# Patient Record
Sex: Female | Born: 1995 | Race: Black or African American | Hispanic: No | Marital: Single | Smoking: Former smoker
Health system: Southern US, Community
[De-identification: ages and names within clinical notes are randomized; demographics above are authoritative.]

## PROBLEM LIST (undated history)

## (undated) DIAGNOSIS — F4310 Post-traumatic stress disorder, unspecified: Secondary | ICD-10-CM

## (undated) DIAGNOSIS — F909 Attention-deficit hyperactivity disorder, unspecified type: Secondary | ICD-10-CM

## (undated) DIAGNOSIS — F32A Depression, unspecified: Secondary | ICD-10-CM

## (undated) DIAGNOSIS — F329 Major depressive disorder, single episode, unspecified: Secondary | ICD-10-CM

## (undated) DIAGNOSIS — F419 Anxiety disorder, unspecified: Secondary | ICD-10-CM

## (undated) DIAGNOSIS — K5909 Other constipation: Secondary | ICD-10-CM

## (undated) HISTORY — PX: WISDOM TOOTH EXTRACTION: SHX21

## (undated) HISTORY — DX: Anxiety disorder, unspecified: F41.9

## (undated) HISTORY — PX: EXTERNAL EAR SURGERY: SHX627

## (undated) HISTORY — PX: OTHER SURGICAL HISTORY: SHX169

## (undated) HISTORY — DX: Other constipation: K59.09

## (undated) HISTORY — DX: Depression, unspecified: F32.A

## (undated) HISTORY — DX: Post-traumatic stress disorder, unspecified: F43.10

---
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
## 2013-02-23 ENCOUNTER — Emergency Department (HOSPITAL_COMMUNITY)
Admission: EM | Admit: 2013-02-23 | Discharge: 2013-02-23 | Disposition: A | Discharge status: Home / Self Care | Payer: Medicaid Other | Attending: Emergency Medicine | Admitting: Emergency Medicine

## 2013-02-23 ENCOUNTER — Encounter (HOSPITAL_COMMUNITY): Payer: Self-pay | Admitting: Family Medicine

## 2013-02-23 DIAGNOSIS — Z79899 Other long term (current) drug therapy: Secondary | ICD-10-CM | POA: Insufficient documentation

## 2013-02-23 DIAGNOSIS — Z3202 Encounter for pregnancy test, result negative: Secondary | ICD-10-CM | POA: Insufficient documentation

## 2013-02-23 DIAGNOSIS — K649 Unspecified hemorrhoids: Secondary | ICD-10-CM

## 2013-02-23 DIAGNOSIS — F909 Attention-deficit hyperactivity disorder, unspecified type: Secondary | ICD-10-CM | POA: Insufficient documentation

## 2013-02-23 DIAGNOSIS — K644 Residual hemorrhoidal skin tags: Secondary | ICD-10-CM | POA: Insufficient documentation

## 2013-02-23 DIAGNOSIS — K5900 Constipation, unspecified: Secondary | ICD-10-CM | POA: Insufficient documentation

## 2013-02-23 DIAGNOSIS — K6289 Other specified diseases of anus and rectum: Secondary | ICD-10-CM | POA: Insufficient documentation

## 2013-02-23 DIAGNOSIS — J45909 Unspecified asthma, uncomplicated: Secondary | ICD-10-CM | POA: Insufficient documentation

## 2013-02-23 LAB — URINALYSIS, ROUTINE W REFLEX MICROSCOPIC
Bilirubin Urine: NEGATIVE
Glucose, UA: NEGATIVE mg/dL
Hgb urine dipstick: NEGATIVE
Leukocytes, UA: NEGATIVE
Nitrite: NEGATIVE
Specific Gravity, Urine: 1.039 — ABNORMAL HIGH (ref 1.005–1.030)
pH: 6 (ref 5.0–8.0)

## 2013-02-23 LAB — COMPREHENSIVE METABOLIC PANEL
ALT: 23 U/L (ref 0–35)
AST: 24 U/L (ref 0–37)
Albumin: 3.4 g/dL — ABNORMAL LOW (ref 3.5–5.2)
CO2: 27 mEq/L (ref 19–32)
Calcium: 9.3 mg/dL (ref 8.4–10.5)
Chloride: 103 mEq/L (ref 96–112)
Creatinine, Ser: 0.78 mg/dL (ref 0.50–1.10)
GFR calc Af Amer: >90 mL/min (ref >90)
GFR calc non Af Amer: >90 mL/min (ref >90)
Glucose, Bld: 78 mg/dL (ref 70–99)
Potassium: 3.8 mEq/L (ref 3.5–5.1)
Sodium: 140 mEq/L (ref 135–145)
Total Protein: 7.5 g/dL (ref 6.0–8.3)

## 2013-02-23 LAB — CBC WITH DIFFERENTIAL/PLATELET
Basophils Absolute: 0 10*3/uL (ref 0.0–0.1)
Basophils Relative: 0 % (ref 0–1)
Eosinophils Absolute: 0.2 10*3/uL (ref 0.0–0.7)
Eosinophils Relative: 2 % (ref 0–5)
Lymphocytes Relative: 19 % (ref 12–46)
MCH: 30.2 pg (ref 26.0–34.0)
MCHC: 33.9 g/dL (ref 30.0–36.0)
MCV: 89.1 fL (ref 78.0–100.0)
Monocytes Absolute: 0.8 10*3/uL (ref 0.1–1.0)
Monocytes Relative: 7 % (ref 3–12)
Neutrophils Relative %: 72 % (ref 43–77)
Platelets: 254 10*3/uL (ref 150–400)
RDW: 12.9 % (ref 11.5–15.5)
WBC: 11 10*3/uL — ABNORMAL HIGH (ref 4.0–10.5)

## 2013-02-23 NOTE — ED Provider Notes (Signed)
17 year old female has noted rectal pain for the last 2 weeks. Symptoms have been stable. She does have problems with chronic constipation and straining at her school. On exam, she is morbidly obese. External hemorrhoid is present which is not thrombosed it is moderately tender. She will be discharged with instructions use hydrocortisone cream and is given instructions on increasing fiber in her diet.  I saw and evaluated the patient, reviewed the resident's note and I agree with the findings and plan.   Dione Booze, MD 02/23/13 2148

## 2013-02-23 NOTE — ED Provider Notes (Signed)
History     CSN: [id number]  Arrival date & time 02/23/13  1514   First MD Initiated Contact with Patient 02/23/13 2003      Chief Complaint  Patient presents with  . Abscess    (Consider location/radiation/quality/duration/timing/severity/associated sxs/prior treatment) HPI Comments: 17 y.o. female who presents to the ER w/ the concern she has an abscess on her anus. She states she has had hard stools, no blood that has been noticed. No abdominal pain. No fevers.   Patient is a 17 y.o. female presenting with general illness. The history is provided by the patient.  Illness Location:  Anus Severity:  Mild Onset quality:  Gradual Timing:  Constant Progression:  Worsening Chronicity:  New Associated symptoms: no abdominal pain, no chest pain, no congestion, no cough, no diarrhea, no fatigue, no fever, no headaches, no rash, no vomiting and no wheezing     Past Medical History  Diagnosis Date  . Attention deficit hyperactivity disorder   . Chronic constipation   . Chest pain   . Asthma     Past Surgical History  Procedure Laterality Date  . External ear surgery      History reviewed. No pertinent family history.  History  Substance Use Topics  . Smoking status: Never Smoker   . Smokeless tobacco: Not on file  . Alcohol Use: No    OB History   Grav Para Term Preterm Abortions TAB SAB Ect Mult Living                  Review of Systems  Constitutional: Negative for fever, chills and fatigue.  HENT: Negative for congestion, facial swelling, drooling, neck pain and dental problem.   Eyes: Negative for pain, discharge and itching.  Respiratory: Negative for cough, choking, wheezing and stridor.   Cardiovascular: Negative for chest pain.  Gastrointestinal: Negative for vomiting, abdominal pain and diarrhea.  Endocrine: Negative for cold intolerance and heat intolerance.  Genitourinary: Negative for vaginal discharge, difficulty urinating and vaginal pain.  Skin:  Negative for pallor and rash.  Neurological: Negative for dizziness, light-headedness and headaches.  Psychiatric/Behavioral: Negative for behavioral problems and agitation.    Allergies  Review of patient's allergies indicates no known allergies.  Home Medications   Current Outpatient Rx  Name  Route  Sig  Dispense  Refill  . albuterol (PROVENTIL HFA;VENTOLIN HFA) 108 (90 BASE) MCG/ACT inhaler   Inhalation   Inhale 2 puffs into the lungs every 6 (six) hours as needed for wheezing or shortness of breath.         . cetirizine (ZYRTEC) 10 MG tablet   Oral   Take 10 mg by mouth daily.           . cholecalciferol (VITAMIN D) 1000 UNITS tablet   Oral   Take 1,000 Units by mouth daily.           . methylphenidate (CONCERTA) 36 MG CR tablet   Oral   Take 36 mg by mouth every morning.           . norgestimate-ethinyl estradiol (ORTHO-CYCLEN,SPRINTEC,PREVIFEM) 0.25-35 MG-MCG tablet   Oral   Take 1 tablet by mouth daily.           Marland Kitchen omeprazole (PRILOSEC) 20 MG capsule   Oral   Take 20 mg by mouth daily.             BP 128/69  Pulse 68  Temp(Src) 98.4 F (36.9 C) (Oral)  Resp 19  Ht  5' 8.5" (1.74 m)  Wt 303 lb (137.44 kg)  BMI 45.4 kg/m2  SpO2 99%  LMP 02/03/2013  Physical Exam  Constitutional: She is oriented to person, place, and time. She appears well-developed. No distress.  HENT:  Head: Normocephalic and atraumatic.  Eyes: Pupils are equal, round, and reactive to light. Right eye exhibits no discharge. Left eye exhibits no discharge.  Neck: Neck supple. No tracheal deviation present.  Cardiovascular: Normal rate.  Exam reveals no gallop and no friction rub.   Pulmonary/Chest: No stridor. No respiratory distress. She has no wheezes.  Abdominal: Soft. She exhibits no distension. There is no tenderness. There is no rebound.  Genitourinary:  Normal rectal tone. External hemorrhoid, not thrombosed. No internal hemorrhoid appreciated. No blood in stool     Musculoskeletal: She exhibits no edema and no tenderness.  Neurological: She is alert and oriented to person, place, and time.  Skin: Skin is warm. She is not diaphoretic.    ED Course  Procedures (including critical care time)  Labs Reviewed  CBC WITH DIFFERENTIAL - Abnormal; Notable for the following:    WBC 11.0 (*)    Neutro Abs 7.9 (*)    All other components within normal limits  COMPREHENSIVE METABOLIC PANEL - Abnormal; Notable for the following:    Albumin 3.4 (*)    Alkaline Phosphatase 120 (*)    All other components within normal limits  URINALYSIS, ROUTINE W REFLEX MICROSCOPIC - Abnormal; Notable for the following:    APPearance HAZY (*)    Specific Gravity, Urine 1.039 (*)    All other components within normal limits  POCT PREGNANCY, URINE   No results found.    MDM  Pt with hemorrhoid -- no signs of abscess.. External. Not thrombosed. Have given pt instructions on how to tx. Have told to f/u with pcp, have encouraged high fiber diet. Have given precautions and indications to return to the ER.   1. Hemorrhoid           Dorothe Elmore Donette Larry, MD 02/25/13 [phone]

## 2013-02-23 NOTE — ED Notes (Signed)
Pt reassessed and states vomiting, diarrhea, abdominal pain, knee pain, and abscess to buttocks.

## 2013-02-23 NOTE — ED Notes (Signed)
Per pt abscess on her bottom and some N,V,D

## 2013-02-23 NOTE — ED Notes (Signed)
The patient is AOx4 and comfortable with the discharge instructions. 

## 2014-02-05 ENCOUNTER — Emergency Department (HOSPITAL_COMMUNITY): Payer: Medicaid Other

## 2014-02-05 ENCOUNTER — Encounter (HOSPITAL_COMMUNITY): Payer: Self-pay | Admitting: Emergency Medicine

## 2014-02-05 ENCOUNTER — Emergency Department (HOSPITAL_COMMUNITY)
Admission: EM | Admit: 2014-02-05 | Discharge: 2014-02-05 | Disposition: A | Discharge status: Home / Self Care | Payer: Medicaid Other | Attending: Emergency Medicine | Admitting: Emergency Medicine

## 2014-02-05 DIAGNOSIS — Z3202 Encounter for pregnancy test, result negative: Secondary | ICD-10-CM | POA: Insufficient documentation

## 2014-02-05 DIAGNOSIS — R0789 Other chest pain: Secondary | ICD-10-CM

## 2014-02-05 DIAGNOSIS — F909 Attention-deficit hyperactivity disorder, unspecified type: Secondary | ICD-10-CM | POA: Insufficient documentation

## 2014-02-05 DIAGNOSIS — Z8719 Personal history of other diseases of the digestive system: Secondary | ICD-10-CM | POA: Insufficient documentation

## 2014-02-05 DIAGNOSIS — R071 Chest pain on breathing: Secondary | ICD-10-CM | POA: Insufficient documentation

## 2014-02-05 DIAGNOSIS — J45909 Unspecified asthma, uncomplicated: Secondary | ICD-10-CM | POA: Insufficient documentation

## 2014-02-05 DIAGNOSIS — Z79899 Other long term (current) drug therapy: Secondary | ICD-10-CM | POA: Insufficient documentation

## 2014-02-05 DIAGNOSIS — R1110 Vomiting, unspecified: Secondary | ICD-10-CM | POA: Insufficient documentation

## 2014-02-05 LAB — URINE MICROSCOPIC-ADD ON

## 2014-02-05 LAB — URINALYSIS, ROUTINE W REFLEX MICROSCOPIC
Glucose, UA: NEGATIVE mg/dL
Ketones, ur: 15 mg/dL — AB
Leukocytes, UA: NEGATIVE
Nitrite: NEGATIVE
Protein, ur: 30 mg/dL — AB
Specific Gravity, Urine: 1.037 — ABNORMAL HIGH (ref 1.005–1.030)
Urobilinogen, UA: 4 mg/dL — ABNORMAL HIGH (ref 0.0–1.0)
pH: 7.5 (ref 5.0–8.0)

## 2014-02-05 LAB — COMPREHENSIVE METABOLIC PANEL WITH GFR
Albumin: 3.4 g/dL — ABNORMAL LOW (ref 3.5–5.2)
Alkaline Phosphatase: 111 U/L (ref 39–117)
BUN: 12 mg/dL (ref 6–23)
Calcium: 8.8 mg/dL (ref 8.4–10.5)
Creatinine, Ser: 0.92 mg/dL (ref 0.50–1.10)
GFR calc Af Amer: >90 mL/min (ref >90)
Glucose, Bld: 91 mg/dL (ref 70–99)
Potassium: 4.2 meq/L (ref 3.7–5.3)
Total Protein: 7 g/dL (ref 6.0–8.3)

## 2014-02-05 LAB — CBC WITH DIFFERENTIAL/PLATELET
Basophils Absolute: 0 10*3/uL (ref 0.0–0.1)
Basophils Relative: 0 % (ref 0–1)
Eosinophils Absolute: 0.1 K/uL (ref 0.0–0.7)
Eosinophils Relative: 2 % (ref 0–5)
HCT: 39.5 % (ref 36.0–46.0)
Hemoglobin: 13.2 g/dL (ref 12.0–15.0)
Lymphocytes Relative: 22 % (ref 12–46)
Lymphs Abs: 1.9 10*3/uL (ref 0.7–4.0)
MCH: 30.5 pg (ref 26.0–34.0)
MCHC: 33.4 g/dL (ref 30.0–36.0)
MCV: 91.2 fL (ref 78.0–100.0)
Monocytes Absolute: 0.6 10*3/uL (ref 0.1–1.0)
Monocytes Relative: 7 % (ref 3–12)
Neutro Abs: 6.1 10*3/uL (ref 1.7–7.7)
Neutrophils Relative %: 69 % (ref 43–77)
Platelets: 250 K/uL (ref 150–400)
RBC: 4.33 MIL/uL (ref 3.87–5.11)
RDW: 12.8 % (ref 11.5–15.5)
WBC: 8.7 10*3/uL (ref 4.0–10.5)

## 2014-02-05 LAB — COMPREHENSIVE METABOLIC PANEL
ALT: 41 U/L — ABNORMAL HIGH (ref 0–35)
AST: 56 U/L — ABNORMAL HIGH (ref 0–37)
CO2: 24 mEq/L (ref 19–32)
Chloride: 104 mEq/L (ref 96–112)
GFR calc non Af Amer: 90 mL/min — ABNORMAL LOW (ref >90)
Sodium: 140 mEq/L (ref 137–147)
Total Bilirubin: 0.5 mg/dL (ref 0.3–1.2)

## 2014-02-05 LAB — LIPASE, BLOOD: Lipase: 29 U/L (ref 11–59)

## 2014-02-05 LAB — PREGNANCY, URINE: Preg Test, Ur: NEGATIVE

## 2014-02-05 MED ORDER — IBUPROFEN 800 MG PO TABS
800.0000 mg | ORAL_TABLET | Freq: Once | ORAL | Status: AC
  Administered 2014-02-05: 800 mg via ORAL
  Filled 2014-02-05: qty 1

## 2014-02-05 MED ORDER — SODIUM CHLORIDE 0.9 % IV BOLUS (SEPSIS)
1000.0000 mL | Freq: Once | INTRAVENOUS | Status: AC
  Administered 2014-02-05: 1000 mL via INTRAVENOUS

## 2014-02-05 MED ORDER — ONDANSETRON HCL 4 MG/2ML IJ SOLN
4.0000 mg | Freq: Once | INTRAMUSCULAR | Status: AC
  Administered 2014-02-05: 4 mg via INTRAVENOUS
  Filled 2014-02-05: qty 2

## 2014-02-05 MED ORDER — ONDANSETRON 4 MG PO TBDP
8.0000 mg | ORAL_TABLET | Freq: Once | ORAL | Status: AC
  Administered 2014-02-05: 8 mg via ORAL
  Filled 2014-02-05: qty 2

## 2014-02-05 MED ORDER — PROMETHAZINE HCL 25 MG PO TABS: 25.0000 mg | ORAL_TABLET | Freq: Three times a day (TID) | ORAL | Status: DC | PRN

## 2014-02-05 MED ORDER — HYDROCODONE-ACETAMINOPHEN 5-325 MG PO TABS
1.0000 | ORAL_TABLET | Freq: Once | ORAL | Status: AC
  Administered 2014-02-05: 1 via ORAL
  Filled 2014-02-05: qty 1

## 2014-02-05 MED ORDER — HYDROCODONE-ACETAMINOPHEN 5-325 MG PO TABS: 1.0000 | ORAL_TABLET | Freq: Four times a day (QID) | ORAL | Status: DC | PRN

## 2014-02-05 MED ORDER — IBUPROFEN 800 MG PO TABS: 800.0000 mg | ORAL_TABLET | Freq: Three times a day (TID) | ORAL | Status: DC | PRN

## 2014-02-05 NOTE — ED Provider Notes (Signed)
CSN: [id number]     Arrival date & time 02/05/14  [id number] History   First MD Initiated Contact with Patient 02/05/14 [phone]     Chief Complaint  Patient presents with  . Chest Pain     (Consider location/radiation/quality/duration/timing/severity/associated sxs/prior Treatment) HPI Patient presents to the emergency department with right-sided chest discomfort that her to 4 hours ago.  Patient, states, that she's having lower rib pain on the right.  She denies diarrhea, headache, blurred vision, weakness, numbness, dizziness, back pain shortness of breath, dysuria abdominal pain, fever, cough, runny nose, sore throat, rash, or syncope.  Patient, states, that she awoke this morning with sharp, right-sided chest pain in the lower part of her chest.  Patient, states it hurts more when she moves, takes a deep breath or coughs.  Patient, states the symptoms have been persistent Past Medical History  Diagnosis Date  . Attention deficit hyperactivity disorder   . Chronic constipation   . Chest pain   . Asthma    Past Surgical History  Procedure Laterality Date  . External ear surgery     No family history on file. History  Substance Use Topics  . Smoking status: Never Smoker   . Smokeless tobacco: Not on file  . Alcohol Use: No   OB History   Grav Para Term Preterm Abortions TAB SAB Ect Mult Living                 Review of Systems  All other systems negative except as documented in the HPI. All pertinent positives and negatives as reviewed in the HPI.  Allergies  Review of patient's allergies indicates no known allergies.  Home Medications   Prior to Admission medications   Medication Sig Start Date End Date Taking? Authorizing Provider  albuterol (PROVENTIL HFA;VENTOLIN HFA) 108 (90 BASE) MCG/ACT inhaler Inhale 2 puffs into the lungs every 6 (six) hours as needed for wheezing or shortness of breath.   Yes Historical Provider, MD  cetirizine (ZYRTEC) 10 MG tablet Take 10 mg by mouth  daily.     Yes Historical Provider, MD  cholecalciferol (VITAMIN D) 1000 UNITS tablet Take 1,000 Units by mouth daily.     Yes Historical Provider, MD  methylphenidate (CONCERTA) 36 MG CR tablet Take 36 mg by mouth every morning.     Yes Historical Provider, MD  norgestimate-ethinyl estradiol (ORTHO-CYCLEN,SPRINTEC,PREVIFEM) 0.25-35 MG-MCG tablet Take 1 tablet by mouth daily.     Yes Historical Provider, MD  omeprazole (PRILOSEC) 20 MG capsule Take 20 mg by mouth daily.     Yes Historical Provider, MD   BP 134/70  Pulse 63  Temp(Src) 97.4 F (36.3 C) (Oral)  Resp 13  Ht 5\' 7"  (1.702 m)  Wt 330 lb (149.687 kg)  BMI 51.67 kg/m2  SpO2 100%  LMP 01/29/2014 Physical Exam  Nursing note and vitals reviewed. Constitutional: She is oriented to person, place, and time. She appears well-developed and well-nourished. No distress.  HENT:  Head: Normocephalic and atraumatic.  Mouth/Throat: Oropharynx is clear and moist.  Eyes: Conjunctivae are normal. Pupils are equal, round, and reactive to light.  Neck: Normal range of motion. Neck supple.  Cardiovascular: Normal rate, regular rhythm and normal heart sounds.  Exam reveals no gallop and no friction rub.   No murmur heard. Pulmonary/Chest: Effort normal. No respiratory distress. She has no wheezes. She has no rales. She exhibits tenderness.  Abdominal: Soft. Bowel sounds are normal. She exhibits no distension. There is no tenderness. There  is no rebound.  Musculoskeletal: She exhibits no edema.  Neurological: She is alert and oriented to person, place, and time. She exhibits normal muscle tone. Coordination normal.  Skin: Skin is warm and dry. No rash noted.  Psychiatric: She has a normal mood and affect. Her behavior is normal. Judgment and thought content normal.    ED Course  Procedures (including critical care time) Labs Review Labs Reviewed  COMPREHENSIVE METABOLIC PANEL - Abnormal; Notable for the following:    Albumin 3.4 (*)    AST  56 (*)    ALT 41 (*)    GFR calc non Af Amer 90 (*)    All other components within normal limits  CBC WITH DIFFERENTIAL  LIPASE, BLOOD    Imaging Review Dg Chest 2 View  02/05/2014   CLINICAL DATA:  Chest pain.  EXAM: CHEST  2 VIEW  COMPARISON:  DG CHEST 2 VIEW dated 09/02/2011  FINDINGS: Mediastinum and hilar structures are normal. On lateral view increased density noted over the posterior chest. This may just represent overlying prominent soft tissues however focal pulmonary infiltrate or posterior chest mass cannot be excluded. A standard upright PA and lateral chest x-ray suggested for further evaluation. Borderline cardiomegaly, normal pulmonary vascularity. No pleural effusion or pneumothorax. No acute bony abnormality.  IMPRESSION: 1. Increased density noted along the posterior chest. This may just represent overlying soft tissues. However a pulmonary infiltrate or soft tissue chest wall mass cannot be excluded. A standard upright PA and lateral chest x-ray is suggested for further evaluation. 2. Borderline cardiomegaly.  This may be related to AP technique.   Electronically Signed   By: Maisie Fushomas  Register   On: 02/05/2014 08:54     EKG Interpretation   Date/Time:  Sunday Feb 05 2014 06:56:22 EDT Ventricular Rate:  62 PR Interval:  164 QRS Duration: 90 QT Interval:  426 QTC Calculation: 432 R Axis:   52 Text Interpretation:  Normal sinus rhythm Normal ECG ED PHYSICIAN  INTERPRETATION AVAILABLE IN [facility] Confirmed by TEST, Record  ([id number]) on 02/07/2014 7:26:29 AM       Patient most likely has chest wall pain, based on her physical exam findings, and history of present illness.  The patient had no cough, or fever.  No recent URI symptoms.  Patient does not have any abdominal pain.  She has had some vomiting here in the emergency department.  He was given IV fluids and antiemetics and is feeling better at this time. able to tolerate oral fluids patient is given her test results,  and all questions were answered.  The patient.  Voices an understanding of plan to return here as needed and to follow up with her primary care Dr. for recheck  Carlyle Dollyhristopher W Tarren Velardi, PA-C 02/08/14 [phone]

## 2014-02-05 NOTE — ED Notes (Addendum)
Pt c/o left upper chest pain that started 4 hours ago and upper and lower back pain. Pt sts her back pain is separate than her chest pain. sts everything started while she was sleeping and it woke her up from sleep. sts she vomited twice after waking up, denies nausea now. Pt reluctant to answer questions because she is trying to watch a tv show. Father at bedside trying to get pt to answer questions. Denies sob. Nad, skin warm and dry, resp e/u.

## 2014-02-05 NOTE — ED Notes (Signed)
Pt requesting nausea medicine. PA notified.

## 2014-02-05 NOTE — ED Notes (Signed)
Attempted to gain IV access, unsuccessful. Pt reports she is unable to give a urine sample at this time.

## 2014-02-05 NOTE — Discharge Instructions (Signed)
Return here as needed. Use ice and heat on your ribs

## 2014-02-05 NOTE — ED Notes (Signed)
The pt is c/o rt sided chest pain for 4 hours that radiates into her rt posterior chest.  Sob nv .  None now.  The pain increases with respiration.  lmp this past week

## 2014-02-09 NOTE — ED Provider Notes (Signed)
Medical screening examination/treatment/procedure(s) were performed by non-physician practitioner and as supervising physician I was immediately available for consultation/collaboration.   EKG Interpretation   Date/Time:  Sunday Feb 05 2014 06:56:22 EDT Ventricular Rate:  62 PR Interval:  164 QRS Duration: 90 QT Interval:  426 QTC Calculation: 432 R Axis:   52 Text Interpretation:  Normal sinus rhythm Normal ECG ED PHYSICIAN  INTERPRETATION AVAILABLE IN [facility] Confirmed by TEST, Record  ([id number]) on 02/07/2014 7:26:29 AM        Gavin PoundMichael Y. Oletta LamasGhim, MD 02/09/14 [id number]

## 2014-02-16 ENCOUNTER — Other Ambulatory Visit: Payer: Self-pay | Admitting: Pediatrics

## 2014-02-16 ENCOUNTER — Ambulatory Visit
Admission: RE | Admit: 2014-02-16 | Discharge: 2014-02-16 | Disposition: A | Discharge status: Home / Self Care | Payer: Medicaid Other | Source: Ambulatory Visit | Attending: Pediatrics | Admitting: Pediatrics

## 2014-02-16 DIAGNOSIS — R918 Other nonspecific abnormal finding of lung field: Secondary | ICD-10-CM

## 2016-03-10 ENCOUNTER — Encounter (HOSPITAL_COMMUNITY): Payer: Self-pay | Admitting: Emergency Medicine

## 2016-03-10 ENCOUNTER — Emergency Department (HOSPITAL_COMMUNITY)
Admission: EM | Admit: 2016-03-10 | Discharge: 2016-03-10 | Disposition: A | Discharge status: Home / Self Care | Payer: Medicaid Other | Attending: Emergency Medicine | Admitting: Emergency Medicine

## 2016-03-10 ENCOUNTER — Emergency Department (HOSPITAL_COMMUNITY): Payer: Medicaid Other

## 2016-03-10 DIAGNOSIS — S0990XA Unspecified injury of head, initial encounter: Secondary | ICD-10-CM | POA: Diagnosis not present

## 2016-03-10 DIAGNOSIS — Y92410 Unspecified street and highway as the place of occurrence of the external cause: Secondary | ICD-10-CM | POA: Diagnosis not present

## 2016-03-10 DIAGNOSIS — J45909 Unspecified asthma, uncomplicated: Secondary | ICD-10-CM | POA: Insufficient documentation

## 2016-03-10 DIAGNOSIS — R531 Weakness: Secondary | ICD-10-CM | POA: Insufficient documentation

## 2016-03-10 DIAGNOSIS — Y9389 Activity, other specified: Secondary | ICD-10-CM | POA: Diagnosis not present

## 2016-03-10 DIAGNOSIS — S29001A Unspecified injury of muscle and tendon of front wall of thorax, initial encounter: Secondary | ICD-10-CM | POA: Insufficient documentation

## 2016-03-10 DIAGNOSIS — Z8659 Personal history of other mental and behavioral disorders: Secondary | ICD-10-CM | POA: Insufficient documentation

## 2016-03-10 DIAGNOSIS — Z3202 Encounter for pregnancy test, result negative: Secondary | ICD-10-CM | POA: Diagnosis not present

## 2016-03-10 DIAGNOSIS — Z79899 Other long term (current) drug therapy: Secondary | ICD-10-CM | POA: Diagnosis not present

## 2016-03-10 DIAGNOSIS — Y998 Other external cause status: Secondary | ICD-10-CM | POA: Diagnosis not present

## 2016-03-10 DIAGNOSIS — Z8719 Personal history of other diseases of the digestive system: Secondary | ICD-10-CM | POA: Diagnosis not present

## 2016-03-10 LAB — CBG MONITORING, ED: Glucose-Capillary: 72 mg/dL (ref 65–99)

## 2016-03-10 LAB — POC URINE PREG, ED: PREG TEST UR: NEGATIVE
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
## 2016-03-10 NOTE — Discharge Instructions (Signed)
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
## 2016-03-10 NOTE — ED Notes (Signed)
Checked patient blood sugar it was 72 notified RN of blood sugar 

## 2016-03-10 NOTE — ED Notes (Signed)
MD at bedside. 

## 2016-06-13 ENCOUNTER — Encounter (HOSPITAL_COMMUNITY): Payer: Self-pay | Admitting: Physical Medicine and Rehabilitation

## 2016-06-13 ENCOUNTER — Emergency Department (HOSPITAL_COMMUNITY)
Admission: EM | Admit: 2016-06-13 | Discharge: 2016-06-13 | Disposition: A | Discharge status: Home / Self Care | Payer: Medicaid Other | Attending: Emergency Medicine | Admitting: Emergency Medicine

## 2016-06-13 DIAGNOSIS — T162XXA Foreign body in left ear, initial encounter: Secondary | ICD-10-CM | POA: Insufficient documentation

## 2016-06-13 DIAGNOSIS — J45909 Unspecified asthma, uncomplicated: Secondary | ICD-10-CM | POA: Insufficient documentation

## 2016-06-13 DIAGNOSIS — Y939 Activity, unspecified: Secondary | ICD-10-CM | POA: Diagnosis not present

## 2016-06-13 DIAGNOSIS — F909 Attention-deficit hyperactivity disorder, unspecified type: Secondary | ICD-10-CM | POA: Diagnosis not present

## 2016-06-13 DIAGNOSIS — X58XXXA Exposure to other specified factors, initial encounter: Secondary | ICD-10-CM | POA: Diagnosis not present

## 2016-06-13 DIAGNOSIS — Y999 Unspecified external cause status: Secondary | ICD-10-CM | POA: Diagnosis not present

## 2016-06-13 DIAGNOSIS — Y929 Unspecified place or not applicable: Secondary | ICD-10-CM | POA: Diagnosis not present

## 2016-06-13 MED ORDER — DOCUSATE SODIUM 50 MG/5ML PO LIQD
10.0000 mg | Freq: Once | ORAL | Status: AC
  Administered 2016-06-13: 10 mg via OTIC
  Filled 2016-06-13: qty 10

## 2016-06-13 MED ORDER — CIPROFLOXACIN-DEXAMETHASONE 0.3-0.1 % OT SUSP: 4.0000 [drp] | Freq: Two times a day (BID) | OTIC | 0 refills | Status: AC

## 2016-06-13 NOTE — Discharge Instructions (Signed)
There was a bug removed from your ear today. Use 4 drops of the eardrops in the left ear twice a day for 7 days.

## 2016-06-13 NOTE — ED Provider Notes (Signed)
[facility]EM[facility]Y DEPT Provider Note   CSN: [id number] Arrival date & time: 06/13/16  1208  By signing my name below, I, Nelwyn Salisbury, attest that this documentation has been prepared under the direction and in the presence of non-physician practitioner, Harolyn Rutherford, PA-C. Electronically Signed: Nelwyn Salisbury, Scribe. 06/13/2016. 12:18 PM.  History   Chief Complaint Chief Complaint  Patient presents with  . Foreign Body in Ear   The history is provided by the patient. No language interpreter was used.     HPI Comments:  Kathryn Andrews is a 20 y.o. female who presents to the Emergency Department complaining of a large organic foreign body in her left ear first noticed last night. She reports that a bug crawled in her ear, and she has been unable to get it out. Pt notes she has tried to drown the insect and remove it. She succeeded in killing the insect, but could not remove it. Pt reports associated minor left ear pain.    Past Medical History:  Diagnosis Date  . Asthma   . Attention deficit hyperactivity disorder   . Chest pain   . Chronic constipation     Patient Active Problem List   Diagnosis Date Noted  . Chronic constipation   . Chest pain     Past Surgical History:  Procedure Laterality Date  . EXTERNAL EAR SURGERY      OB History    No data available       Home Medications    Prior to Admission medications   Medication Sig Start Date End Date Taking? Authorizing Provider  albuterol (PROVENTIL HFA;VENTOLIN HFA) 108 (90 BASE) MCG/ACT inhaler Inhale 2 puffs into the lungs every 6 (six) hours as needed for wheezing or shortness of breath.    Historical Provider, MD  cetirizine (ZYRTEC) 10 MG tablet Take 10 mg by mouth daily.      Historical Provider, MD  ciprofloxacin-dexamethasone (CIPRODEX) otic suspension Place 4 drops into the left ear 2 (two) times daily. 06/13/16 06/20/16  Shawn C Joy, PA-C  ibuprofen (ADVIL,MOTRIN) 800 MG tablet Take 1 tablet (800 mg total) by  mouth every 8 (eight) hours as needed. 02/05/14   Charlestine Night, PA-C    Family History No family history on file.  Social History Social History  Substance Use Topics  . Smoking status: Never Smoker  . Smokeless tobacco: Never Used  . Alcohol use No     Allergies   Review of patient's allergies indicates no known allergies.   Review of Systems Review of Systems  HENT: Positive for ear pain.   Respiratory: Negative for cough.   Neurological: Negative for dizziness.     Physical Exam Updated Vital Signs BP 134/92 (BP Location: Right Arm)   Pulse 72   Temp 98.3 F (36.8 C) (Oral)   Resp 18   SpO2 99%   Physical Exam  Constitutional: She appears well-developed and well-nourished. No distress.  HENT:  Head: Normocephalic and atraumatic.  Left Ear: Tympanic membrane normal. A foreign body is present.  Insect in left ear canal, seems to be somewhat embedded in cerumen. TM intact.  Eyes: Conjunctivae are normal.  Neck: Neck supple.  Cardiovascular: Normal rate and regular rhythm.   Pulmonary/Chest: Effort normal.  Neurological: She is alert.  Skin: Skin is warm and dry. She is not diaphoretic.  Psychiatric: She has a normal mood and affect. Her behavior is normal.  Nursing note and vitals reviewed.    ED Treatments / Results  DIAGNOSTIC  STUDIES:  Oxygen Saturation is 99% on RA, normal by my interpretation.    COORDINATION OF CARE:  5:11 PM Discussed treatment plan with pt at bedside and pt agreed to plan.  Labs (all labs ordered are listed, but only abnormal results are displayed) Labs Reviewed - No data to display  EKG  EKG Interpretation None       Radiology No results found.  Procedures .Ear Cerumen Removal Date/Time: 06/13/2016 1:41 PM Performed by: Anselm PancoastJOY, SHAWN C Authorized by: Anselm PancoastJOY, SHAWN C   Consent:    Consent obtained:  Verbal   Consent given by:  Patient   Risks discussed:  Bleeding, dizziness, infection, incomplete removal, pain  and TM perforation   Alternatives discussed:  No treatment Procedure details:    Location:  L ear   Procedure type: irrigation   Post-procedure details:    Inspection:  TM intact   Hearing quality:  Normal   Patient tolerance of procedure:  Tolerated well, no immediate complications .Foreign Body Removal Date/Time: 06/13/2016 1:41 PM Performed by: Anselm PancoastJOY, SHAWN C Authorized by: Anselm PancoastJOY, SHAWN C  Consent: Verbal consent obtained. Risks and benefits: risks, benefits and alternatives were discussed Consent given by: patient Body area: ear Localization method: visualized Removal mechanism: alligator forceps Complexity: simple 1 objects recovered. Objects recovered: Cockroach Post-procedure assessment: foreign body removed Patient tolerance: Patient tolerated the procedure well with no immediate complications Comments: This removal was performed by Roxy Horsemanobert Browning, PA-C.   (including critical care time)  Medications Ordered in ED Medications  docusate (COLACE) 50 MG/5ML liquid 10 mg (10 mg Both Ears Given 06/13/16 1246)     Initial Impression / Assessment and Plan / ED Course  I have reviewed the triage vital signs and the nursing notes.  Pertinent labs & imaging results that were available during my care of the patient were reviewed by me and considered in my medical decision making (see chart for details).  Clinical Course   Patient with an insect embedded in the left ear since last night. Removed successfully. Patient treated with eardrops for the next 7 days to avoid infection. Patient to return to the ED for any further issues.  Vitals:   06/13/16 1212 06/13/16 1417  BP: 134/92 143/90  Pulse: 72 80  Resp: 18 17  Temp: 98.3 F (36.8 C)   TempSrc: Oral   SpO2: 99% 100%     Final Clinical Impressions(s) / ED Diagnoses   Final diagnoses:  Foreign body in ear, left, initial encounter    New Prescriptions Discharge Medication List as of 06/13/2016  2:13 PM    START taking  these medications   Details  ciprofloxacin-dexamethasone (CIPRODEX) otic suspension Place 4 drops into the left ear 2 (two) times daily., Starting Fri 06/13/2016, Until Fri 06/20/2016, Print      I personally performed the services described in this documentation, which was scribed in my presence. The recorded information has been reviewed and is accurate.    Anselm PancoastShawn C Joy, PA-C 06/13/16 1711    Loren Raceravid Yelverton, MD 06/18/16 [phone]

## 2016-06-13 NOTE — ED Notes (Signed)
Pt ambulated to room from waiting room, tolerated well. 

## 2016-06-13 NOTE — ED Triage Notes (Signed)
Pt states she has roach in L ear. Reports increased pain this morning.

## 2016-06-19 ENCOUNTER — Encounter (HOSPITAL_COMMUNITY): Payer: Self-pay | Admitting: *Deleted

## 2016-06-19 ENCOUNTER — Emergency Department (HOSPITAL_COMMUNITY)
Admission: EM | Admit: 2016-06-19 | Discharge: 2016-06-19 | Disposition: A | Discharge status: Home / Self Care | Payer: Medicaid Other | Attending: Emergency Medicine | Admitting: Emergency Medicine

## 2016-06-19 DIAGNOSIS — J45909 Unspecified asthma, uncomplicated: Secondary | ICD-10-CM | POA: Insufficient documentation

## 2016-06-19 DIAGNOSIS — J029 Acute pharyngitis, unspecified: Secondary | ICD-10-CM | POA: Diagnosis present

## 2016-06-19 DIAGNOSIS — J069 Acute upper respiratory infection, unspecified: Secondary | ICD-10-CM

## 2016-06-19 LAB — RAPID STREP SCREEN (MED CTR MEBANE ONLY): Streptococcus, Group A Screen (Direct): NEGATIVE

## 2016-06-19 MED ORDER — BENZONATATE 100 MG PO CAPS: 200.0000 mg | ORAL_CAPSULE | Freq: Two times a day (BID) | ORAL | 0 refills | Status: DC | PRN

## 2016-06-19 NOTE — ED Triage Notes (Signed)
Pt was seen here recently for having a rach in her ear.  This am she felt that something was in her ear.  I looked and it appears to only be ear wax

## 2016-06-19 NOTE — ED Notes (Signed)
Pt states that since she is here she wants to be evaluated for a cough

## 2016-06-19 NOTE — ED Provider Notes (Signed)
[facility] Provider Note   CSN: [id number] Arrival date & time: 06/19/16  1918   By signing my name below, I, [name], attest that this documentation has been prepared under the direction and in the presence of  Roxy Horsemanobert Vernetta Dizdarevic, PA-C. Electronically Signed: [name], ED Scribe. 06/19/16. 9:25 PM.   History   Chief Complaint Chief Complaint  Patient presents with  . Sore Throat     The history is provided by the patient. No language interpreter was used.   HPI Comments:  Kathryn Andrews is a 20 y.o. female who presents to the Emergency Department complaining of worsening, moderate sore throat onset 4 days ago. She notes an associated cough. Pt denies a fever or any sick contact. She has taken cough medicine with little relief. Pt denies any other complaints at this time.    Past Medical History:  Diagnosis Date  . Asthma   . Attention deficit hyperactivity disorder   . Chest pain   . Chronic constipation     Patient Active Problem List   Diagnosis Date Noted  . Chronic constipation   . Chest pain     Past Surgical History:  Procedure Laterality Date  . EXTERNAL EAR SURGERY      OB History    No data available       Home Medications    Prior to Admission medications   Medication Sig Start Date End Date Taking? Authorizing Provider  albuterol (PROVENTIL HFA;VENTOLIN HFA) 108 (90 BASE) MCG/ACT inhaler Inhale 2 puffs into the lungs every 6 (six) hours as needed for wheezing or shortness of breath.    Historical Provider, MD  cetirizine (ZYRTEC) 10 MG tablet Take 10 mg by mouth daily.      Historical Provider, MD  ciprofloxacin-dexamethasone (CIPRODEX) otic suspension Place 4 drops into the left ear 2 (two) times daily. 06/13/16 06/20/16  Shawn C Joy, PA-C  ibuprofen (ADVIL,MOTRIN) 800 MG tablet Take 1 tablet (800 mg total) by mouth every 8 (eight) hours as needed. 02/05/14   Charlestine Nighthristopher Lawyer, PA-C    Family History No family history on  file.  Social History Social History  Substance Use Topics  . Smoking status: Never Smoker  . Smokeless tobacco: Never Used  . Alcohol use No     Allergies   Review of patient's allergies indicates no known allergies.   Review of Systems Review of Systems  Constitutional: Negative for fever.  HENT: Positive for sore throat.   Respiratory: Positive for cough.   All other systems reviewed and are negative.    Physical Exam Updated Vital Signs BP 139/99 (BP Location: Right Arm)   Pulse 84   Temp 98.4 F (36.9 C) (Oral)   Wt (!) 347 lb (157.4 kg)   SpO2 99%   BMI 54.35 kg/m   Physical Exam Physical Exam  Constitutional: Pt  is oriented to person, place, and time. Appears well-developed and well-nourished. No distress.  HENT:  Head: Normocephalic and atraumatic.  Right Ear: Tympanic membrane, external ear and ear canal normal.  Left Ear: Tympanic membrane, external ear and ear canal normal.  Nose: Mucosal edema and mild rhinorrhea present. No epistaxis. Right sinus exhibits no maxillary sinus tenderness and no frontal sinus tenderness. Left sinus exhibits no maxillary sinus tenderness and no frontal sinus tenderness.  Mouth/Throat: Uvula is midline and mucous membranes are normal. Mucous membranes are not pale and not cyanotic. No oropharyngeal exudate, mild posterior oropharyngeal edema, mild posterior oropharyngeal erythema, no tonsillar abscesses.  Eyes: Conjunctivae are normal. Pupils are equal, round, and reactive to light.  Neck: Normal range of motion and full passive range of motion without pain.  Cardiovascular: Normal rate and intact distal pulses.   Pulmonary/Chest: Effort normal and breath sounds normal. No stridor.  Clear and equal breath sounds without focal wheezes, rhonchi, rales  Abdominal: Soft. Bowel sounds are normal. There is no tenderness.  Musculoskeletal: Normal range of motion.  Lymphadenopathy:    Pthas no cervical adenopathy.  Neurological:  Pt is alert and oriented to person, place, and time.  Skin: Skin is warm and dry. No rash noted. Pt is not diaphoretic.  Psychiatric: Normal mood and affect.  Nursing note and vitals reviewed.    ED Treatments / Results  DIAGNOSTIC STUDIES:  Oxygen Saturation is 99% on RA, normal by my interpretation.    COORDINATION OF CARE:  9:26 PM Discussed treatment plan with pt at bedside and pt agreed to plan.  Labs (all labs ordered are listed, but only abnormal results are displayed) Labs Reviewed - No data to display  EKG  EKG Interpretation None       Radiology No results found.  Procedures Procedures (including critical care time)  Medications Ordered in ED Medications - No data to display   Initial Impression / Assessment and Plan / ED Course  I have reviewed the triage vital signs and the nursing notes.  Pertinent labs & imaging results that were available during my care of the patient were reviewed by me and considered in my medical decision making (see chart for details).  Clinical Course    Patients symptoms are consistent with URI, likely viral etiology. Discussed that antibiotics are not indicated for viral infections. Pt will be discharged with symptomatic treatment.  Verbalizes understanding and is agreeable with plan. Pt is hemodynamically stable & in NAD prior to dc.   Final Clinical Impressions(s) / ED Diagnoses   Final diagnoses:  Pharyngitis  URI (upper respiratory infection)    New Prescriptions New Prescriptions   BENZONATATE (TESSALON) 100 MG CAPSULE    Take 2 capsules (200 mg total) by mouth 2 (two) times daily as needed for cough.   I personally performed the services described in this documentation, which was scribed in my presence. The recorded information has been reviewed and is accurate.       Roxy Horseman, PA-C 06/19/16 2216    Zadie Rhine, MD 06/19/16 [phone]

## 2016-06-22 LAB — CULTURE, GROUP A STREP (THRC)

## 2020-02-03 ENCOUNTER — Ambulatory Visit: Payer: Self-pay | Admitting: Podiatry

## 2020-02-28 ENCOUNTER — Ambulatory Visit: Payer: Medicaid Other | Admitting: Internal Medicine

## 2020-02-28 ENCOUNTER — Other Ambulatory Visit: Payer: Self-pay

## 2020-02-28 VITALS — BP 131/80 | HR 91 | Temp 98.0°F | Ht 68.5 in | Wt >= 6400 oz

## 2020-02-28 DIAGNOSIS — Z0000 Encounter for general adult medical examination without abnormal findings: Secondary | ICD-10-CM

## 2020-02-28 DIAGNOSIS — K30 Functional dyspepsia: Secondary | ICD-10-CM

## 2020-02-28 DIAGNOSIS — J309 Allergic rhinitis, unspecified: Secondary | ICD-10-CM

## 2020-02-28 DIAGNOSIS — K529 Noninfective gastroenteritis and colitis, unspecified: Secondary | ICD-10-CM | POA: Diagnosis present

## 2020-02-28 HISTORY — DX: Functional dyspepsia: K30

## 2020-02-28 MED ORDER — LORATADINE 10 MG PO TABS
10.0000 mg | ORAL_TABLET | Freq: Every day | ORAL | 0 refills | Status: DC
Start: 2020-02-28 — End: 2020-09-21

## 2020-02-28 NOTE — Patient Instructions (Signed)
It was a pleasure to see you today Ms. Kathryn Andrews. Please make the following changes:  We have collected bloodwork to investigate why you are having diarrhea  You may use imodium for your diarrhea  If you have any questions or concerns, please call our clinic at [phone] between 9am-5pm and after hours call [phone] and ask for the internal medicine resident on call. If you feel you are having a medical emergency please call 911.   Thank you, we look forward to help you remain healthy!  Lorenso Courier, MD Internal Medicine PGY3   To schedule an appointment for a COVID vaccine or be added to the vaccine wait list: Go to [email or web address]   OR Go to [email or web address]                  OR Call [phone]                                     OR Call [phone] and select Option 2  [address] [facility] [place]) [place] [address] [facility] [place], [place] [address] Hours: Mon,Thu 8-5, Sat 8-12 Type: Pfizer (12+)  [address]  [facility]) [place] [employer] [address] [place], [place] [address] Hours: Thu: 1-5 Type: Pfizer (12+)

## 2020-02-28 NOTE — Progress Notes (Signed)
   CC: Diarrhea  HPI:  [name] is a 24 y.o. asthma, adhd who presents to establish care and discuss regarding her diarrhea. Please see problem based charting for evaluation, assessment, and plan. Patient was accompanied by her grandmother who was assisting with history.  Past Medical History:  Diagnosis Date  . Asthma   . Attention deficit hyperactivity disorder   . Chest pain   . Chronic constipation    Family History  Problem Relation Age of Onset  . Diabetes Paternal Grandmother   . CAD Paternal Grandmother   . Hypertension Paternal Grandmother   . Hyperlipidemia Paternal Grandmother    Social History   Socioeconomic History  . Marital status: Single    Spouse name: Not on file  . Number of children: Not on file  . Years of education: Not on file  . Highest education level: Not on file  Occupational History  . Not on file  Tobacco Use  . Smoking status: Current Every Day Smoker    Types: E-cigarettes    Start date: 02/28/2019  . Smokeless tobacco: Never Used  . Tobacco comment: Vapes   Substance and Sexual Activity  . Alcohol use: Yes    Comment: occasionally  . Drug use: Yes    Types: Marijuana  . Sexual activity: Not on file  Other Topics Concern  . Not on file  Social History Narrative  . Not on file   Social Determinants of Health   Financial Resource Strain:   . Difficulty of Paying Living Expenses:   Food Insecurity:   . Worried About Programme researcher, broadcasting/film/video in the Last Year:   . Barista in the Last Year:   Transportation Needs:   . Freight forwarder (Medical):   Marland Kitchen Lack of Transportation (Non-Medical):   Physical Activity:   . Days of Exercise per Week:   . Minutes of Exercise per Session:   Stress:   . Feeling of Stress :   Social Connections:   . Frequency of Communication with Friends and Family:   . Frequency of Social Gatherings with Friends and Family:   . Attends Religious Services:   . Active Member of Clubs or  Organizations:   . Attends Banker Meetings:   Marland Kitchen Marital Status:    Review of Systems:    Denies nausea, vomiting, chest pain Has dyspnea   Physical Exam:  Vitals:   02/28/20 1440  BP: 131/80  Pulse: 91  Temp: 98 F (36.7 C)  TempSrc: Oral  SpO2: 100%  Weight: (!) 412 lb 9.6 oz (187.2 kg)  Height: 5' 8.5" (1.74 m)   Physical Exam  Constitutional: She appears well-developed and well-nourished. No distress.  HENT:  Head: Normocephalic and atraumatic.  Cardiovascular: Normal rate, regular rhythm and normal heart sounds.  Respiratory: Effort normal and breath sounds normal. No respiratory distress. She has no wheezes.  GI: Soft. Bowel sounds are normal. She exhibits no distension. There is abdominal tenderness (mild amount ).  Musculoskeletal:        General: No edema.     Comments: Plantar aspect of left foot with small puncture wound that is clean without drainage  Skin: She is not diaphoretic.   Assessment & Plan:   See Encounters Tab for problem based charting.  Patient discussed with Dr. Rogelia Boga

## 2020-02-29 LAB — LIPID PANEL
Chol/HDL Ratio: 2.4 ratio (ref 0.0–4.4)
Cholesterol, Total: 149 mg/dL (ref 100–199)
HDL: 61 mg/dL (ref >39)
LDL Chol Calc (NIH): 77 mg/dL (ref 0–99)
Triglycerides: 54 mg/dL (ref 0–149)
VLDL Cholesterol Cal: 11 mg/dL (ref 5–40)

## 2020-02-29 LAB — CBC
Hematocrit: 44.3 % (ref 34.0–46.6)
Hemoglobin: 14.9 g/dL (ref 11.1–15.9)
MCH: 30.3 pg (ref 26.6–33.0)
MCHC: 33.6 g/dL (ref 31.5–35.7)
MCV: 90 fL (ref 79–97)
Platelets: 292 10*3/uL (ref 150–450)
RBC: 4.91 x10E6/uL (ref 3.77–5.28)
RDW: 12.7 % (ref 11.7–15.4)
WBC: 7.6 10*3/uL (ref 3.4–10.8)

## 2020-02-29 LAB — HEMOGLOBIN A1C
Est. average glucose Bld gHb Est-mCnc: 103 mg/dL
Hgb A1c MFr Bld: 5.2 % (ref 4.8–5.6)

## 2020-02-29 LAB — CMP14 + ANION GAP
ALT: 17 IU/L (ref 0–32)
AST: 23 IU/L (ref 0–40)
Albumin/Globulin Ratio: 1.2 (ref 1.2–2.2)
Albumin: 4 g/dL (ref 3.9–5.0)
Alkaline Phosphatase: 133 IU/L — ABNORMAL HIGH (ref 48–121)
Anion Gap: 14 mmol/L (ref 10.0–18.0)
BUN/Creatinine Ratio: 9 (ref 9–23)
BUN: 8 mg/dL (ref 6–20)
Bilirubin Total: 0.5 mg/dL (ref 0.0–1.2)
CO2: 23 mmol/L (ref 20–29)
Calcium: 9.2 mg/dL (ref 8.7–10.2)
Chloride: 104 mmol/L (ref 96–106)
Creatinine, Ser: 0.85 mg/dL (ref 0.57–1.00)
GFR calc Af Amer: 110 mL/min/{1.73_m2} (ref >59)
GFR calc non Af Amer: 96 mL/min/{1.73_m2} (ref >59)
Globulin, Total: 3.3 g/dL (ref 1.5–4.5)
Glucose: 87 mg/dL (ref 65–99)
Potassium: 4.2 mmol/L (ref 3.5–5.2)
Sodium: 141 mmol/L (ref 134–144)
Total Protein: 7.3 g/dL (ref 6.0–8.5)

## 2020-02-29 LAB — TSH: TSH: 2.24 u[IU]/mL (ref 0.450–4.500)

## 2020-02-29 LAB — HIV ANTIBODY (ROUTINE TESTING W REFLEX): HIV Screen 4th Generation wRfx: NONREACTIVE

## 2020-03-02 ENCOUNTER — Telehealth: Payer: Self-pay | Admitting: Internal Medicine

## 2020-03-02 ENCOUNTER — Encounter: Payer: Self-pay | Admitting: Internal Medicine

## 2020-03-02 DIAGNOSIS — J309 Allergic rhinitis, unspecified: Secondary | ICD-10-CM | POA: Insufficient documentation

## 2020-03-02 DIAGNOSIS — Z0000 Encounter for general adult medical examination without abnormal findings: Secondary | ICD-10-CM | POA: Insufficient documentation

## 2020-03-02 HISTORY — DX: Morbid (severe) obesity due to excess calories: E66.01

## 2020-03-02 NOTE — Assessment & Plan Note (Signed)
Will order lipi

## 2020-03-02 NOTE — Telephone Encounter (Signed)
Pt requesting a call back in reference to her Test results.

## 2020-03-02 NOTE — Telephone Encounter (Signed)
Called patient back.

## 2020-03-02 NOTE — Assessment & Plan Note (Addendum)
The patient states that she has been having several months of loose stools. She has to go to the bathroom within few months of eating anything. She denies any pain with defecation, bloody stool. She has not used any laxatives, drinks city water, no recent travel, no fevers/chills, has not had any significant changes in diet. She eats out a lot at [employer]. She denies any nausea vomiting.   Assessment and plan  Patient with chronic diarrhea that maybe secondary to diet or malabsorption. Counseled on avoiding sodas and other substances with additives in them. Will order stool potassium, sodium, and osmolality to calculate stool osmolar gap to classify which type of diarrhea. Patient is to also maintain a food diary. Follow up in 4 weeks. Order tsh, cmp, hiv.

## 2020-03-02 NOTE — Assessment & Plan Note (Signed)
Patient states that she has been having runny eyes and postnasal drip.   -loratadine 10mg  qd  -nasal sinus rinse

## 2020-03-02 NOTE — Assessment & Plan Note (Signed)
Will order cbc, lipid panel, a1c

## 2020-03-06 NOTE — Progress Notes (Signed)
Internal Medicine Clinic Attending  Case discussed with Dr. Chundi at the time of the visit.  We reviewed the resident's history and exam and pertinent patient test results.  I agree with the assessment, diagnosis, and plan of care documented in the resident's note. 

## 2020-03-28 ENCOUNTER — Ambulatory Visit: Payer: Medicaid Other | Admitting: Internal Medicine

## 2020-03-28 DIAGNOSIS — F32A Depression, unspecified: Secondary | ICD-10-CM

## 2020-03-28 DIAGNOSIS — R519 Headache, unspecified: Secondary | ICD-10-CM

## 2020-03-28 DIAGNOSIS — F418 Other specified anxiety disorders: Secondary | ICD-10-CM

## 2020-03-28 DIAGNOSIS — F419 Anxiety disorder, unspecified: Secondary | ICD-10-CM

## 2020-03-28 DIAGNOSIS — K580 Irritable bowel syndrome with diarrhea: Secondary | ICD-10-CM

## 2020-03-28 DIAGNOSIS — K529 Noninfective gastroenteritis and colitis, unspecified: Secondary | ICD-10-CM | POA: Diagnosis not present

## 2020-03-28 DIAGNOSIS — F339 Major depressive disorder, recurrent, unspecified: Secondary | ICD-10-CM

## 2020-03-28 DIAGNOSIS — G8929 Other chronic pain: Secondary | ICD-10-CM

## 2020-03-28 DIAGNOSIS — R350 Frequency of micturition: Secondary | ICD-10-CM | POA: Diagnosis not present

## 2020-03-28 DIAGNOSIS — M545 Low back pain, unspecified: Secondary | ICD-10-CM

## 2020-03-28 MED ORDER — DULOXETINE HCL 30 MG PO CPEP: ORAL_CAPSULE | ORAL | 2 refills | Status: DC

## 2020-03-28 NOTE — Progress Notes (Signed)
   CC: intermittent Abdominal Pain  HPI:  Kathryn Andrews is a 24 y.o. with PMH as below.   Please see A&P for assessment of the patient's acute and chronic medical conditions.   Discussed with nursing and spO2 reportedly 96% and not 86%   Past Medical History:  Diagnosis Date  . Anxiety   . Asthma   . Attention deficit hyperactivity disorder   . Chest pain   . Chronic constipation   . Depression   . Morbid obesity (HCC) 03/02/2020   Review of Systems:   Review of Systems  Constitutional: Negative for chills, fever and weight loss.  Eyes: Negative for blurred vision and double vision.  Respiratory: Negative for cough, shortness of breath and wheezing.   Cardiovascular: Negative for chest pain, palpitations and leg swelling.  Gastrointestinal: Positive for abdominal pain and diarrhea. Negative for blood in stool, constipation, heartburn, melena, nausea and vomiting.  Genitourinary: Positive for frequency. Negative for dysuria, flank pain and urgency.  Musculoskeletal: Positive for back pain.  Neurological: Negative for dizziness, sensory change and weakness.  Psychiatric/Behavioral: Positive for depression. Negative for suicidal ideas. The patient is nervous/anxious.    Physical Exam: Constitution: NAD, morbidly obese Cardio: RRR, no m/r/g, no LE edema  Respiratory: CTA, no w/r/r Abdominal: NTTP, soft, non-distended, normal BS MSK: moving all extremities Neuro: normal affect, slightly anxious, a&ox3 Skin: c/d/i    Vitals:   03/28/20 1624  BP: (!) 144/96  Pulse: 86  Temp: 98.7 F (37.1 C)  TempSrc: Oral  SpO2: (!) 86%  Weight: (!) 417 lb 3.2 oz (189.2 kg)  Height: 5' 8.5" (1.74 m)    Assessment & Plan:   See Encounters Tab for problem based charting.  Patient discussed with Dr. Antony Contras

## 2020-03-28 NOTE — Patient Instructions (Signed)
Thank you for allowing [place] to provide your care today. Today we discussed your urinary frequency, back pain, headaches and loose stools.     I have ordered the following labs for you:  Urinalysis    I will call if any are abnormal.    Today we made the following changes to your medications:   Please START taking  cymbalta 30 mg - take 1 tablet per day for one week then increase to 2 tablets per day.   Take tylenol as needed for headaches.    I have referred you to our dietician, Lupita Leash. The clinic will call to make an appointment.   Please follow-up in one week to discuss your lower back pain.    Please call the internal medicine center clinic if you have any questions or concerns, we may be able to help and keep you from a long and expensive emergency room wait. Our clinic and after hours phone number is [phone], the best time to call is Monday through Friday 9 am to 4 pm but there is always someone available 24/7 if you have an emergency. If you need medication refills please notify your pharmacy one week in advance and they will send [place] a request.

## 2020-03-31 LAB — POTASSIUM, STOOL: Potassium, Stl: 98 mmol/L

## 2020-03-31 LAB — OSMOLALITY, STOOL: Osmolality,Stl: 544 mOsmol/kg

## 2020-03-31 LAB — SODIUM, STOOL: Sodium, Stl: 48 mmol/L

## 2020-04-02 DIAGNOSIS — G8929 Other chronic pain: Secondary | ICD-10-CM | POA: Insufficient documentation

## 2020-04-02 DIAGNOSIS — R350 Frequency of micturition: Secondary | ICD-10-CM | POA: Insufficient documentation

## 2020-04-02 DIAGNOSIS — M545 Low back pain, unspecified: Secondary | ICD-10-CM

## 2020-04-02 DIAGNOSIS — F419 Anxiety disorder, unspecified: Secondary | ICD-10-CM | POA: Insufficient documentation

## 2020-04-02 HISTORY — DX: Frequency of micturition: R35.0

## 2020-04-02 HISTORY — DX: Low back pain, unspecified: M54.50

## 2020-04-02 HISTORY — DX: Other chronic pain: G89.29

## 2020-04-02 NOTE — Assessment & Plan Note (Signed)
She has abdominal pain intermittently. She describes the pain as sharp and cramping. Usually associated with bowel movement and will have relief about thirty minutes after defecation. Her frequency of BM have decreased since she has stopped drinking soda. BM are often soft, light and dark brown, and occur 2x per day. No blood in stool or dark stools. Abdominal pain not affected by eating. Symptoms overall consistent with IBS.   - discussed importance of food diary and keeping track of foods that cause abdominal irritation - start cymbalta 30 mg qd for one week, then increase to bid.  - referral to dietician to help with foods that might exacerbate her symptoms.  - previously planned stool studies, she does not need to use the bathroom today but will f/u in one week.

## 2020-04-02 NOTE — Assessment & Plan Note (Signed)
She endorses having to use the restroom frequently. At night she wakes up every couple of hours to use the restroom. She denies dysuria or hematuria. She is on her menstrual cycle which was absent for years and just recently restarted. She denies feelings of incomplete emptying, incontinence or vaginal discharge besides menstruation.  Recent a1c 02/28/20 is 5.2. Possibly overactive bladder although she has no symptoms of incontinence.   - Urine dipstick showed hemoglobin only, likely secondary to menstrual cycle. We did not have time to discuss sexual activity, f/u in one week - discuss last pap smear at f/u, none seen in chart

## 2020-04-02 NOTE — Assessment & Plan Note (Addendum)
This problem is chronic for the past two years. She has been told multiple times it is due to her obesity, but she feels that something more is going on. She sometimes feels she cannot move because of it and the symptoms include upper and LE when this occurs, and she just has to lie in bed. Denies numbness or shooting pains, no saddle anesthesia or incontinence. Did not have time to address this fully and discussed that we will need to discuss further at follow-up.  Previous xr last done in 2017 showed no acute findings. On PE strength is 5/5, symmetrical and sensation intact.    - f/u in one week for further discussion  - consider PT vs. Referral to sports medicine with symptoms ongoing for so long.  - discussed that it is possible this is secondary to obesity, which she has been told before and frustrates her. Referral to dietician placed

## 2020-04-02 NOTE — Assessment & Plan Note (Signed)
She has a history of both depression and major anxiety for many years. Her anxiety makes some interaction very difficult for her. She was hesitant to move from the chair to the table for physical exam so we stayed with the chair. She has never tried medication before but is interested. She denies SI or wanting to hurt herself.   - start cymbalta 30 mg qd for one week, then increase to bid - discussed possibility of worsening depression during initial therapy and this will take 4-6 weeks to take effect for depression

## 2020-04-05 ENCOUNTER — Encounter: Payer: Self-pay | Admitting: Dietician

## 2020-04-18 NOTE — Progress Notes (Signed)
Internal Medicine Clinic Attending  Case discussed with Dr. Cleaster Corin at the time of the visit.  We reviewed the resident's history and exam and pertinent patient test results.  I agree with the assessment, diagnosis, and plan of care documented in the resident's note.   Correction: Patient's oxygen saturation was 96% not 86%.

## 2020-07-13 ENCOUNTER — Ambulatory Visit: Payer: Medicaid Other | Admitting: Podiatry

## 2020-07-17 ENCOUNTER — Ambulatory Visit: Payer: Medicaid Other | Admitting: Podiatry

## 2020-08-01 ENCOUNTER — Other Ambulatory Visit: Payer: Self-pay

## 2020-08-01 ENCOUNTER — Ambulatory Visit (INDEPENDENT_AMBULATORY_CARE_PROVIDER_SITE_OTHER): Payer: Medicaid Other | Admitting: Podiatry

## 2020-08-01 ENCOUNTER — Ambulatory Visit (INDEPENDENT_AMBULATORY_CARE_PROVIDER_SITE_OTHER): Payer: Medicaid Other

## 2020-08-01 DIAGNOSIS — S99921A Unspecified injury of right foot, initial encounter: Secondary | ICD-10-CM

## 2020-08-02 ENCOUNTER — Encounter: Payer: Self-pay | Admitting: Podiatry

## 2020-08-02 NOTE — Progress Notes (Signed)
Subjective:  Patient ID: Kathryn Andrews, female    DOB: [birth year],  MRN: [id number]  Chief Complaint  Patient presents with  . Foot Injury    pt states she injured her foot two months ago and it is still painful     24 y.o. female presents with the above complaint.  Patient presents with complaint of right dorsal foot pain that has been going on for quite some time.  Patient had a right foot injury when she stepped outside in the backyard stepped down and hard to something pop.  She states this happened 2 months ago has not gotten better.  Her pain has gone down a little bit but not so much there is some burning sensation associated with it.  She has tried using ice which has not helped.  She denies any other treatment options.  She has not seen anyone else prior to seeing me.   Review of Systems: Negative except as noted in the HPI. Denies N/V/F/Ch.  Past Medical History:  Diagnosis Date  . Anxiety   . Asthma   . Attention deficit hyperactivity disorder   . Chest pain   . Chronic constipation   . Depression   . Morbid obesity (HCC) 03/02/2020    Current Outpatient Medications:  .  albuterol (PROVENTIL HFA;VENTOLIN HFA) 108 (90 BASE) MCG/ACT inhaler, Inhale 2 puffs into the lungs every 6 (six) hours as needed for wheezing or shortness of breath., Disp: , Rfl:  .  DULoxetine (CYMBALTA) 30 MG capsule, Take 1 capsule per day for three weeks, then take 2 capsules per day., Disp: 60 capsule, Rfl: 2 .  loratadine (CLARITIN) 10 MG tablet, Take 1 tablet (10 mg total) by mouth daily., Disp: 90 tablet, Rfl: 0  Social History   Tobacco Use  Smoking Status Current Every Day Smoker  . Types: E-cigarettes  . Start date: 02/28/2019  Smokeless Tobacco Never Used  Tobacco Comment   Vapes     No Known Allergies Objective:  There were no vitals filed for this visit. There is no height or weight on file to calculate BMI. Constitutional Well developed. Well nourished.  Vascular Dorsalis  pedis pulses palpable bilaterally. Posterior tibial pulses palpable bilaterally. Capillary refill normal to all digits.  No cyanosis or clubbing noted. Pedal hair growth normal.  Neurologic Normal speech. Oriented to person, place, and time. Epicritic sensation to light touch grossly present bilaterally.  Dermatologic Nails well groomed and normal in appearance. No open wounds. No skin lesions.  Orthopedic:  Pain on palpation to the dorsal lateral foot on the right side.  Pain with resisted dorsiflexion plantarflexion of the digits.  No pain with range of motion of the metatarsophalangeal joint.     Radiographs: 3 views of skeletally mature adult right foot: No osseous abnormalities noted.  No fractures noted.  No osteoarthritic changes noted.  No Lisfranc injury. Assessment:   1. Foot injury, right, initial encounter    Plan:  Patient was evaluated and treated and all questions answered.  Right foot extensor tendinitis -I spent patient the etiology of extensor tendinitis and various treatment options were extensively discussed.  Given the amount of pain she is having I believe patient will benefit from cam boot immobilization.  We will allow the soft tissue to heal properly for next 4 weeks if there is no improvement we will discuss either MRI versus steroid injection.  Patient agrees with the plan.  She would like to proceed with cam boot immobilization.  No follow-ups  on file.

## 2020-08-08 ENCOUNTER — Encounter: Payer: Medicaid Other | Admitting: Internal Medicine

## 2020-08-08 NOTE — Progress Notes (Deleted)
   CC: ***  HPI:  [name] is a 24 y.o.   Past Medical History:  Diagnosis Date  . Anxiety   . Asthma   . Attention deficit hyperactivity disorder   . Chest pain   . Chronic constipation   . Depression   . Morbid obesity (HCC) 03/02/2020   Review of Systems:  ***  Physical Exam:  There were no vitals filed for this visit. ***  Assessment & Plan:   See Encounters Tab for problem based charting.  Patient {GC/GE:3044014::"discussed with","seen with"} Dr. {NAMES:3044014::"Butcher","Guilloud","Hoffman","Mullen","Narendra","Raines","Vincent"}

## 2020-08-29 ENCOUNTER — Ambulatory Visit: Payer: Medicaid Other | Admitting: Podiatry

## 2020-09-18 ENCOUNTER — Other Ambulatory Visit: Payer: Self-pay | Admitting: Internal Medicine

## 2020-09-18 DIAGNOSIS — F419 Anxiety disorder, unspecified: Secondary | ICD-10-CM

## 2020-09-18 DIAGNOSIS — K580 Irritable bowel syndrome with diarrhea: Secondary | ICD-10-CM

## 2020-09-18 DIAGNOSIS — F339 Major depressive disorder, recurrent, unspecified: Secondary | ICD-10-CM

## 2020-09-19 ENCOUNTER — Ambulatory Visit: Payer: Medicaid Other | Admitting: Podiatry

## 2020-09-19 NOTE — Telephone Encounter (Signed)
albuterol (PROVENTIL HFA;VENTOLIN HFA) 108 (90 BASE) MCG/ACT inhaler  DULoxetine (CYMBALTA) 30 MG capsule  loratadine (CLARITIN) 10 MG tablet(Expired),  Refill request @  [id number] [place],  - [address] Phone:  [phone]  Fax:  [phone]     Pt's grandmother states the pharmacy is waiting for a reply back from the clinic. Requesting the med to be filled by today.

## 2020-09-26 ENCOUNTER — Other Ambulatory Visit: Payer: Self-pay

## 2020-09-26 ENCOUNTER — Ambulatory Visit (INDEPENDENT_AMBULATORY_CARE_PROVIDER_SITE_OTHER): Payer: Medicaid Other | Admitting: Podiatry

## 2020-09-26 DIAGNOSIS — S99921A Unspecified injury of right foot, initial encounter: Secondary | ICD-10-CM

## 2020-09-26 DIAGNOSIS — M778 Other enthesopathies, not elsewhere classified: Secondary | ICD-10-CM

## 2020-09-26 DIAGNOSIS — M7751 Other enthesopathy of right foot: Secondary | ICD-10-CM

## 2020-10-01 ENCOUNTER — Encounter: Payer: Self-pay | Admitting: Podiatry

## 2020-10-01 NOTE — Progress Notes (Signed)
Subjective:  Patient ID: Kathryn Andrews, female    DOB: [birth year],  MRN: [id number]  Chief Complaint  Patient presents with  . Follow-up    Pt states her pain has worsened since her last visit.     24 y.o. female presents with the above complaint.  Patient presents with the foot pain of right dorsal foot.  Patient states that the foot is about the same it hurts so the same.  The cam boot has helped a little bit but it has not helped that much.  She would like to discuss other treatment options.  She denies any other acute complaints.   Review of Systems: Negative except as noted in the HPI. Denies N/V/F/Ch.  Past Medical History:  Diagnosis Date  . Anxiety   . Asthma   . Attention deficit hyperactivity disorder   . Chest pain   . Chronic constipation   . Depression   . Morbid obesity (HCC) 03/02/2020    Current Outpatient Medications:  .  albuterol (PROVENTIL HFA;VENTOLIN HFA) 108 (90 BASE) MCG/ACT inhaler, Inhale 2 puffs into the lungs every 6 (six) hours as needed for wheezing or shortness of breath., Disp: , Rfl:  .  DULoxetine (CYMBALTA) 30 MG capsule, TAKE 1 CAPSULE PER DAY FOR THREE WEEKS, THEN TAKE 2 CAPSULES PER DAY., Disp: 60 capsule, Rfl: 2 .  loratadine (CLARITIN) 10 MG tablet, TAKE 1 TABLET BY MOUTH EVERY DAY, Disp: 90 tablet, Rfl: 0  Social History   Tobacco Use  Smoking Status Current Every Day Smoker  . Types: E-cigarettes  . Start date: 02/28/2019  Smokeless Tobacco Never Used  Tobacco Comment   Vapes     No Known Allergies Objective:  There were no vitals filed for this visit. There is no height or weight on file to calculate BMI. Constitutional Well developed. Well nourished.  Vascular Dorsalis pedis pulses palpable bilaterally. Posterior tibial pulses palpable bilaterally. Capillary refill normal to all digits.  No cyanosis or clubbing noted. Pedal hair growth normal.  Neurologic Normal speech. Oriented to person, place, and time. Epicritic  sensation to light touch grossly present bilaterally.  Dermatologic Nails well groomed and normal in appearance. No open wounds. No skin lesions.  Orthopedic:  Pain on palpation to the dorsal lateral foot on the right side.  Pain with resisted dorsiflexion plantarflexion of the digits.  No pain with range of motion of the metatarsophalangeal joint.     Radiographs: 3 views of skeletally mature adult right foot: No osseous abnormalities noted.  No fractures noted.  No osteoarthritic changes noted.  No Lisfranc injury. Assessment:   1. Foot injury, right, initial encounter   2. Extensor tendinitis of foot    Plan:  Patient was evaluated and treated and all questions answered.  Right foot extensor tendinitis/foot injury -I spent patient the etiology of extensor tendinitis and various treatment options were extensively discussed.  Given the amount of pain she is having I believe patient will benefit from cam boot immobilization.   -Cam boot has helped a little bit however given that her pain is about the same I believe patient will benefit from a steroid injection to help decrease acute inflammatory component associated pain.  I discussed that this is nearby the tendon there is a risk of rupture associated with it.  Patient would like to proceed despite the risks. -A steroid injection was performed at right dorsal foot a point of maximal tenderness using 1% plain Lidocaine and 10 mg of Kenalog. This was well  tolerated. -I also believe she will benefit from an MRI to rule out tendinitis versus rupture.  Patient agrees with plan like to obtain MRI   No follow-ups on file.

## 2020-10-10 ENCOUNTER — Telehealth: Payer: Self-pay

## 2020-10-10 NOTE — Telephone Encounter (Signed)
Pt's grandmother requesting to speak with a nurse. States pt got tested positive for COVID, requesting inhaler. Please call back.

## 2020-10-11 ENCOUNTER — Emergency Department (HOSPITAL_COMMUNITY)
Admission: EM | Admit: 2020-10-11 | Discharge: 2020-10-11 | Disposition: A | Discharge status: Home / Self Care | Payer: Medicaid Other | Attending: Emergency Medicine | Admitting: Emergency Medicine

## 2020-10-11 ENCOUNTER — Emergency Department (HOSPITAL_COMMUNITY): Payer: Medicaid Other

## 2020-10-11 ENCOUNTER — Encounter (HOSPITAL_COMMUNITY): Payer: Self-pay | Admitting: Emergency Medicine

## 2020-10-11 DIAGNOSIS — J45909 Unspecified asthma, uncomplicated: Secondary | ICD-10-CM | POA: Insufficient documentation

## 2020-10-11 DIAGNOSIS — F17210 Nicotine dependence, cigarettes, uncomplicated: Secondary | ICD-10-CM | POA: Insufficient documentation

## 2020-10-11 DIAGNOSIS — U071 COVID-19: Secondary | ICD-10-CM | POA: Diagnosis not present

## 2020-10-11 DIAGNOSIS — D696 Thrombocytopenia, unspecified: Secondary | ICD-10-CM

## 2020-10-11 DIAGNOSIS — R059 Cough, unspecified: Secondary | ICD-10-CM | POA: Diagnosis present

## 2020-10-11 DIAGNOSIS — E876 Hypokalemia: Secondary | ICD-10-CM

## 2020-10-11 LAB — BASIC METABOLIC PANEL
Anion gap: 11 (ref 5–15)
BUN: 5 mg/dL — ABNORMAL LOW (ref 6–20)
CO2: 27 mmol/L (ref 22–32)
Calcium: 8.4 mg/dL — ABNORMAL LOW (ref 8.9–10.3)
Chloride: 98 mmol/L (ref 98–111)
Creatinine, Ser: 1 mg/dL (ref 0.44–1.00)
GFR, Estimated: >60 mL/min (ref >60)
Glucose, Bld: 95 mg/dL (ref 70–99)
Potassium: 3.1 mmol/L — ABNORMAL LOW (ref 3.5–5.1)
Sodium: 136 mmol/L (ref 135–145)

## 2020-10-11 LAB — CBC
HCT: 41 % (ref 36.0–46.0)
Hemoglobin: 13 g/dL (ref 12.0–15.0)
MCH: 29.4 pg (ref 26.0–34.0)
MCHC: 31.7 g/dL (ref 30.0–36.0)
MCV: 92.8 fL (ref 80.0–100.0)
Platelets: 124 10*3/uL — ABNORMAL LOW (ref 150–400)
RBC: 4.42 MIL/uL (ref 3.87–5.11)
RDW: 12.8 % (ref 11.5–15.5)
WBC: 4.8 10*3/uL (ref 4.0–10.5)
nRBC: 0 % (ref 0.0–0.2)

## 2020-10-11 LAB — POC SARS CORONAVIRUS 2 AG -  ED: SARS Coronavirus 2 Ag: POSITIVE — AB

## 2020-10-11 LAB — I-STAT BETA HCG BLOOD, ED (MC, WL, AP ONLY): I-stat hCG, quantitative: <5 m[IU]/mL (ref <5)

## 2020-10-11 MED ORDER — ACETAMINOPHEN 500 MG PO TABS
1000.0000 mg | ORAL_TABLET | Freq: Once | ORAL | Status: AC
  Administered 2020-10-11: 1000 mg via ORAL
  Filled 2020-10-11: qty 2

## 2020-10-11 MED ORDER — ONDANSETRON 4 MG PO TBDP
8.0000 mg | ORAL_TABLET | Freq: Once | ORAL | Status: AC
  Administered 2020-10-11: 8 mg via ORAL
  Filled 2020-10-11: qty 2

## 2020-10-11 MED ORDER — POTASSIUM CHLORIDE CRYS ER 20 MEQ PO TBCR
40.0000 meq | EXTENDED_RELEASE_TABLET | Freq: Once | ORAL | Status: AC
  Administered 2020-10-11: 40 meq via ORAL
  Filled 2020-10-11: qty 2

## 2020-10-11 NOTE — Telephone Encounter (Signed)
I agree. Thank you.

## 2020-10-11 NOTE — ED Notes (Signed)
Pt is tolerating water well. Pt has drank 2 glasses.

## 2020-10-11 NOTE — Telephone Encounter (Signed)
Let me know if patient needs a Telehealth appointment.

## 2020-10-11 NOTE — Telephone Encounter (Signed)
As our clinic is full, [facility] would be best place for her to be evaluated.

## 2020-10-11 NOTE — Telephone Encounter (Signed)
Spoke with patient's grandmother. States she had EMS come to house yesterday but they would not transport patient to ED. They stated, "it would be a waste of time" and "she probably has Covid." Patient has cough, N/V, fever, leg weakness, dry mouth. Patient is having a hard time keeping anything down. Patient needs tele appt today. Kinnie Feil, BSN, RN-BC

## 2020-10-11 NOTE — Telephone Encounter (Signed)
Currently no openings today for telehealth visit.  Forwarding back to triage to see if patient can wait until tomorrow to be seen.

## 2020-10-11 NOTE — Discharge Instructions (Addendum)
It was our pleasure to provide your ER care today - we hope that you feel better.  Your covid test is positive - see covid information.  Stay well hydrated and get adequate nutrition. Stay active, take full and deep breaths.   Take acetaminophen or ibuprofen as need.   From today's labs, your potassium level is low (3.1) - eat plenty of fruits and vegetables, and follow up with primary care doctor in 2-3 weeks. Also from the lab work, your platelet count is low (124), also follow up with your doctor in 2-3 weeks.   Return to ER if worse, new symptoms, increased  trouble breathing, persistent vomiting, weak/fainting, or other concern.

## 2020-10-11 NOTE — ED Provider Notes (Signed)
[facility] EMERGENCY DEPARTMENT Provider Note   CSN: [id number] Arrival date & time: 10/11/20  1509     History Chief Complaint  Patient presents with  . Nausea  . Fever    Kathryn Andrews is a 25 y.o. female.  Patient c/o body aches, non prod cough, fever, congestion for the past 4-5 days. Symptoms acute onset, moderate, constant, persistent. Recently close contact of someone diagnosed w covid. Patient has not been vaccinated. No chest pain or discomfort. No sob. No abd pain. Nausea. No vomiting or diarrhea. No dysuria or gu c/o. No rash. No severe headaches. No neck pain or stiffness. No sore throat or trouble swallowing.   The history is provided by the patient.  Emesis Associated symptoms: cough, fever and myalgias   Associated symptoms: no abdominal pain, no headaches and no sore throat        Past Medical History:  Diagnosis Date  . Anxiety   . Asthma   . Attention deficit hyperactivity disorder   . Chest pain   . Chronic constipation   . Depression   . Morbid obesity ([place]) 03/02/2020    Patient Active Problem List   Diagnosis Date Noted  . Anxiety and depression 04/02/2020  . Chronic lower back pain 04/02/2020  . Urinary frequency 04/02/2020  . Healthcare maintenance 03/02/2020  . Morbid obesity ([place]) 03/02/2020  . Allergic rhinitis 03/02/2020  . IBS (irritable bowel syndrome) 02/28/2020  . Chronic constipation   . Chest pain     Past Surgical History:  Procedure Laterality Date  . adenoid removal    . EXTERNAL EAR SURGERY    . WISDOM TOOTH EXTRACTION       OB History   No obstetric history on file.     Family History  Problem Relation Age of Onset  . Diabetes Paternal Grandmother   . CAD Paternal Grandmother   . Hypertension Paternal Grandmother   . Hyperlipidemia Paternal Grandmother     Social History   Tobacco Use  . Smoking status: Current Every Day Smoker    Types: E-cigarettes    Start date: 02/28/2019  .  Smokeless tobacco: Never Used  . Tobacco comment: Vapes   Substance Use Topics  . Alcohol use: Yes    Comment: occasionally  . Drug use: Yes    Types: Marijuana    Home Medications Prior to Admission medications   Medication Sig Start Date End Date Taking? Authorizing Provider  albuterol (PROVENTIL HFA;VENTOLIN HFA) 108 (90 BASE) MCG/ACT inhaler Inhale 2 puffs into the lungs every 6 (six) hours as needed for wheezing or shortness of breath.    [provider]  DULoxetine (CYMBALTA) 30 MG capsule TAKE 1 CAPSULE PER DAY FOR THREE WEEKS, THEN TAKE 2 CAPSULES PER DAY. 09/21/20   Maudie Mercury, MD  loratadine (CLARITIN) 10 MG tablet TAKE 1 TABLET BY MOUTH EVERY DAY 09/21/20   Maudie Mercury, MD    Allergies    Patient has no known allergies.  Review of Systems   Review of Systems  Constitutional: Positive for fever.  HENT: Negative for sore throat.   Eyes: Negative for redness.  Respiratory: Positive for cough. Negative for shortness of breath.   Cardiovascular: Negative for chest pain.  Gastrointestinal: Positive for nausea. Negative for abdominal pain and vomiting.  Genitourinary: Negative for dysuria and flank pain.  Musculoskeletal: Positive for myalgias. Negative for back pain, neck pain and neck stiffness.  Skin: Negative for rash.  Neurological: Negative for headaches.  Hematological:  Does not bruise/bleed easily.  Psychiatric/Behavioral: Negative for confusion.    Physical Exam Updated Vital Signs BP 128/67   Pulse 74   Temp 100 F (37.8 C)   Resp (!) 181   LMP 09/29/2020   SpO2 97%   Physical Exam Vitals and nursing note reviewed.  Constitutional:      Appearance: Normal appearance. She is well-developed.  HENT:     Head: Atraumatic.     Nose: Nose normal.     Mouth/Throat:     Mouth: Mucous membranes are moist.     Pharynx: Oropharynx is clear. No oropharyngeal exudate or posterior oropharyngeal erythema.  Eyes:     General: No scleral  icterus.    Conjunctiva/sclera: Conjunctivae normal.     Pupils: Pupils are equal, round, and reactive to light.  Neck:     Trachea: No tracheal deviation.     Comments: No stiffness or rigidity.  Cardiovascular:     Rate and Rhythm: Normal rate and regular rhythm.     Pulses: Normal pulses.     Heart sounds: Normal heart sounds. No murmur heard. No friction rub. No gallop.   Pulmonary:     Effort: Pulmonary effort is normal. No respiratory distress.     Breath sounds: Normal breath sounds.  Abdominal:     General: Bowel sounds are normal. There is no distension.     Palpations: Abdomen is soft.     Tenderness: There is no abdominal tenderness. There is no guarding.  Genitourinary:    Comments: No cva tenderness.  Musculoskeletal:        General: No swelling or tenderness.     Cervical back: Normal range of motion and neck supple. No rigidity. No muscular tenderness.  Lymphadenopathy:     Cervical: No cervical adenopathy.  Skin:    General: Skin is warm and dry.     Findings: No rash.  Neurological:     Mental Status: She is alert.     Comments: Alert, speech normal.   Psychiatric:        Mood and Affect: Mood normal.     ED Results / Procedures / Treatments   Labs (all labs ordered are listed, but only abnormal results are displayed) Results for orders placed or performed during the hospital encounter of 10/11/20  CBC  Result Value Ref Range   WBC 4.8 4.0 - 10.5 K/uL   RBC 4.42 3.87 - 5.11 MIL/uL   Hemoglobin 13.0 12.0 - 15.0 g/dL   HCT 41.0 36.0 - 46.0 %   MCV 92.8 80.0 - 100.0 fL   MCH 29.4 26.0 - 34.0 pg   MCHC 31.7 30.0 - 36.0 g/dL   RDW 12.8 11.5 - 15.5 %   Platelets 124 (L) 150 - 400 K/uL   nRBC 0.0 0.0 - 0.2 %  Basic metabolic panel  Result Value Ref Range   Sodium 136 135 - 145 mmol/L   Potassium 3.1 (L) 3.5 - 5.1 mmol/L   Chloride 98 98 - 111 mmol/L   CO2 27 22 - 32 mmol/L   Glucose, Bld 95 70 - 99 mg/dL   BUN 5 (L) 6 - 20 mg/dL   Creatinine, Ser  1.00 0.44 - 1.00 mg/dL   Calcium 8.4 (L) 8.9 - 10.3 mg/dL   GFR, Estimated >60 >60 mL/min   Anion gap 11 5 - 15  I-Stat Beta hCG blood, ED ([facility], [facility], [facility] only)  Result Value Ref Range   I-stat hCG, quantitative <5.0 <5  mIU/mL   Comment 3          POC SARS Coronavirus 2 Ag-ED - Nasal Swab (BD Veritor Kit)  Result Value Ref Range   SARS Coronavirus 2 Ag POSITIVE (A) NEGATIVE   DG Chest Portable 1 View  Result Date: 10/11/2020 CLINICAL DATA:  COVID symptoms with vomiting. EXAM: PORTABLE CHEST 1 VIEW COMPARISON:  03/10/2016 chest radiograph FINDINGS: This is a low volume study. The cardiomediastinal silhouette is unremarkable. Mild bibasilar opacities are present. No pleural effusion or pneumothorax. No acute bony abnormalities are present. IMPRESSION: Low volume study with mild bibasilar opacities which may represent atelectasis or infection. Electronically Signed   By: Margarette Canada M.D.   On: 10/11/2020 15:47    EKG None  Radiology DG Chest Portable 1 View  Result Date: 10/11/2020 CLINICAL DATA:  COVID symptoms with vomiting. EXAM: PORTABLE CHEST 1 VIEW COMPARISON:  03/10/2016 chest radiograph FINDINGS: This is a low volume study. The cardiomediastinal silhouette is unremarkable. Mild bibasilar opacities are present. No pleural effusion or pneumothorax. No acute bony abnormalities are present. IMPRESSION: Low volume study with mild bibasilar opacities which may represent atelectasis or infection. Electronically Signed   By: Margarette Canada M.D.   On: 10/11/2020 15:47    Procedures Procedures (including critical care time)  Medications Ordered in ED Medications  acetaminophen (TYLENOL) tablet 1,000 mg (has no administration in time range)  ondansetron (ZOFRAN-ODT) disintegrating tablet 8 mg (has no administration in time range)    ED Course  I have reviewed the triage vital signs and the nursing notes.  Pertinent labs & imaging results that were available during my care of the patient were  reviewed by me and considered in my medical decision making (see chart for details).    MDM Rules/Calculators/A&P                         Labs sent. Cxr.   Reviewed nursing notes and prior charts for additional history.   No meds pta. Acetaminophen po. Po fluids. zofran po.  Labs reviewed/interpreted by me - covid is positive. k mildly low, kcl po.   CXR reviewed/interpreted by me  - c/w covid.   Kathryn Andrews was evaluated in Emergency Department on 10/11/2020 for the symptoms described in the history of present illness. She was evaluated in the context of the global COVID-19 pandemic, which necessitated consideration that the patient might be at risk for infection with the SARS-CoV-2 virus that causes COVID-19. Institutional protocols and algorithms that pertain to the evaluation of patients at risk for COVID-19 are in a state of rapid change based on information released by regulatory bodies including the CDC and federal and state organizations. These policies and algorithms were followed during the patient's care in the ED.  Pt is breathing comfortably, no increased wob, pulse ox 97% room air.   Patient currently appears stable for d/c.   Left secure chat message with infusion center to f/u with patient if patient deemed candidate for any outpatient therapies.   Return precautions provided.      Final Clinical Impression(s) / ED Diagnoses Final diagnoses:  None    Rx / DC Orders ED Discharge Orders    None       Lajean Saver, MD 10/11/20 1810

## 2020-10-11 NOTE — ED Triage Notes (Signed)
Pt reports COVID like symptoms since Jan 1st. Pt reports, low grade fever, nausea, vomiting. Nonproductive cough. Reports close contact with COVID positive person. Pt unvaccinated. NAD at present. VSS.

## 2020-10-11 NOTE — Telephone Encounter (Signed)
Per Dr. Cleaster Corin, patient should go directly to [facility] or ED. Notified patient's grandmother. States she will call EMS to transport patient to ED per doctor's instructions. Kinnie Feil, BSN, RN-BC

## 2020-10-14 ENCOUNTER — Other Ambulatory Visit: Payer: Self-pay | Admitting: Internal Medicine

## 2020-10-19 ENCOUNTER — Other Ambulatory Visit: Payer: Medicaid Other

## 2020-10-24 ENCOUNTER — Ambulatory Visit: Payer: Medicaid Other | Admitting: Podiatry

## 2020-10-25 ENCOUNTER — Encounter: Payer: Medicaid Other | Admitting: Internal Medicine

## 2020-10-30 ENCOUNTER — Ambulatory Visit: Payer: Medicaid Other | Admitting: Internal Medicine

## 2020-10-30 ENCOUNTER — Other Ambulatory Visit: Payer: Self-pay

## 2020-10-30 DIAGNOSIS — J309 Allergic rhinitis, unspecified: Secondary | ICD-10-CM

## 2020-10-31 ENCOUNTER — Encounter: Payer: Self-pay | Admitting: Internal Medicine

## 2020-10-31 MED ORDER — ALBUTEROL SULFATE HFA 108 (90 BASE) MCG/ACT IN AERS: 2.0000 | INHALATION_SPRAY | Freq: Four times a day (QID) | RESPIRATORY_TRACT | 2 refills | Status: DC | PRN

## 2020-10-31 NOTE — Progress Notes (Signed)
Call patient twice for her telehealth appointment without answer.  I left a HIPAA compliant voicemail to call us back to reschedule her appointment.

## 2020-11-05 ENCOUNTER — Telehealth: Payer: Self-pay | Admitting: Internal Medicine

## 2020-11-05 NOTE — Telephone Encounter (Signed)
Pt's caregiver [name] requesting a call back about her sleeping medication.  Please call back.

## 2020-11-05 NOTE — Telephone Encounter (Signed)
TC to [facility]).  She wants to discuss sleeping medication, states patient not sleeping well, having hallucinations at night. Pt was scheduled for an appt on 1/20 and cancelled.   Scheduled for Telehealth on 10/30/20, MD could not get in touch with patient.  Informed EC, patient needs appt to discuss sleeping medication.  Telehealth appt made for 11/09/20 @0945 .  EC states she screens calls for spam  and to call and leave a message that you are MD calling for an appt.  Asked that you call right back, states she can also call back. Will forward to red team. , RN,BSN

## 2020-11-09 ENCOUNTER — Ambulatory Visit (INDEPENDENT_AMBULATORY_CARE_PROVIDER_SITE_OTHER): Payer: Medicaid Other | Admitting: Student

## 2020-11-09 ENCOUNTER — Other Ambulatory Visit: Payer: Self-pay

## 2020-11-09 DIAGNOSIS — F32A Depression, unspecified: Secondary | ICD-10-CM

## 2020-11-09 DIAGNOSIS — F419 Anxiety disorder, unspecified: Secondary | ICD-10-CM

## 2020-11-09 MED ORDER — SERTRALINE HCL 50 MG PO TABS: 50.0000 mg | ORAL_TABLET | Freq: Every day | ORAL | 2 refills | Status: DC

## 2020-11-09 MED ORDER — HYDROXYZINE HCL 10 MG PO TABS
ORAL_TABLET | ORAL | 0 refills | Status: DC
Start: 2020-11-09 — End: 2021-11-22

## 2020-11-09 NOTE — Addendum Note (Signed)
Addended by: Charissa Bash A on: 11/09/2020 02:13 PM   Modules accepted: Orders

## 2020-11-09 NOTE — Progress Notes (Signed)
  [facility] Internal Medicine Residency Telephone Encounter Continuity Care Appointment  HPI:   This telephone encounter was created for Ms. Kathryn Andrews on 11/09/2020 for the following purpose/cc anxiety. Verified patient's identity via DOB and home address.  Patient reports feeling very anxious and depressed. States that she feels like everything hits her all at once and she has to lock herself up in her room and listen to music in order to even calm down a little. She says that subsequently she feels more down and depressed as well. Reports having little energy, overeating, feeling bad about herself, endorses anhedonia (does not like going outside, which she normally enjoyed doing), trouble concentrating. She denies any suicidal or homicidal ideation at this time. She does report that she has tried cymbalta in the past but this did not help her.  I discussed starting zoloft for her depression. Explained to her that she will need to take this medication daily for 4-6 weeks before it starts having its effects, to which she confirms understanding. I also discussed using hydroxyzine prn for the next 4-6 months when she feels anxious, primarily as a temporizing measure until her zoloft can take effect. Lastly, we discussed scheduling her an appointment with Dr. Monna Fam ([facility]) to help find ways to cope with her depression and anxiety, which I believe it largely driven by her severe obesity limiting her physical capabilities. She confirms understanding and is agreeable to seeing Dr. Monna Fam.  Plan: zoloft 50mg  daily, hydroxyzine prn, see Dr. for further evaluation  I will schedule a follow up visit in 1 month to ensure that zoloft is providing benefit or if it needs to be uptitrated.   Past Medical History:  Past Medical History:  Diagnosis Date  . Anxiety   . Asthma   . Attention deficit hyperactivity disorder   . Chest pain   . Chronic constipation   .  Depression   . Morbid obesity ([facility]) 03/02/2020      ROS:   Negative aside from that listed in HPI.   Assessment / Plan / Recommendations:   Please see A&P under problem oriented charting for assessment of the patient's acute and chronic medical conditions.   As always, pt is advised that if symptoms worsen or new symptoms arise, they should go to an urgent care facility or to to ER for further evaluation.   Consent and Medical Decision Making:   Patient discussed with Dr. 03/04/2020  This is a telephone encounter between Oberia Beaudoin and Kathryn Andrews on 11/09/2020 for anxiety and depression. The visit was conducted with the patient located at home and 01/07/2021 at [facility]. The patient's identity was confirmed using their DOB and current address. The patient has consented to being evaluated through a telephone encounter and understands the associated risks (an examination cannot be done and the patient may need to come in for an appointment) / benefits (allows the patient to remain at home, decreasing exposure to coronavirus). I personally spent 30 minutes on medical discussion.

## 2020-11-09 NOTE — Progress Notes (Signed)
Internal Medicine Clinic Attending  Case discussed with Dr. Austin Miles  At the time of the visit.  We reviewed the resident's history and exam and pertinent patient test results.  I agree with the assessment, diagnosis, and plan of care documented in the resident's note. Greatest driver of anxiety and depression is likely to be her severe obesity and counseling is highly recommended.

## 2020-11-14 ENCOUNTER — Encounter: Payer: Self-pay | Admitting: Internal Medicine

## 2020-11-27 ENCOUNTER — Institutional Professional Consult (permissible substitution): Payer: Medicaid Other | Admitting: Behavioral Health

## 2020-11-30 ENCOUNTER — Ambulatory Visit: Payer: Medicaid Other | Admitting: Behavioral Health

## 2020-11-30 ENCOUNTER — Other Ambulatory Visit: Payer: Self-pay

## 2020-11-30 DIAGNOSIS — F32A Depression, unspecified: Secondary | ICD-10-CM

## 2020-11-30 DIAGNOSIS — F419 Anxiety disorder, unspecified: Secondary | ICD-10-CM

## 2020-11-30 NOTE — BH Specialist Note (Signed)
Integrated Behavioral Health via Telemedicine Visit  11/30/2020 Kathryn Andrews [id number]  Number of Integrated Behavioral Health visits: 1/6 Session Start time: 12:50pm  Session End time: 1:00pm Total time: 40   Referring Provider: Dr. Merrilyn Puma, MD Patient/Family location: Pt in private at home [facility] Provider location: [facility] Office All persons participating in visit: Pt & Clinician Types of Service: Individual psychotherapy  I connected with [name] and/or Pietro Cassis Dauzat's self by Telephone  (Video is Caregility application) and verified that I am speaking with the correct person using two identifiers.Discussed confidentiality: Yes   I discussed the limitations of telemedicine and the availability of in person appointments.  Discussed there is a possibility of technology failure and discussed alternative modes of communication if that failure occurs.  I discussed that engaging in this telemedicine visit, they consent to the provision of behavioral healthcare and the services will be billed under their insurance.  Patient and/or legal guardian expressed understanding and consented to Telemedicine visit: Yes   Presenting Concerns: Patient and/or family reports the following symptoms/concerns: reduction in anxiety since initiation of Sertraline 50mg  daily. Pt related she lives w/her GM who is 27 or 27 yo. Pt does not see her Mother regularly; maybe every 3-4 yrs. Pt does not present any concerns for this situation-she has gotten used to it since 5 or 25 yo in [employer]. Neither her GM or herself has a car. This limits her getting out. Pt sts she would like to have someone she can talk to about her concerns.  Duration of problem: since Middle [employer]; Severity of problem: mild  Patient and/or Family's Strengths/Protective Factors: Social connections, Social and Emotional competence and Concrete supports in place (healthy food, safe environments, etc.), Pt takes time to warm up to  others. Pt is a thoughtful young lady, often bored-but good humored. She has two best friends; one she has known since First Grade-the other friend since H [employer]. Pt likes to watch anime & does meditations.  Goals Addressed: Patient will: 1.  Reduce symptoms of: anxiety, depression and mood instability  2.  Increase knowledge and/or ability of: coping skills and healthy habits  3.  Demonstrate ability to: Increase healthy adjustment to current life circumstances  Progress towards Goals: Estb'd today  Interventions: Interventions utilized:  Intake/Assessment Standardized Assessments completed: Not Needed  Patient and/or Family Response: Pt receptive to call today & warmed up quickly after a slow start. Pt requests future appt.  Assessment: Patient currently experiencing improvement in Sx of anx/dep since initiation of Sertraline 50mg . Reviewed proper administration of medication in on 24 hr period & clarified this is not a medicine taken prn.  Patient may benefit from supportive, listening ear. Encouragement for future goals & lifestyle changes that might benefit/improve weight issue.  Plan: 1. Follow up with behavioral health clinician on : 3 wks on telehealth for 30 min 2. Behavioral recommendations: Record your concerns over the next few wks to guide session. 3. Referral(s): Integrated [facility] (In Clinic)  I discussed the assessment and treatment plan with the patient and/or parent/guardian. They were provided an opportunity to ask questions and all were answered. They agreed with the plan and demonstrated an understanding of the instructions.   They were advised to call back or seek an in-person evaluation if the symptoms worsen or if the condition fails to improve as anticipated.  , LMFT

## 2020-12-03 ENCOUNTER — Encounter: Payer: Medicaid Other | Admitting: Student

## 2020-12-12 ENCOUNTER — Inpatient Hospital Stay: Admission: RE | Admit: 2020-12-12 | Payer: Medicaid Other | Source: Ambulatory Visit

## 2020-12-25 ENCOUNTER — Other Ambulatory Visit: Payer: Self-pay

## 2020-12-25 ENCOUNTER — Ambulatory Visit: Payer: Medicaid Other | Admitting: Behavioral Health

## 2020-12-25 DIAGNOSIS — F419 Anxiety disorder, unspecified: Secondary | ICD-10-CM

## 2020-12-25 NOTE — BH Specialist Note (Signed)
Integrated Behavioral Health via Telemedicine Visit  12/25/2020 Kathryn Andrews [id number]  Number of Integrated Behavioral Health visits: 2/6 Session Start time: 10:00am  Session End time: 10:30am Total time: 30  Referring Provider: Dr. Merrilyn Puma, MD Patient/Family location: Pt in private @ home (lives w/her maternal GM) [facility] Provider location: [facility] All persons participating in visit: Pt & Clinician Types of Service: Individual psychotherapy  I connected with Kathryn Andrews and/or Kathryn Andrews's self via  Telephone or Engineer, civil (consulting)  (Video is Caregility application) and verified that I am speaking with the correct person using two identifiers. Discussed confidentiality: Yes   I discussed the limitations of telemedicine and the availability of in person appointments.  Discussed there is a possibility of technology failure and discussed alternative modes of communication if that failure occurs.  I discussed that engaging in this telemedicine visit, they consent to the provision of behavioral healthcare and the services will be billed under their insurance.  Patient and/or legal guardian expressed understanding and consented to Telemedicine visit: Yes   Presenting Concerns: Patient and/or family reports the following symptoms/concerns: reduced anxiety since initiation of Sertraline 50mg  Duration of problem: months; Severity of problem: mild  Patient and/or Family's Strengths/Protective Factors: Social connections, Social and Emotional competence and Concrete supports in place (healthy food, safe environments, etc.)  Goals Addressed: Patient will: 1.  Reduce symptoms of: anxiety  2.  Increase knowledge and/or ability of: healthy habits and self-management skills  3.  Demonstrate ability to: Increase healthy adjustment to current life circumstances  Progress towards Goals: Ongoing  Interventions: Interventions utilized:  Solution-Focused  Strategies and Behavioral Activation Standardized Assessments completed: Not Needed  Patient and/or Family Response: Pt receptive to call today & requesting a later time in the day; 1 or 2pm in the afternoon is best.  Assessment: Patient currently experiencing reduced Sx of anxiety .   Patient may benefit from cont'd med mgmt & encouragement for a variety of activities. Pt likes to draw, & read. Pt likes to watch Anime, but does not draw much now.   Plan: 1. Follow up with behavioral health clinician on : 2-3 wks on telehealth 2. Behavioral recommendations: Complete HW task sent in the mail 3. Referral(s): Integrated (In Clinic)  I discussed the assessment and treatment plan with the patient and/or parent/guardian. They were provided an opportunity to ask questions and all were answered. They agreed with the plan and demonstrated an understanding of the instructions.   They were advised to call back or seek an in-person evaluation if the symptoms worsen or if the condition fails to improve as anticipated.  [employer], LMFT

## 2020-12-26 ENCOUNTER — Encounter: Payer: Medicaid Other | Admitting: Internal Medicine

## 2020-12-29 ENCOUNTER — Other Ambulatory Visit: Payer: Self-pay | Admitting: Internal Medicine

## 2020-12-29 DIAGNOSIS — F419 Anxiety disorder, unspecified: Secondary | ICD-10-CM

## 2020-12-29 DIAGNOSIS — F339 Major depressive disorder, recurrent, unspecified: Secondary | ICD-10-CM

## 2020-12-29 DIAGNOSIS — K580 Irritable bowel syndrome with diarrhea: Secondary | ICD-10-CM

## 2020-12-31 ENCOUNTER — Other Ambulatory Visit: Payer: Self-pay | Admitting: Internal Medicine

## 2020-12-31 NOTE — Progress Notes (Signed)
Duloxetine refill sent in Error. Lauren will call Pharmacy to D/C.  Patinet has been changed to different SSRI.

## 2020-12-31 NOTE — Telephone Encounter (Signed)
Cancelled Rx for Duloxetine with Melanie at [facility]. She was not able to determine if request for loratadine was system created or patient requested.

## 2021-01-01 ENCOUNTER — Inpatient Hospital Stay: Admission: RE | Admit: 2021-01-01 | Payer: Medicaid Other | Source: Ambulatory Visit

## 2021-01-15 ENCOUNTER — Ambulatory Visit: Payer: Medicaid Other | Admitting: Behavioral Health

## 2021-01-15 ENCOUNTER — Other Ambulatory Visit: Payer: Self-pay

## 2021-01-15 DIAGNOSIS — F331 Major depressive disorder, recurrent, moderate: Secondary | ICD-10-CM

## 2021-01-15 DIAGNOSIS — F419 Anxiety disorder, unspecified: Secondary | ICD-10-CM

## 2021-01-15 NOTE — BH Specialist Note (Signed)
Integrated Behavioral Health via Telemedicine Visit  01/15/2021 Kathryn Andrews [id number]  Number of Integrated Behavioral Health visits: 3/6 Session Start time: 2:15pm  Session End time: 2:30pm Total time: 15  Referring Provider: Dr. Merrilyn Puma, MD Patient/Family location: Pt at home w/GM in private [facility] Provider location: [facility] Office All persons participating in visit: Pt & Clinician Types of Service: Health Promotion  I connected with Kathryn Andrews and/or Kathryn Andrews self via  Telephone or [employer]  (Video is Caregility application) and verified that I am speaking with the correct person using two identifiers. Discussed confidentiality: Yes   I discussed the limitations of telemedicine and the availability of in person appointments.  Discussed there is a possibility of technology failure and discussed alternative modes of communication if that failure occurs.  I discussed that engaging in this telemedicine visit, they consent to the provision of behavioral healthcare and the services will be billed under their insurance.  Patient and/or legal guardian expressed understanding and consented to Telemedicine visit: Yes   Presenting Concerns: Patient and/or family reports the following symptoms/concerns: sleep schedul Duration of problem: months ; Severity of problem: moderate  Patient and/or Family's Strengths/Protective Factors: Social connections, Social and Emotional competence and Concrete supports in place (healthy food, safe environments, etc.)  Goals Addressed: Patient will: 1.  Reduce symptoms of: anxiety and depression  2.  Increase knowledge and/or ability of: healthy habits, self-management skills and stress reduction  3.  Demonstrate ability to: Increase healthy adjustment to current life circumstances  Progress towards Goals: Ongoing  Interventions: Interventions utilized:  Motivational Interviewing and Solution-Focused  Strategies Standardized Assessments completed: Not Needed  Patient and/or Family Response: Pt responsive to call today & requests future appt call for check-in  Assessment: Patient currently experiencing anx/dep due to circumstances surrounding COVID-19.   Patient may benefit from f/u on missed Podiatry appt to [facility]. Pt will schedule her transportation & appt w/Podiatry today.  Plan: 1. Follow up with behavioral health clinician on : Next available for 30 min on telehealth 2. Behavioral recommendations: Make appt & secure transportation to appt 3. Referral(s): Integrated [employer] (In Clinic)  I discussed the assessment and treatment plan with the patient and/or parent/guardian. They were provided an opportunity to ask questions and all were answered. They agreed with the plan and demonstrated an understanding of the instructions.   They were advised to call back or seek an in-person evaluation if the symptoms worsen or if the condition fails to improve as anticipated.  Deneise Lever, LMFT

## 2021-01-17 ENCOUNTER — Ambulatory Visit: Payer: Medicaid Other | Admitting: Internal Medicine

## 2021-01-17 ENCOUNTER — Other Ambulatory Visit: Payer: Self-pay

## 2021-01-17 ENCOUNTER — Other Ambulatory Visit: Payer: Self-pay | Admitting: Internal Medicine

## 2021-01-17 ENCOUNTER — Encounter: Payer: Self-pay | Admitting: Internal Medicine

## 2021-01-17 VITALS — BP 122/58 | HR 86 | Temp 98.7°F | Wt 399.1 lb

## 2021-01-17 DIAGNOSIS — F32A Depression, unspecified: Secondary | ICD-10-CM

## 2021-01-17 DIAGNOSIS — F419 Anxiety disorder, unspecified: Secondary | ICD-10-CM | POA: Diagnosis not present

## 2021-01-17 DIAGNOSIS — K580 Irritable bowel syndrome with diarrhea: Secondary | ICD-10-CM

## 2021-01-17 DIAGNOSIS — K30 Functional dyspepsia: Secondary | ICD-10-CM | POA: Diagnosis not present

## 2021-01-17 MED ORDER — SERTRALINE HCL 50 MG PO TABS: 100.0000 mg | ORAL_TABLET | Freq: Every day | ORAL | 3 refills | Status: DC

## 2021-01-17 MED ORDER — DICYCLOMINE HCL 10 MG PO CAPS
10.0000 mg | ORAL_CAPSULE | Freq: Four times a day (QID) | ORAL | 0 refills | Status: DC
Start: 2021-01-17 — End: 2021-03-06

## 2021-01-17 NOTE — Patient Instructions (Addendum)
Ms Anayla Giannetti,  It was a pleasure seeing you in clinic. Today we discussed:   Abdominal Pain: We are checking on some labs at this time to further evaluate the cause of your ongoing abdominal pain. At this time, you may try taking Bentyl 10mg  four times daily for symptomatic relief.  I am also sending a referral to the gastroenterologist for further work up.   Depression/Anxiety: At this time, we will increase your sertraline to 100mg  daily. Continue to follow up with Dr. .    If you have any questions or concerns, please call our clinic at [phone] between 9am-5pm and after hours call [phone] and ask for the internal medicine resident on call. If you feel you are having a medical emergency please call 911.   Thank you, we look forward to helping you remain healthy!  If you have not gotten the COVID vaccine, I recommend doing so:  You may get it at your local [facility] or [facility] OR To schedule an appointment for a COVID vaccine or be added to the vaccine wait list: Go to [phone]   OR Go to [phone]                  OR Call [phone]                                     OR Call [phone] and select Option 2

## 2021-01-18 LAB — BMP8+ANION GAP
Anion Gap: 15 mmol/L (ref 10.0–18.0)
BUN/Creatinine Ratio: 15 (ref 9–23)
BUN: 11 mg/dL (ref 6–20)
CO2: 22 mmol/L (ref 20–29)
Calcium: 9.4 mg/dL (ref 8.7–10.2)
Chloride: 105 mmol/L (ref 96–106)
Creatinine, Ser: 0.73 mg/dL (ref 0.57–1.00)
Glucose: 83 mg/dL (ref 65–99)
Potassium: 4.1 mmol/L (ref 3.5–5.2)
Sodium: 142 mmol/L (ref 134–144)
eGFR: 116 mL/min/{1.73_m2} (ref >59)

## 2021-01-18 LAB — TISSUE TRANSGLUTAMINASE ABS,IGG,IGA
Tissue Transglut Ab: 5 U/mL (ref 0–5)
Transglutaminase IgA: <2 U/mL (ref 0–3)

## 2021-01-18 NOTE — Assessment & Plan Note (Signed)
Patient is presenting with persistent abdominal pain/cramping, bloating and diarrhea. Symptoms have been persistent for ~1 year despite dietary changes and recently being tried on cymbalta. She notes ongoing abdominal cramping and burning sensation with all foods (less with Sushi) and notes that she has persistence of her abdominal pain until she is able to defecate. She notes that her stools are usually soft and light brown in color, up to 3-4 times per day. She denies any dark stools or bloody stools.  On prior labs, was noted to have hypokalemia. At this time, patient would benefit from further work up of her ongoing dyspepsia.   Plan:  H. Pylori stool testing Anti-TTG for celiac disease  Referral to GI for colonoscopy  Repeat BMP  Bentyl 10mg  qid for symptomatic relief

## 2021-01-18 NOTE — Progress Notes (Signed)
   CC: abdominal pain, diarrhea   HPI:  [name] is a 25 y.o. female with PMHx as stated below presenting for evaluation of ongoing abdominal pain, bloating and diarrhea. She was previously tried on SSRI to help with this but reports that it did not provide any significant relief. She reports significant diarrhea with anything that she eats. Please see problem based charting for complete assessment and plan.  Past Medical History:  Diagnosis Date  . Anxiety   . Asthma   . Attention deficit hyperactivity disorder   . Chest pain   . Chronic constipation   . Depression   . Morbid obesity (HCC) 03/02/2020   Review of Systems:  Negative except as stated in HPI.  Physical Exam:  Vitals:   01/17/21 1439  BP: (!) 122/58  Pulse: 86  Temp: 98.7 F (37.1 C)  TempSrc: Oral  Weight: (!) 399 lb 1.6 oz (181 kg)   Physical Exam  Constitutional: Obese female, well-developed and well-nourished. No distress.  HENT: Normocephalic and atraumatic, EOMI, conjunctiva normal, moist mucous membranes GI: Nondistended, soft, normoactive bowel sounds in all quadrants, tenderness to palpation in periumbilical region, LLQ and RLQ, no rebound or guarding  Musculoskeletal: Normal bulk and tone.  No peripheral edema noted. Neurological: Is alert and oriented x4, no apparent focal deficits noted. Skin: Warm and dry. Patches of dry skin noted on bilateral cheeks.  No rash, erythema, lesions noted. Psychiatric: Flat affect. Behavior is normal. Judgment and thought content normal.    Assessment & Plan:   See Encounters Tab for problem based charting.  Patient discussed with Dr. Oswaldo Done

## 2021-01-18 NOTE — Assessment & Plan Note (Signed)
Patient with flat affect and slightly depressed mood during our interaction. She is on zoloft 50mg  daily for this and also follows with Dr . PHQ9 remains elevated at 12. Discussed increasing her zoloft at this time with which patient is agreeable.  Plan; Increase to zoloft 100mg  daily  Continue to follow up with Dr Monna Fam

## 2021-01-18 NOTE — Progress Notes (Signed)
Internal Medicine Clinic Attending ° °Case discussed with Dr. Aslam  At the time of the visit.  We reviewed the resident’s history and exam and pertinent patient test results.  I agree with the assessment, diagnosis, and plan of care documented in the resident’s note.  °

## 2021-01-22 ENCOUNTER — Encounter: Payer: Self-pay | Admitting: Gastroenterology

## 2021-01-25 ENCOUNTER — Ambulatory Visit
Admission: RE | Admit: 2021-01-25 | Discharge: 2021-01-25 | Disposition: A | Discharge status: Home / Self Care | Payer: Medicaid Other | Source: Ambulatory Visit | Attending: Podiatry | Admitting: Podiatry

## 2021-01-25 ENCOUNTER — Other Ambulatory Visit: Payer: Self-pay

## 2021-01-25 DIAGNOSIS — S99921A Unspecified injury of right foot, initial encounter: Secondary | ICD-10-CM

## 2021-02-10 ENCOUNTER — Other Ambulatory Visit: Payer: Self-pay | Admitting: Internal Medicine

## 2021-02-10 DIAGNOSIS — F419 Anxiety disorder, unspecified: Secondary | ICD-10-CM

## 2021-02-10 DIAGNOSIS — K580 Irritable bowel syndrome with diarrhea: Secondary | ICD-10-CM

## 2021-02-10 DIAGNOSIS — F339 Major depressive disorder, recurrent, unspecified: Secondary | ICD-10-CM

## 2021-02-13 ENCOUNTER — Other Ambulatory Visit: Payer: Self-pay

## 2021-02-13 ENCOUNTER — Ambulatory Visit: Payer: Medicaid Other | Admitting: Behavioral Health

## 2021-02-13 DIAGNOSIS — F419 Anxiety disorder, unspecified: Secondary | ICD-10-CM

## 2021-02-13 DIAGNOSIS — F331 Major depressive disorder, recurrent, moderate: Secondary | ICD-10-CM

## 2021-02-13 NOTE — BH Specialist Note (Signed)
Integrated Behavioral Health via Telemedicine Visit  02/13/2021 Florabel Faulks [id number]  Number of Integrated Behavioral Health visits: 4/6 Session Start time: 2:30pm  Session End time: 3:00pm Total time: 30  Referring Provider: Dr. Merrilyn Puma, MD Patient/Family location: Pt in her room in private; living w/GM [facility] Provider location: [facility] Office All persons participating in visit: Pt & Clinician Types of Service: Individual psychotherapy  I connected with [name] and/or Pietro Cassis Netherton's self via  Telephone or [employer] (consulting)  (Video is Caregility application) and verified that I am speaking with the correct person using two identifiers. Discussed confidentiality: Yes   I discussed the limitations of telemedicine and the availability of in person appointments.  Discussed there is a possibility of technology failure and discussed alternative modes of communication if that failure occurs.  I discussed that engaging in this telemedicine visit, they consent to the provision of behavioral healthcare and the services will be billed under their insurance.  Patient and/or legal guardian expressed understanding and consented to Telemedicine visit: Yes   Presenting Concerns: Patient and/or family reports the following symptoms/concerns: elevated anx/dep due to living w/GM & butting heads over chores/tasks. Duration of problem: escalation since GM's return from eye surgery; Severity of problem: mild  Patient and/or Family's Strengths/Protective Factors: Social connections and Concrete supports in place (healthy food, safe environments, etc.)  Goals Addressed: Patient will: 1.  Reduce symptoms of: anxiety, depression and stress  2.  Increase knowledge and/or ability of: coping skills and stress reduction  3.  Demonstrate ability to: Increase healthy adjustment to current life circumstances  Progress towards Goals: Ongoing  Interventions: Interventions  utilized:  Supportive Counseling Standardized Assessments completed: Not Needed  Patient and/or Family Response: Pt difficult to reach today. Pt is being delivered her early dinner & she needs to pay for this. Pt aware of today's call. Reception d/c'd & returned directly to VM. Lft msg for Pt I can try her again on Thur.  Assessment: Patient currently experiencing elevated anx due to GM, "being demanding when I need a break."  Pt & GM are arguing & yelling. GM has just exp'd eye surgery & needs some help w/things. Pt has perception she is asking too much. Encouraged Pt briefly prior to phone being disconnected.  Patient may benefit from cont'd support & understanding, inc'd boundaries re: session calls, & empathy for GM's situation.  Plan: 1. Follow up with behavioral health clinician on : [place] Cao, May 12th btwn 10-11am. Pt sts she will be awake. 2. Behavioral recommendations: Enjoy your dinner & keep the peace w/GM. 3. Referral(s): Integrated [employer] (In Clinic)  I discussed the assessment and treatment plan with the patient and/or parent/guardian. They were provided an opportunity to ask questions and all were answered. They agreed with the plan and demonstrated an understanding of the instructions.   They were advised to call back or seek an in-person evaluation if the symptoms worsen or if the condition fails to improve as anticipated.  Deneise Lever, LMFT

## 2021-02-19 ENCOUNTER — Other Ambulatory Visit: Payer: Self-pay

## 2021-02-19 ENCOUNTER — Ambulatory Visit (INDEPENDENT_AMBULATORY_CARE_PROVIDER_SITE_OTHER): Payer: Medicaid Other | Admitting: Podiatry

## 2021-02-19 DIAGNOSIS — M778 Other enthesopathies, not elsewhere classified: Secondary | ICD-10-CM | POA: Diagnosis not present

## 2021-02-19 DIAGNOSIS — S99921A Unspecified injury of right foot, initial encounter: Secondary | ICD-10-CM | POA: Diagnosis not present

## 2021-02-20 ENCOUNTER — Encounter: Payer: Self-pay | Admitting: Podiatry

## 2021-02-20 NOTE — Progress Notes (Signed)
Subjective:  Patient ID: Kathryn Andrews, female    DOB: Jun 25, 1996,  MRN: [id number]  Chief Complaint  Patient presents with  . Foot Pain    MRI follow up     25 y.o. female presents with the above complaint.  Patient presents with pain to the right dorsal foot.  She states is still hurting.  She has not been very compliant with the boot.  She states that she lost her boot.  She denies any other acute complaints  Review of Systems: Negative except as noted in the HPI. Denies N/V/F/Ch.  Past Medical History:  Diagnosis Date  . Anxiety   . Asthma   . Attention deficit hyperactivity disorder   . Chest pain   . Chronic constipation   . Depression   . Morbid obesity (HCC) 03/02/2020    Current Outpatient Medications:  .  albuterol (VENTOLIN HFA) 108 (90 Base) MCG/ACT inhaler, Inhale 2 puffs into the lungs every 6 (six) hours as needed for wheezing or shortness of breath., Disp: 1 each, Rfl: 2 .  dicyclomine (BENTYL) 10 MG capsule, Take 1 capsule (10 mg total) by mouth 4 (four) times daily for 14 days., Disp: 56 capsule, Rfl: 0 .  hydrOXYzine (ATARAX/VISTARIL) 10 MG tablet, Take 1-2 tabs as needed for anxious feeling, up to three times a day.  This can cause sleepiness and dry mouth., Disp: 30 tablet, Rfl: 0 .  loratadine (CLARITIN) 10 MG tablet, TAKE 1 TABLET BY MOUTH EVERY DAY, Disp: 90 tablet, Rfl: 0 .  sertraline (ZOLOFT) 50 MG tablet, Take 2 tablets (100 mg total) by mouth daily., Disp: 60 tablet, Rfl: 3  Social History   Tobacco Use  Smoking Status Current Every Day Smoker  . Types: E-cigarettes  . Start date: 02/28/2019  Smokeless Tobacco Never Used  Tobacco Comment   Vapes     No Known Allergies Objective:  There were no vitals filed for this visit. There is no height or weight on file to calculate BMI. Constitutional Well developed. Well nourished.  Vascular Dorsalis pedis pulses palpable bilaterally. Posterior tibial pulses palpable bilaterally. Capillary refill  normal to all digits.  No cyanosis or clubbing noted. Pedal hair growth normal.  Neurologic Normal speech. Oriented to person, place, and time. Epicritic sensation to light touch grossly present bilaterally.  Dermatologic Nails well groomed and normal in appearance. No open wounds. No skin lesions.  Orthopedic:  Pain on palpation to the dorsal lateral foot on the right side.  Pain with resisted dorsiflexion plantarflexion of the digits.  No pain with range of motion of the metatarsophalangeal joint.     Radiographs: 3 views of skeletally mature adult right foot: No osseous abnormalities noted.  No fractures noted.  No osteoarthritic changes noted.  No Lisfranc injury.  1. No acute osseous abnormality of the right forefoot. 2. 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 3. Nonspecific subcutaneous edema over the dorsum of the foot. No organized fluid collection. Assessment:   1. Foot injury, right, initial encounter   2. Extensor tendinitis of foot    Plan:  Patient was evaluated and treated and all questions answered.  Right foot extensor tendinitis/foot injury -I spent patient the etiology of extensor tendinitis and various treatment options were extensively discussed.  Given the amount of pain she is having I believe patient will benefit from cam boot immobilization.   -I encouraged her to continue wearing cam boot.  She  states that she has not really been compliant with a cam boot.  She states he lost her cam boot.  A new prescription was given for cam boot. -MRI was reviewed with the patient and I discussed with her that there is nothing correlating with her pain that could explain on an MRI.  I discussed this findings with the patient.  Patient states understanding.  However already current encouraged her to utilize the boot as much as possible.  She states understanding.   No follow-ups on file.

## 2021-03-06 ENCOUNTER — Encounter: Payer: Self-pay | Admitting: Gastroenterology

## 2021-03-06 ENCOUNTER — Other Ambulatory Visit (INDEPENDENT_AMBULATORY_CARE_PROVIDER_SITE_OTHER): Payer: Medicaid Other

## 2021-03-06 ENCOUNTER — Ambulatory Visit (INDEPENDENT_AMBULATORY_CARE_PROVIDER_SITE_OTHER): Payer: Medicaid Other | Admitting: Gastroenterology

## 2021-03-06 VITALS — HR 86 | Ht 68.0 in | Wt 399.0 lb

## 2021-03-06 DIAGNOSIS — R140 Abdominal distension (gaseous): Secondary | ICD-10-CM

## 2021-03-06 DIAGNOSIS — R1013 Epigastric pain: Secondary | ICD-10-CM | POA: Diagnosis not present

## 2021-03-06 DIAGNOSIS — K529 Noninfective gastroenteritis and colitis, unspecified: Secondary | ICD-10-CM

## 2021-03-06 LAB — COMPREHENSIVE METABOLIC PANEL
ALT: 10 U/L (ref 0–35)
AST: 12 U/L (ref 0–37)
Albumin: 4.4 g/dL (ref 3.5–5.2)
Alkaline Phosphatase: 100 U/L (ref 39–117)
BUN: 15 mg/dL (ref 6–23)
CO2: 23 mEq/L (ref 19–32)
Calcium: 9.5 mg/dL (ref 8.4–10.5)
Chloride: 107 mEq/L (ref 96–112)
Creatinine, Ser: 0.82 mg/dL (ref 0.40–1.20)
GFR: 98.72 mL/min (ref >60.00)
Glucose, Bld: 88 mg/dL (ref 70–99)
Potassium: 3.9 mEq/L (ref 3.5–5.1)
Sodium: 139 mEq/L (ref 135–145)
Total Bilirubin: 0.5 mg/dL (ref 0.2–1.2)
Total Protein: 7.6 g/dL (ref 6.0–8.3)

## 2021-03-06 LAB — LIPASE: Lipase: 30 U/L (ref 11.0–59.0)

## 2021-03-06 LAB — CBC WITH DIFFERENTIAL/PLATELET
Basophils Absolute: 0 10*3/uL (ref 0.0–0.1)
Basophils Relative: 0.4 % (ref 0.0–3.0)
Eosinophils Absolute: 0.3 10*3/uL (ref 0.0–0.7)
Eosinophils Relative: 3.5 % (ref 0.0–5.0)
HCT: 41.5 % (ref 36.0–46.0)
Hemoglobin: 14 g/dL (ref 12.0–15.0)
Lymphocytes Relative: 25.7 % (ref 12.0–46.0)
Lymphs Abs: 2 10*3/uL (ref 0.7–4.0)
MCHC: 33.8 g/dL (ref 30.0–36.0)
MCV: 88.6 fl (ref 78.0–100.0)
Monocytes Absolute: 0.5 10*3/uL (ref 0.1–1.0)
Monocytes Relative: 6.3 % (ref 3.0–12.0)
Neutro Abs: 5.1 10*3/uL (ref 1.4–7.7)
Neutrophils Relative %: 64.1 % (ref 43.0–77.0)
Platelets: 278 10*3/uL (ref 150.0–400.0)
RBC: 4.68 Mil/uL (ref 3.87–5.11)
RDW: 13.9 % (ref 11.5–15.5)
WBC: 7.9 10*3/uL (ref 4.0–10.5)

## 2021-03-06 LAB — HIGH SENSITIVITY CRP: CRP, High Sensitivity: 8.21 mg/L — ABNORMAL HIGH (ref 0.000–5.000)

## 2021-03-06 LAB — SEDIMENTATION RATE: Sed Rate: 59 mm/hr — ABNORMAL HIGH (ref 0–20)

## 2021-03-06 LAB — TSH: TSH: 3.11 u[IU]/mL (ref 0.35–4.50)

## 2021-03-06 MED ORDER — SUPREP BOWEL PREP KIT 17.5-3.13-1.6 GM/177ML PO SOLN: 1.0000 | ORAL | 0 refills | Status: DC

## 2021-03-06 NOTE — Patient Instructions (Addendum)
If you are age 27 or younger, your body mass index should be between 19-25. Your Body mass index is 60.67 kg/m. If this is out of the aformentioned range listed, please consider follow up with your Primary Care Provider.    The [facility] providers would like to encourage you to use [facility] to communicate with providers for non-urgent requests or questions.  Due to long hold times on the telephone, sending your provider a message by [facility] may be a faster and more efficient way to get a response.  Please allow 48 business hours for a response.  Please remember that this is for non-urgent requests.   Your provider has requested that you go to the basement level for lab work before leaving today. Press "B" on the elevator. The lab is located at the first door on the left as you exit the elevator.  You have been scheduled for an abdominal ultrasound at [facility]  on 03/15/21 at 8:00am  [facility] Entrance A  . Please arrive 15 minutes prior to your appointment for registration. Make certain not to have anything to eat or drink 6 hours prior to your appointment. Should you need to reschedule your appointment, please contact radiology at [phone]. This test typically takes about 30 minutes to perform.  Due to recent changes in healthcare laws, you may see the results of your imaging and laboratory studies on MyChart before your provider has had a chance to review them.  We understand that in some cases there may be results that are confusing or concerning to you. Not all laboratory results come back in the same time frame and the provider may be waiting for multiple results in order to interpret others.  Please give [place] 48 hours in order for your provider to thoroughly review all the results before contacting the office for clarification of your results.   You have been scheduled for an endoscopy and colonoscopy. Please follow the written instructions given to you at your visit  today. Please pick up your prep supplies at the pharmacy within the next 1-3 days. If you use inhalers (even only as needed), please bring them with you on the day of your procedure.   We have sent the following medications to your pharmacy for you to pick up at your convenience: Suprep   Thank you for choosing me and [facility].  Dr. Meridee Score

## 2021-03-06 NOTE — Progress Notes (Signed)
Reserve VISIT   Primary Care Provider Seawell, Jaimie A, DO [address] [place] [place] [address] [phone]  Referring Provider Sharon Seller, Jaimie A, DO [address] [place],  [place] [address] [phone]  Patient Profile: Kathryn Andrews is a 25 y.o. female with a pmh significant for morbid obesity, asthma, MDD/anxiety, ADHD, ?IBS.  The patient presents to the [facility] for an evaluation and management of problem(s) noted below:  Problem List 1. Chronic diarrhea   2. Abdominal pain, epigastric   3. Postprandial epigastric pain   4. Bloating     History of Present Illness This is the patient's first visit to the outpatient [place] clinic.  She is referred by her primary care provider for further evaluation of chronic abdominal issues and pain postprandially as well as diarrhea with intermittent episodes of constipation.  States that she has been diagnosed with irritable bowel syndrome in the past.  She has been on medications to try and help with this but she reports that it has not been helpful.  She does not take fiber supplementation.  Patient has never had an endoscopic evaluation from above or below.  She does not take significant nonsteroidals.  Pain that she experiences is in the midportion of the abdomen and midepigastrium.  She describes discomfort being dull and burning at times and can occur within 15 to 45 minutes after eating.  After few hours it can subside but there are days where the pain lasts all day long.  She has not had abdominal imaging performed that she can recall.  Patient may have multiple episodes of diarrhea per day.  Though she may have some episodes/days of constipation.  GI Review of Systems Positive as above including pyrosis infrequently Negative for dysphagia, odynophagia, decreased appetite, early satiety, melena, hematochezia  Review of Systems General: Denies fevers/chills/weight loss  unintentionally HEENT: Denies oral lesions Cardiovascular: Denies chest pain/palpitations Pulmonary: Denies shortness of breath Gastroenterological: See HPI Genitourinary: Denies darkened urine or hematuria Hematological: Denies easy bruising/bleeding Endocrine: Denies temperature intolerance Dermatological: Denies jaundice Psychological: Mood is stable but hopeful that we can make a difference   Medications Current Outpatient Medications  Medication Sig Dispense Refill  . hydrOXYzine (ATARAX/VISTARIL) 10 MG tablet Take 1-2 tabs as needed for anxious feeling, up to three times a day.  This can cause sleepiness and dry mouth. 30 tablet 0  . loratadine (CLARITIN) 10 MG tablet TAKE 1 TABLET BY MOUTH EVERY DAY 90 tablet 0  . Na Sulfate-K Sulfate-Mg Sulf (SUPREP BOWEL PREP KIT) 17.5-3.13-1.6 GM/177ML SOLN Take 1 kit by mouth as directed. For colonoscopy prep 354 mL 0  . sertraline (ZOLOFT) 50 MG tablet Take 2 tablets (100 mg total) by mouth daily. 60 tablet 3   No current facility-administered medications for this visit.    Allergies No Known Allergies  Histories Past Medical History:  Diagnosis Date  . Anxiety   . Asthma   . Attention deficit hyperactivity disorder   . Chest pain   . Chronic constipation   . Depression   . Morbid obesity ([place]) 03/02/2020   Past Surgical History:  Procedure Laterality Date  . adenoid removal    . EXTERNAL EAR SURGERY    . WISDOM TOOTH EXTRACTION     Social History   Socioeconomic History  . Marital status: Single    Spouse name: Not on file  . Number of children: Not on file  . Years of education: Not on file  . Highest  education level: Not on file  Occupational History  . Not on file  Tobacco Use  . Smoking status: Current Every Day Smoker    Types: E-cigarettes    Start date: 02/28/2019  . Smokeless tobacco: Never Used  . Tobacco comment: Vapes   Substance and Sexual Activity  . Alcohol use: Yes    Comment: occasionally  .  Drug use: Yes    Types: Marijuana  . Sexual activity: Not on file  Other Topics Concern  . Not on file  Social History Narrative  . Not on file   Social Determinants of Health   Financial Resource Strain: Not on file  Food Insecurity: Not on file  Transportation Needs: Not on file  Physical Activity: Not on file  Stress: Not on file  Social Connections: Not on file  Intimate Partner Violence: Not on file   Family History  Problem Relation Age of Onset  . Diabetes Paternal Grandmother   . CAD Paternal Grandmother   . Hypertension Paternal Grandmother   . Hyperlipidemia Paternal Grandmother   . Colon cancer Neg Hx   . Esophageal cancer Neg Hx   . Inflammatory bowel disease Neg Hx   . Liver disease Neg Hx   . Pancreatic cancer Neg Hx   . Rectal cancer Neg Hx   . Stomach cancer Neg Hx    I have reviewed her medical, social, and family history in detail and updated the electronic medical record as necessary.    PHYSICAL EXAMINATION  Pulse 86   Ht '5\' 8"'  (1.727 m)   Wt (!) 399 lb (181 kg) Comment: Has boot on foot  BMI 60.67 kg/m  Wt Readings from Last 3 Encounters:  03/06/21 (!) 399 lb (181 kg)  01/17/21 (!) 399 lb 1.6 oz (181 kg)  03/28/20 (!) 417 lb 3.2 oz (189.2 kg)  GEN: NAD, appears stated age, doesn't appear chronically ill PSYCH: Cooperative, without pressured speech EYE: Conjunctivae pink, sclerae anicteric ENT: MMM, without oral ulcers CV: Nontachycardic RESP: No audible wheezing GI: Obese, rounded, no rebound or guarding, unable to appreciate hepatosplenomegaly due to body habitus  MSK/EXT: Slight lower extremity edema present SKIN: No jaundice NEURO:  Alert & Oriented x 3, no focal deficits   REVIEW OF DATA  I reviewed the following data at the time of this encounter:  GI Procedures and Studies  No relevant studies to review  Laboratory Studies  Reviewed those in epic  Imaging Studies  No relevant studies to review   ASSESSMENT  Ms. Ammirati  is a 25 y.o. female with a pmh significant for morbid obesity, asthma, MDD/anxiety, ADHD, ?IBS.  The patient is seen today for evaluation and management of:  1. Chronic diarrhea   2. Abdominal pain, epigastric   3. Postprandial epigastric pain   4. Bloating    The patient is hemodynamically stable.  Clinically she has had longstanding issues.  Further evaluation of her postprandial abdominal discomfort and diarrhea symptoms is important and necessary.  Possibility of underlying IBS remains but we need to rule out other potential causes or reasons for her symptoms.  Diagnostic evaluation will begin with laboratory evaluation as well as stool studies.  We will check inflammatory markers as well.  Diagnostic of endoscopy from above and below is recommended unless something is found on her laboratory/stool studies.  The risks and benefits of endoscopic evaluation were discussed with the patient; these include but are not limited to the risk of perforation, infection, bleeding, missed lesions, lack  of diagnosis, severe illness requiring hospitalization, as well as anesthesia and sedation related illnesses.  The patient is agreeable to proceed.  All patient questions were answered to the best of my ability, and the patient agrees to the aforementioned plan of action with follow-up as indicated.   PLAN  Laboratories as outlined below H. pylori stool antigen to be obtained Fecal elastase to be obtained Stool studies otherwise outlined below Inflammatory markers to be obtained EGD/colonoscopy diagnostic to be performed Abdominal ultrasound to be obtained Consider SIBO evaluation in future   Orders Placed This Encounter  Procedures  . Procedural/ Surgical Case Request: COLONOSCOPY WITH PROPOFOL, ESOPHAGOGASTRODUODENOSCOPY (EGD) WITH PROPOFOL  . Helicobacter pylori special antigen  . Stool Culture  . Ova and parasite examination  . US Abdomen Complete  . CBC w/Diff  . Comp Met (CMET)  . Lipase  .  TSH  . Sedimentation rate  . CRP High sensitivity  . Clostridium difficile Toxin B, Qualitative, Real-Time PCR  . Pancreatic elastase, fecal  . Ambulatory referral to Gastroenterology    New Prescriptions   NA SULFATE-K SULFATE-MG SULF (SUPREP BOWEL PREP KIT) 17.5-3.13-1.6 GM/177ML SOLN    Take 1 kit by mouth as directed. For colonoscopy prep   Modified Medications   No medications on file    Planned Follow Up No follow-ups on file.   Total Time in Face-to-Face and in Coordination of Care for patient including independent/personal interpretation/review of prior testing, medical history, examination, medication adjustment, communicating results with the patient directly, and documentation with the EHR is 45 minutes.   Justice Britain, MD [facility] Office # [phone]

## 2021-03-08 ENCOUNTER — Encounter: Payer: Self-pay | Admitting: Gastroenterology

## 2021-03-08 DIAGNOSIS — R1013 Epigastric pain: Secondary | ICD-10-CM

## 2021-03-08 DIAGNOSIS — R140 Abdominal distension (gaseous): Secondary | ICD-10-CM

## 2021-03-08 DIAGNOSIS — K529 Noninfective gastroenteritis and colitis, unspecified: Secondary | ICD-10-CM

## 2021-03-08 HISTORY — DX: Epigastric pain: R10.13

## 2021-03-08 HISTORY — DX: Noninfective gastroenteritis and colitis, unspecified: K52.9

## 2021-03-08 HISTORY — DX: Abdominal distension (gaseous): R14.0

## 2021-03-12 ENCOUNTER — Ambulatory Visit: Payer: Medicaid Other | Admitting: Behavioral Health

## 2021-03-12 ENCOUNTER — Other Ambulatory Visit: Payer: Self-pay

## 2021-03-12 DIAGNOSIS — F331 Major depressive disorder, recurrent, moderate: Secondary | ICD-10-CM

## 2021-03-12 NOTE — BH Specialist Note (Signed)
Integrated Behavioral Health via Telemedicine Visit  03/12/2021 Kathryn Andrews [id number]  Number of Integrated Behavioral Health visits: 5/6 Session Start time: 2:30pm  Session End time: 3:00pm Total time: 30  Referring Provider: Dr. Merrilyn Puma, MD Patient/Family location: Pt in room @ home in private [employer] Provider location: [facility] Office All persons participating in visit: Pt & Clinician Types of Service: Individual psychotherapy  I connected with [name] and/or Pietro Cassis Lueth's self via  Telephone or Engineer, civil (consulting)  (Video is Caregility application) and verified that I am speaking with the correct person using two identifiers. Discussed confidentiality: Yes   I discussed the limitations of telemedicine and the availability of in person appointments.  Discussed there is a possibility of technology failure and discussed alternative modes of communication if that failure occurs.  I discussed that engaging in this telemedicine visit, they consent to the provision of behavioral healthcare and the services will be billed under their insurance.  Patient and/or legal guardian expressed understanding and consented to Telemedicine visit: Yes   Presenting Concerns: Patient and/or family reports the following symptoms/concerns: elevated concerns about her sleep hygiene - issues existed pre-COVID-19 Duration of problem: yrs; Severity of problem: mild - moderate  Patient and/or Family's Strengths/Protective Factors: Social connections  Goals Addressed: Patient will: 1.  Reduce symptoms of: anxiety and depression  2.  Increase knowledge and/or ability of: coping skills and healthy habits  3.  Demonstrate ability to: Increase healthy adjustment to current life circumstances  Progress towards Goals: Ongoing  Interventions: Interventions utilized:  Behavioral Activation and Supportive Counseling Standardized Assessments completed: screeners prn  Patient and/or  Family Response: Pt receptive to call today & requests future appt  Assessment: Patient currently experiencing steady level of anx/dep & stressors w/GM have reduced.  Patient may benefit from cont'd chk-ins re: anx/dep Sx. Pt would benefit from being busy during the day. Exer would also help her sleep. Sleep hygiene HO sent today.  Plan: 1. Follow up with behavioral health clinician on : 2-3 wks on telehealth for 30 min 2. Behavioral recommendations: Regulate your sleep hygiene using the HO I am sending. Do not take 40mg  of Melatonin. Call the Triage RN & ask for Physician recommendations. 3. Referral(s): Integrated (In Clinic)  I discussed the assessment and treatment plan with the patient and/or parent/guardian. They were provided an opportunity to ask questions and all were answered. They agreed with the plan and demonstrated an understanding of the instructions.   They were advised to call back or seek an in-person evaluation if the symptoms worsen or if the condition fails to improve as anticipated.  [employer], LMFT

## 2021-03-13 ENCOUNTER — Ambulatory Visit: Payer: Medicaid Other | Admitting: Podiatry

## 2021-03-15 ENCOUNTER — Ambulatory Visit (HOSPITAL_COMMUNITY): Admission: RE | Admit: 2021-03-15 | Payer: Medicaid Other | Source: Ambulatory Visit

## 2021-03-19 ENCOUNTER — Other Ambulatory Visit: Payer: Self-pay | Admitting: Internal Medicine

## 2021-03-19 DIAGNOSIS — F419 Anxiety disorder, unspecified: Secondary | ICD-10-CM

## 2021-03-19 DIAGNOSIS — K580 Irritable bowel syndrome with diarrhea: Secondary | ICD-10-CM

## 2021-03-19 DIAGNOSIS — F339 Major depressive disorder, recurrent, unspecified: Secondary | ICD-10-CM

## 2021-03-20 ENCOUNTER — Other Ambulatory Visit: Payer: Self-pay

## 2021-03-21 MED ORDER — LORATADINE 10 MG PO TABS: 10.0000 mg | ORAL_TABLET | Freq: Every day | ORAL | 0 refills | Status: DC

## 2021-03-22 ENCOUNTER — Encounter: Payer: Self-pay | Admitting: *Deleted

## 2021-03-27 ENCOUNTER — Telehealth: Payer: Self-pay | Admitting: *Deleted

## 2021-03-27 NOTE — Telephone Encounter (Signed)
Call from pt's grandmother, Kathryn Andrews. Stated pt needs her anxiety medication and our office has been refusing it. I asked the name of the med - stated it started with a "D". Duloxetine was discontinued 12/31/20 per chart. Informed pt will need an appt to discuss with the doctor. Stated they ride transportation and will be not be able to come until next week, Thursday. Appt schedule 6/29 @ 1315 PM w/Dr Sande Brothers.

## 2021-04-03 ENCOUNTER — Encounter: Payer: Medicaid Other | Admitting: Internal Medicine

## 2021-04-04 ENCOUNTER — Other Ambulatory Visit: Payer: Self-pay

## 2021-04-04 ENCOUNTER — Ambulatory Visit (INDEPENDENT_AMBULATORY_CARE_PROVIDER_SITE_OTHER): Payer: Medicaid Other | Admitting: Student

## 2021-04-04 ENCOUNTER — Encounter: Payer: Self-pay | Admitting: Student

## 2021-04-04 DIAGNOSIS — K30 Functional dyspepsia: Secondary | ICD-10-CM | POA: Diagnosis not present

## 2021-04-04 DIAGNOSIS — F419 Anxiety disorder, unspecified: Secondary | ICD-10-CM | POA: Diagnosis present

## 2021-04-04 DIAGNOSIS — L739 Follicular disorder, unspecified: Secondary | ICD-10-CM | POA: Diagnosis not present

## 2021-04-04 DIAGNOSIS — F32A Depression, unspecified: Secondary | ICD-10-CM | POA: Diagnosis not present

## 2021-04-04 MED ORDER — SERTRALINE HCL 100 MG PO TABS
100.0000 mg | ORAL_TABLET | Freq: Every day | ORAL | 3 refills | Status: DC
Start: 2021-04-04 — End: 2021-11-22

## 2021-04-04 NOTE — Patient Instructions (Signed)
It was a pleasure seeing you in clinic. Today we discussed:   Anxiety : Please take Zoloft 50 mg twice daily for 1 month and then increase to 100 mg daily, Follow up with Dr. Monna Fam on 7/5  Neck Pain: Apply hot compresses to the neck to help with pain and swelling  Follow up in 2 months  If you have any questions or concerns, please call our clinic at [phone] between 9am-5pm and after hours call [phone] and ask for the internal medicine resident on call. If you feel you are having a medical emergency please call 911.   Thank you, we look forward to helping you remain healthy!

## 2021-04-05 DIAGNOSIS — L739 Follicular disorder, unspecified: Secondary | ICD-10-CM | POA: Insufficient documentation

## 2021-04-05 NOTE — Progress Notes (Signed)
   CC: anxiety  HPI:  [name] is a 25 y.o. female with a past history listed below presents for follow up of anxiety. Please refer to problem based charting for further details and assessment and plan of current problem and chronic medical conditions.   Past Medical History:  Diagnosis Date   Anxiety    Asthma    Attention deficit hyperactivity disorder    Chest pain    Chronic constipation    Depression    Morbid obesity ([facility]) 03/02/2020   Review of Systems:  negative as per HPI  Physical Exam:  Vitals:   04/04/21 1409  BP: 127/82  Pulse: 66  Temp: 98 F (36.7 C)  TempSrc: Oral  SpO2: 99%  Weight: (!) 403 lb 1.6 oz (182.8 kg)  Height: 5\' 8"  (1.727 m)   Constitutional: Appears well-developed and obese. No distress.  HENT: Normocephalic and atraumatic, EOMI, conjunctiva normal, moist mucous membranes Cardiovascular: Normal rate, regular rhythm,  Distal pulses intact Respiratory:Effort is normal.  Lungs are clear to auscultation bilaterally. GI: Nondistended, soft, nontender to palpation, normal active bowel sounds Musculoskeletal: Normal bulk and tone.  No peripheral edema noted. Neurological: Is alert and oriented x4, no apparent focal deficits noted. Skin: Warm and dry.  Small nodule 0.5 cm nodule of right posterior neck,no erythema, no warmth, no fluctuance Psychiatric: anxious appearing, flat affect Assessment & Plan:   See Encounters Tab for problem based charting.  Patient discussed with Dr. 

## 2021-04-05 NOTE — Assessment & Plan Note (Signed)
Patient with worsening anxiety and depression since running out of zoloft about 1 month ago. Noted improvement in symptoms at 100 mg compared to 50 mg. Denies issues with accessing the medication. PHQ9 elevated to 22 during office visit today. Given she has been off of her medications for a month will retitrate her zoloft.  - Zoloft 50 mg daily for 1 month, followed up 100 mg daily - Follow up in 2 months  - Continue to follow up with Dr. Monna Fam

## 2021-04-05 NOTE — Assessment & Plan Note (Signed)
Patient reports a painful nodule on the back of her neck for the past several days. Notes similar nodule a few weeks ago that resolved spontaneously. Denies fever, chills, pain with neck movement, drainage, or rash. On exam small nodule at right posterior hairline likely due to folliculitis. No signs of cellulitis.   - apply hot compresses to area

## 2021-04-05 NOTE — Assessment & Plan Note (Signed)
Patient continue to have symptoms of abdominal pain, bloating, and diarrhea. Seen by Dr. Meridee Score on 6/1 with plan for Colonscopy in August. Anti TTG unremarkable.  - Follow up with GI

## 2021-04-08 NOTE — Progress Notes (Signed)
Internal Medicine Clinic Attending ? ?Case discussed with Dr. Liang  At the time of the visit.  We reviewed the resident?s history and exam and pertinent patient test results.  I agree with the assessment, diagnosis, and plan of care documented in the resident?s note. ? ?

## 2021-04-09 ENCOUNTER — Telehealth: Payer: Self-pay | Admitting: Behavioral Health

## 2021-04-09 ENCOUNTER — Ambulatory Visit: Payer: Medicaid Other | Admitting: Behavioral Health

## 2021-04-09 NOTE — Telephone Encounter (Signed)
Tried Pt cell phone to contact for today's telehealth session. Pt does not have a VM set-up & Provider could not lv msg. Called GM's cell & spoke briefly. GM sts Pt is @ a friend's house & likely will not answer her phone. Let GM know Pt can call [facility] & r/s.  Dr. Monna Fam

## 2021-04-29 NOTE — Addendum Note (Signed)
Addended by: Quincy Simmonds on: 04/29/2021 04:07 PM   Modules accepted: Orders

## 2021-05-07 ENCOUNTER — Other Ambulatory Visit: Payer: Self-pay | Admitting: Internal Medicine

## 2021-05-07 DIAGNOSIS — K580 Irritable bowel syndrome with diarrhea: Secondary | ICD-10-CM

## 2021-05-07 DIAGNOSIS — F419 Anxiety disorder, unspecified: Secondary | ICD-10-CM

## 2021-05-07 DIAGNOSIS — F339 Major depressive disorder, recurrent, unspecified: Secondary | ICD-10-CM

## 2021-05-08 ENCOUNTER — Encounter (HOSPITAL_COMMUNITY): Payer: Self-pay | Admitting: Physician Assistant

## 2021-05-08 NOTE — Progress Notes (Signed)
Unable to reach patient via phone.  Left a detailed message on machine with instructions for DOS.  PCP - Dr Quincy Simmonds Cardiologist - n/a  Chest x-ray - 10/11/20 (1V) EKG - n/a Stress Test - n/a ECHO - n/a Cardiac Cath - n/a  Sleep Study -  n/a CPAP - none  Anesthesia review: Yes  Coronavirus Screening Covid test n/a - Ambulatory Surgery

## 2021-05-09 ENCOUNTER — Ambulatory Visit (HOSPITAL_COMMUNITY): Admission: RE | Admit: 2021-05-09 | Payer: Medicaid Other | Source: Home / Self Care | Admitting: Gastroenterology

## 2021-05-09 ENCOUNTER — Telehealth: Payer: Self-pay | Admitting: Gastroenterology

## 2021-05-09 SURGERY — COLONOSCOPY WITH PROPOFOL
Anesthesia: Monitor Anesthesia Care

## 2021-05-09 NOTE — Telephone Encounter (Signed)
Inbound call from grandmother, [name]. Called in about missed procedure at [facility]. States the transportation never came to get them. Would like to reschedule.

## 2021-05-09 NOTE — Anesthesia Preprocedure Evaluation (Deleted)
Anesthesia Evaluation    Reviewed: Allergy & Precautions, Patient's Chart, lab work & pertinent test results  Airway        Dental   Pulmonary asthma , Current Smoker,           Cardiovascular negative cardio ROS       Neuro/Psych PSYCHIATRIC DISORDERS Anxiety Depression negative neurological ROS     GI/Hepatic negative GI ROS, Neg liver ROS,   Endo/Other  Morbid obesity (BMI 61)  Renal/GU negative Renal ROS  negative genitourinary   Musculoskeletal negative musculoskeletal ROS (+)   Abdominal   Peds  (+) ADHD Hematology negative hematology ROS (+)   Anesthesia Other Findings Colonoscopy for abdominal pain and diarrhea  Reproductive/Obstetrics                             Anesthesia Physical Anesthesia Plan  ASA: 3  Anesthesia Plan: MAC   Post-op Pain Management:    Induction: Intravenous  PONV Risk Score and Plan: 1 and Propofol infusion and Treatment may vary due to age or medical condition  Airway Management Planned: Natural Airway  Additional Equipment:   Intra-op Plan:   Post-operative Plan:   Informed Consent: I have reviewed the patients History and Physical, chart, labs and discussed the procedure including the risks, benefits and alternatives for the proposed anesthesia with the patient or authorized representative who has indicated his/her understanding and acceptance.     Dental advisory given  Plan Discussed with: CRNA  Anesthesia Plan Comments:         Anesthesia Quick Evaluation

## 2021-05-10 ENCOUNTER — Other Ambulatory Visit: Payer: Self-pay

## 2021-05-10 DIAGNOSIS — R140 Abdominal distension (gaseous): Secondary | ICD-10-CM

## 2021-05-10 DIAGNOSIS — K529 Noninfective gastroenteritis and colitis, unspecified: Secondary | ICD-10-CM

## 2021-05-10 DIAGNOSIS — R1013 Epigastric pain: Secondary | ICD-10-CM

## 2021-05-10 MED ORDER — NA SULFATE-K SULFATE-MG SULF 17.5-3.13-1.6 GM/177ML PO SOLN: 1.0000 | Freq: Once | ORAL | 0 refills | Status: AC

## 2021-05-10 NOTE — Telephone Encounter (Signed)
The pt has been rescheduled to 07/01/21 at 830 am [facility] with Dr Meridee Score Prep sent.  Instructions have been sent to the pt home.    Tried to reach the pt at home and all calls ended when the person on the other end hung up..   All information has been mailed.

## 2021-06-15 ENCOUNTER — Other Ambulatory Visit: Payer: Self-pay | Admitting: Internal Medicine

## 2021-06-15 DIAGNOSIS — J309 Allergic rhinitis, unspecified: Secondary | ICD-10-CM

## 2021-06-30 ENCOUNTER — Encounter (HOSPITAL_COMMUNITY): Payer: Self-pay | Admitting: Anesthesiology

## 2021-06-30 NOTE — Anesthesia Preprocedure Evaluation (Deleted)
Anesthesia Evaluation    Reviewed: Allergy & Precautions, H&P , Patient's Chart, lab work & pertinent test results  Airway        Dental   Pulmonary neg pulmonary ROS, asthma , Current Smoker,           Cardiovascular Exercise Tolerance: Good negative cardio ROS       Neuro/Psych Anxiety Depression negative neurological ROS  negative psych ROS   GI/Hepatic negative GI ROS, Neg liver ROS,   Endo/Other  negative endocrine ROSMorbid obesity  Renal/GU negative Renal ROS  negative genitourinary   Musculoskeletal   Abdominal   Peds  Hematology negative hematology ROS (+)   Anesthesia Other Findings   Reproductive/Obstetrics negative OB ROS                             Anesthesia Physical Anesthesia Plan  ASA: 3  Anesthesia Plan: MAC   Post-op Pain Management:    Induction: Intravenous  PONV Risk Score and Plan: 1 and Propofol infusion  Airway Management Planned: Nasal Cannula  Additional Equipment:   Intra-op Plan:   Post-operative Plan:   Informed Consent: I have reviewed the patients History and Physical, chart, labs and discussed the procedure including the risks, benefits and alternatives for the proposed anesthesia with the patient or authorized representative who has indicated his/her understanding and acceptance.       Plan Discussed with:   Anesthesia Plan Comments:         Anesthesia Quick Evaluation

## 2021-07-01 ENCOUNTER — Ambulatory Visit (HOSPITAL_COMMUNITY): Admission: RE | Admit: 2021-07-01 | Payer: Medicaid Other | Source: Home / Self Care | Admitting: Gastroenterology

## 2021-07-01 ENCOUNTER — Encounter (HOSPITAL_COMMUNITY): Admission: RE | Payer: Self-pay | Source: Home / Self Care

## 2021-07-01 SURGERY — COLONOSCOPY WITH PROPOFOL
Anesthesia: Monitor Anesthesia Care

## 2021-07-01 MED ORDER — PROPOFOL 1000 MG/100ML IV EMUL
INTRAVENOUS | Status: AC
  Filled 2021-07-01: qty 100

## 2021-07-01 NOTE — Progress Notes (Signed)
Recall entered for 2 month recall.

## 2021-07-11 ENCOUNTER — Telehealth: Payer: Self-pay

## 2021-07-11 NOTE — Telephone Encounter (Signed)
-----   Message from Lemar Lofty., MD sent at 07/11/2021  8:26 AM EDT ----- Regarding: Follow-up on this patient Kathryn Andrews, Waynard Edwards, whom I just sent you a message about is this patient's grandmother for whom they live together. I spoke with Kiante this morning and she is okay with her grandmother being called for update and to help with scheduling her procedures. We had placed a recall in the system but she can go ahead and get rescheduled for her procedures in the hospital-based setting. Please call her grandmother Marshawn Ninneman, [birth year]) on rescheduling this patient's procedures. Thanks. GM

## 2021-07-11 NOTE — Telephone Encounter (Signed)
The colon endo has been rescheduled to 08/12/21 at 730 am at [facility]  Instructions mailed to the home.  Left message on machine to call back

## 2021-07-12 NOTE — Telephone Encounter (Signed)
Left message on machine to call back  

## 2021-07-15 NOTE — Telephone Encounter (Signed)
Left message on machine to call back  

## 2021-07-16 NOTE — Telephone Encounter (Signed)
I have been unable to reach the pt by phone will mail letter.  Instructions have been mailed to the home

## 2021-07-22 ENCOUNTER — Other Ambulatory Visit: Payer: Self-pay

## 2021-07-22 MED ORDER — NA SULFATE-K SULFATE-MG SULF 17.5-3.13-1.6 GM/177ML PO SOLN: 1.0000 | ORAL | 0 refills | Status: DC

## 2021-07-22 NOTE — Telephone Encounter (Signed)
Received a call from the pt mother.  She was advised of the appt and instructions.  She is aware that all information has been mailed.  She will call with an questions or concerns.

## 2021-08-01 ENCOUNTER — Encounter (HOSPITAL_COMMUNITY): Payer: Self-pay | Admitting: Gastroenterology

## 2021-08-01 NOTE — Progress Notes (Signed)
Attempted to obtain medical history via telephone, unable to reach at this time. Phone not set up for voicemail to return pre surgical testing department's phone call.

## 2021-08-07 ENCOUNTER — Telehealth: Payer: Self-pay

## 2021-08-07 NOTE — Telephone Encounter (Signed)
Requesting all meds to be filled @ [id number] [place], [place] - [address]

## 2021-08-07 NOTE — Telephone Encounter (Signed)
[facility] is already selected as pharmacy as choice and all medications on med list were sent to that pharmacy.   SCha[place]in, RN,BSN

## 2021-08-10 NOTE — Anesthesia Preprocedure Evaluation (Addendum)
Anesthesia Evaluation  Patient identified by MRN, date of birth, ID band Patient awake    Reviewed: Allergy & Precautions, H&P , NPO status , Patient's Chart, lab work & pertinent test results  Airway Mallampati: III  TM Distance: >3 FB Neck ROM: Full    Dental no notable dental hx. (+) Teeth Intact, Dental Advisory Given   Pulmonary asthma , Current SmokerPatient did not abstain from smoking.,    Pulmonary exam normal breath sounds clear to auscultation       Cardiovascular Exercise Tolerance: Good negative cardio ROS   Rhythm:Regular Rate:Normal     Neuro/Psych Anxiety Depression negative neurological ROS     GI/Hepatic negative GI ROS, Neg liver ROS,   Endo/Other  Morbid obesity  Renal/GU negative Renal ROS  negative genitourinary   Musculoskeletal   Abdominal   Peds  (+) ADHD Hematology negative hematology ROS (+)   Anesthesia Other Findings   Reproductive/Obstetrics negative OB ROS                            Anesthesia Physical Anesthesia Plan  ASA: 3  Anesthesia Plan: MAC   Post-op Pain Management:    Induction: Intravenous  PONV Risk Score and Plan: 1 and Propofol infusion  Airway Management Planned: Simple Face Mask  Additional Equipment:   Intra-op Plan:   Post-operative Plan:   Informed Consent: I have reviewed the patients History and Physical, chart, labs and discussed the procedure including the risks, benefits and alternatives for the proposed anesthesia with the patient or authorized representative who has indicated his/her understanding and acceptance.     Dental advisory given  Plan Discussed with: CRNA and Surgeon  Anesthesia Plan Comments:        Anesthesia Quick Evaluation

## 2021-08-12 ENCOUNTER — Other Ambulatory Visit: Payer: Self-pay

## 2021-08-12 ENCOUNTER — Ambulatory Visit (HOSPITAL_COMMUNITY)
Admission: RE | Admit: 2021-08-12 | Discharge: 2021-08-12 | Disposition: A | Discharge status: Home Health | Payer: Medicaid Other | Attending: Gastroenterology | Admitting: Gastroenterology

## 2021-08-12 ENCOUNTER — Ambulatory Visit (HOSPITAL_COMMUNITY): Admit: 2021-08-12 | Payer: Medicaid Other | Admitting: Gastroenterology

## 2021-08-12 ENCOUNTER — Other Ambulatory Visit: Payer: Self-pay | Admitting: Internal Medicine

## 2021-08-12 ENCOUNTER — Ambulatory Visit (HOSPITAL_COMMUNITY): Payer: Medicaid Other | Admitting: Anesthesiology

## 2021-08-12 ENCOUNTER — Encounter (HOSPITAL_COMMUNITY): Payer: Self-pay | Admitting: Gastroenterology

## 2021-08-12 ENCOUNTER — Encounter (HOSPITAL_COMMUNITY)
Admission: RE | Disposition: A | Discharge status: Home Health | Payer: Self-pay | Source: Home / Self Care | Attending: Gastroenterology

## 2021-08-12 ENCOUNTER — Encounter (HOSPITAL_COMMUNITY): Payer: Self-pay

## 2021-08-12 DIAGNOSIS — K641 Second degree hemorrhoids: Secondary | ICD-10-CM | POA: Insufficient documentation

## 2021-08-12 DIAGNOSIS — R140 Abdominal distension (gaseous): Secondary | ICD-10-CM | POA: Insufficient documentation

## 2021-08-12 DIAGNOSIS — K2289 Other specified disease of esophagus: Secondary | ICD-10-CM | POA: Insufficient documentation

## 2021-08-12 DIAGNOSIS — K449 Diaphragmatic hernia without obstruction or gangrene: Secondary | ICD-10-CM | POA: Diagnosis not present

## 2021-08-12 DIAGNOSIS — R197 Diarrhea, unspecified: Secondary | ICD-10-CM | POA: Insufficient documentation

## 2021-08-12 DIAGNOSIS — K2950 Unspecified chronic gastritis without bleeding: Secondary | ICD-10-CM | POA: Diagnosis not present

## 2021-08-12 DIAGNOSIS — K3189 Other diseases of stomach and duodenum: Secondary | ICD-10-CM | POA: Diagnosis not present

## 2021-08-12 DIAGNOSIS — Z6841 Body Mass Index (BMI) 40.0 and over, adult: Secondary | ICD-10-CM | POA: Diagnosis not present

## 2021-08-12 DIAGNOSIS — F419 Anxiety disorder, unspecified: Secondary | ICD-10-CM

## 2021-08-12 DIAGNOSIS — K621 Rectal polyp: Secondary | ICD-10-CM

## 2021-08-12 DIAGNOSIS — K580 Irritable bowel syndrome with diarrhea: Secondary | ICD-10-CM

## 2021-08-12 DIAGNOSIS — R1013 Epigastric pain: Secondary | ICD-10-CM | POA: Diagnosis present

## 2021-08-12 DIAGNOSIS — F339 Major depressive disorder, recurrent, unspecified: Secondary | ICD-10-CM

## 2021-08-12 DIAGNOSIS — K644 Residual hemorrhoidal skin tags: Secondary | ICD-10-CM | POA: Insufficient documentation

## 2021-08-12 DIAGNOSIS — F17210 Nicotine dependence, cigarettes, uncomplicated: Secondary | ICD-10-CM | POA: Insufficient documentation

## 2021-08-12 DIAGNOSIS — J45909 Unspecified asthma, uncomplicated: Secondary | ICD-10-CM | POA: Insufficient documentation

## 2021-08-12 HISTORY — PX: POLYPECTOMY: SHX5525

## 2021-08-12 HISTORY — PX: COLONOSCOPY WITH PROPOFOL: SHX5780

## 2021-08-12 HISTORY — PX: ESOPHAGOGASTRODUODENOSCOPY (EGD) WITH PROPOFOL: SHX5813

## 2021-08-12 HISTORY — PX: BIOPSY: SHX5522

## 2021-08-12 SURGERY — EGD (ESOPHAGOGASTRODUODENOSCOPY)
Anesthesia: Monitor Anesthesia Care

## 2021-08-12 SURGERY — COLONOSCOPY WITH PROPOFOL
Anesthesia: Monitor Anesthesia Care

## 2021-08-12 MED ORDER — ESOMEPRAZOLE MAGNESIUM 40 MG PO CPDR: 40.0000 mg | DELAYED_RELEASE_CAPSULE | Freq: Every day | ORAL | 6 refills | Status: DC

## 2021-08-12 MED ORDER — ALBUTEROL SULFATE (2.5 MG/3ML) 0.083% IN NEBU
INHALATION_SOLUTION | RESPIRATORY_TRACT | Status: AC
  Filled 2021-08-12: qty 3

## 2021-08-12 MED ORDER — PHENYLEPHRINE 40 MCG/ML (10ML) SYRINGE FOR IV PUSH (FOR BLOOD PRESSURE SUPPORT)
PREFILLED_SYRINGE | INTRAVENOUS | Status: DC | PRN
  Administered 2021-08-12 (×2): 80 ug via INTRAVENOUS

## 2021-08-12 MED ORDER — ALBUTEROL SULFATE (2.5 MG/3ML) 0.083% IN NEBU
2.5000 mg | INHALATION_SOLUTION | Freq: Once | RESPIRATORY_TRACT | Status: AC
  Administered 2021-08-12: 2.5 mg via RESPIRATORY_TRACT

## 2021-08-12 MED ORDER — DEXMEDETOMIDINE (PRECEDEX) IN NS 20 MCG/5ML (4 MCG/ML) IV SYRINGE
PREFILLED_SYRINGE | INTRAVENOUS | Status: DC | PRN
  Administered 2021-08-12: 12 ug via INTRAVENOUS
  Administered 2021-08-12: 8 ug via INTRAVENOUS

## 2021-08-12 MED ORDER — PROPOFOL 500 MG/50ML IV EMUL
INTRAVENOUS | Status: DC | PRN
  Administered 2021-08-12: 150 ug/kg/min via INTRAVENOUS

## 2021-08-12 MED ORDER — LIDOCAINE HCL (CARDIAC) PF 100 MG/5ML IV SOSY
PREFILLED_SYRINGE | INTRAVENOUS | Status: DC | PRN
  Administered 2021-08-12: 60 mg via INTRAVENOUS

## 2021-08-12 MED ORDER — SODIUM CHLORIDE 0.9 % IV SOLN: INTRAVENOUS | Status: DC

## 2021-08-12 MED ORDER — LACTATED RINGERS IV SOLN: INTRAVENOUS | Status: DC | PRN

## 2021-08-12 SURGICAL SUPPLY — 25 items

## 2021-08-12 NOTE — Transfer of Care (Signed)
Immediate Anesthesia Transfer of Care Note  Patient: Kathryn Andrews  Procedure(s) Performed: COLONOSCOPY WITH PROPOFOL ESOPHAGOGASTRODUODENOSCOPY (EGD) WITH PROPOFOL BIOPSY POLYPECTOMY  Patient Location: Endoscopy Unit  Anesthesia Type:MAC  Level of Consciousness: awake  Airway & Oxygen Therapy: Patient Spontanous Breathing and Patient connected to face mask oxygen  Post-op Assessment: Report given to RN and Post -op Vital signs reviewed and stable  Post vital signs: Reviewed and stable  Last Vitals:  Vitals Value Taken Time  BP 137/53 08/12/21 0826  Temp    Pulse 74 08/12/21 0827  Resp 34 08/12/21 0827  SpO2 100 % 08/12/21 0827  Vitals shown include unvalidated device data.  Last Pain:  Vitals:   08/12/21 0725  TempSrc: Oral  PainSc: 0-No pain         Complications: No notable events documented.

## 2021-08-12 NOTE — Discharge Instructions (Signed)
YOU HAD AN ENDOSCOPIC PROCEDURE TODAY: Refer to the procedure report and other information in the discharge instructions given to you for any specific questions about what was found during the examination. If this information does not answer your questions, please call [place] office at [phone] to clarify.  ° °YOU SHOULD EXPECT: Some feelings of bloating in the abdomen. Passage of more gas than usual. Walking can help get rid of the air that was put into your GI tract during the procedure and reduce the bloating. If you had a lower endoscopy (such as a colonoscopy or flexible sigmoidoscopy) you may notice spotting of blood in your stool or on the toilet paper. Some abdominal soreness may be present for a day or two, also. ° °DIET: Your first meal following the procedure should be a light meal and then it is ok to progress to your normal diet. A half-sandwich or bowl of soup is an example of a good first meal. Heavy or fried foods are harder to digest and may make you feel nauseous or bloated. Drink plenty of fluids but you should avoid alcoholic beverages for 24 hours. If you had a esophageal dilation, please see attached instructions for diet.   ° °ACTIVITY: Your care partner should take you home directly after the procedure. You should plan to take it easy, moving slowly for the rest of the day. You can resume normal activity the day after the procedure however YOU SHOULD NOT DRIVE, use power tools, machinery or perform tasks that involve climbing or major physical exertion for 24 hours (because of the sedation medicines used during the test).  ° °SYMPTOMS TO REPORT IMMEDIATELY: °A gastroenterologist can be reached at any hour. Please call [phone]  for any of the following symptoms:  °Following lower endoscopy (colonoscopy, flexible sigmoidoscopy) °Excessive amounts of blood in the stool  °Significant tenderness, worsening of abdominal pains  °Swelling of the abdomen that is new, acute  °Fever of 100° or  higher  °Following upper endoscopy (EGD, EUS, ERCP, esophageal dilation) °Vomiting of blood or coffee ground material  °New, significant abdominal pain  °New, significant chest pain or pain under the shoulder blades  °Painful or persistently difficult swallowing  °New shortness of breath  °Black, tarry-looking or red, bloody stools ° °FOLLOW UP:  °If any biopsies were taken you will be contacted by phone or by letter within the next 1-3 weeks. Call [phone]  if you have not heard about the biopsies in 3 weeks.  °Please also call with any specific questions about appointments or follow up tests. ° °

## 2021-08-12 NOTE — H&P (Signed)
GASTROENTEROLOGY PROCEDURE H&P NOTE   Primary Care Physician: Gwenevere Abbot, MD  HPI: Kathryn Andrews is a 25 y.o. female who presents for EGD/Colonoscopy for evaluation of epigastric abdominal pain, post-prandial pain, bloating, chronic diarrhea.  Past Medical History:  Diagnosis Date   Anxiety    Asthma    Attention deficit hyperactivity disorder    Chest pain    Chronic constipation    Depression    Morbid obesity (HCC) 03/02/2020   Past Surgical History:  Procedure Laterality Date   adenoid removal     EXTERNAL EAR SURGERY     WISDOM TOOTH EXTRACTION     No current facility-administered medications for this encounter.   No current facility-administered medications for this encounter. No Known Allergies Family History  Problem Relation Age of Onset   Diabetes Paternal Grandmother    CAD Paternal Grandmother    Hypertension Paternal Grandmother    Hyperlipidemia Paternal Grandmother    Colon cancer Neg Hx    Esophageal cancer Neg Hx    Inflammatory bowel disease Neg Hx    Liver disease Neg Hx    Pancreatic cancer Neg Hx    Rectal cancer Neg Hx    Stomach cancer Neg Hx    Social History   Socioeconomic History   Marital status: Single    Spouse name: Not on file   Number of children: Not on file   Years of education: Not on file   Highest education level: Not on file  Occupational History   Not on file  Tobacco Use   Smoking status: Every Day    Types: E-cigarettes    Start date: 02/28/2019   Smokeless tobacco: Never   Tobacco comments:    Vapes   Substance and Sexual Activity   Alcohol use: Yes    Comment: occasionally   Drug use: Yes    Types: Marijuana   Sexual activity: Not on file  Other Topics Concern   Not on file  Social History Narrative   Not on file   Social Determinants of Health   Financial Resource Strain: Not on file  Food Insecurity: Not on file  Transportation Needs: Not on file  Physical Activity: Not on file  Stress: Not  on file  Social Connections: Not on file  Intimate Partner Violence: Not on file    Physical Exam: Today's Vitals   08/12/21 0725  BP: 125/70  Resp: 18  Temp: 98.5 F (36.9 C)  TempSrc: Oral  SpO2: 99%  Weight: (!) 195 kg  Height: 5\' 8"  (1.727 m)  PainSc: 0-No pain   Body mass index is 65.38 kg/m. GEN: NAD EYE: Sclerae anicteric ENT: MMM CV: Non-tachycardic GI: Soft, NT/ND NEURO:  Alert & Oriented x 3  Lab Results: No results for input(s): WBC, HGB, HCT, PLT in the last 72 hours. BMET No results for input(s): NA, K, CL, CO2, GLUCOSE, BUN, CREATININE, CALCIUM in the last 72 hours. LFT No results for input(s): PROT, ALBUMIN, AST, ALT, ALKPHOS, BILITOT, BILIDIR, IBILI in the last 72 hours. PT/INR No results for input(s): LABPROT, INR in the last 72 hours.   Impression / Plan: This is a 25 y.o.female who presents for EGD/Colonoscopy for evaluation of epigastric abdominal pain, post-prandial pain, bloating, chronic diarrhea.  The risks and benefits of endoscopic evaluation/treatment were discussed with the patient and/or family; these include but are not limited to the risk of perforation, infection, bleeding, missed lesions, lack of diagnosis, severe illness requiring hospitalization, as well as anesthesia  and sedation related illnesses.  The patient's history has been reviewed, patient examined, no change in status, and deemed stable for procedure.  The patient and/or family is agreeable to proceed.    Corliss Parish, MD [facility] Office # [phone]

## 2021-08-12 NOTE — Op Note (Signed)
[facility] Patient Name: Kathryn Andrews Procedure Date: 08/12/2021 MRN: [id number] Attending MD: Corliss Parish , MD Date of Birth: 11/17/1995 CSN: [id number] Age: 25 Admit Type: Outpatient Procedure:                Upper GI endoscopy Indications:              Epigastric abdominal pain, Abdominal bloating,                            Diarrhea Providers:                Corliss Parish, MD, Estella Husk RN, RN,                            Salley Scarlet, Technician, Gladys Damme, CRNA, Referring MD:             Gwenevere Abbot Medicines:                Monitored Anesthesia Care Complications:            No immediate complications. Estimated Blood Loss:     Estimated blood loss was minimal. Procedure:                Pre-Anesthesia Assessment:                           - Prior to the procedure, a History and Physical                            was performed, and patient medications and                            allergies were reviewed. The patient's tolerance of                            previous anesthesia was also reviewed. The risks                            and benefits of the procedure and the sedation                            options and risks were discussed with the patient.                            All questions were answered, and informed consent                            was obtained. Prior Anticoagulants: The patient has                            taken no previous anticoagulant or antiplatelet                            agents. ASA Grade Assessment: III - A patient with  severe systemic disease. After reviewing the risks                            and benefits, the patient was deemed in                            satisfactory condition to undergo the procedure.                           After obtaining informed consent, the endoscope was                            passed under direct vision. Throughout the                             procedure, the patient's blood pressure, pulse, and                            oxygen saturations were monitored continuously. The                            [id number]-H190 ([id number]) Olympus endoscope was introduced                            through the mouth, and advanced to the second part                            of duodenum. The upper GI endoscopy was                            accomplished without difficulty. The patient                            tolerated the procedure. Scope In: Scope Out: Findings:      No gross lesions were noted in the entire esophagus.      The Z-line was irregular and was found 39 cm from the incisors.      A 1 cm hiatal hernia was present.      Striped moderately erythematous mucosa without bleeding was found in the       gastric antrum.      No other gross lesions were noted in the entire examined stomach.       Biopsies were taken with a cold forceps for histology and Helicobacter       pylori testing.      No gross lesions were noted in the duodenal bulb, in the first portion       of the duodenum and in the second portion of the duodenum. Biopsies were       taken with a cold forceps for histology. Impression:               - No gross lesions in esophagus. Z-line irregular,                            39 cm from the incisors.                           -  1 cm hiatal hernia.                           - Erythematous mucosa in the antrum. No other gross                            lesions in the stomach. Biopsied.                           - No gross lesions in the duodenal bulb, in the                            first portion of the duodenum and in the second                            portion of the duodenum. Biopsied. Moderate Sedation:      Not Applicable - Patient had care per Anesthesia. Recommendation:           - Proceed to scheduled colonoscopy.                           - Observe patient's clinical course.                           - Await  pathology results.                           - Recommend initiation of Nexium 40 mg twice daily.                           - The findings and recommendations were discussed                            with the patient.                           - The findings and recommendations were discussed                            with the patient's family. Procedure Code(s):        --- Professional ---                           [phone], Esophagogastroduodenoscopy, flexible,                            transoral; with biopsy, single or multiple Diagnosis Code(s):        --- Professional ---                           K22.8, Other specified diseases of esophagus                           K31.89, Other diseases of stomach and duodenum  R10.13, Epigastric pain                           R14.0, Abdominal distension (gaseous)                           R19.7, Diarrhea, unspecified CPT copyright 2019 American Medical Association. All rights reserved. The codes documented in this report are preliminary and upon coder review may  be revised to meet current compliance requirements. Corliss Parish, MD 08/12/2021 8:29:31 AM Number of Addenda: 0

## 2021-08-12 NOTE — Anesthesia Procedure Notes (Signed)
Procedure Name: MAC Date/Time: 08/12/2021 7:45 AM Performed by: Lieutenant Diego, CRNA Pre-anesthesia Checklist: Patient identified, Emergency Drugs available, Patient being monitored, Suction available and Timeout performed Patient Re-evaluated:Patient Re-evaluated prior to induction Oxygen Delivery Method: Simple face mask Preoxygenation: Pre-oxygenation with 100% oxygen Induction Type: IV induction

## 2021-08-12 NOTE — Op Note (Signed)
[facility] Patient Name: Kathryn Andrews Procedure Date: 08/12/2021 MRN: [id number] Attending MD: Justice Britain , MD Date of Birth: 01/23/1996 CSN: [id number] Age: 25 Admit Type: Outpatient Procedure:                Colonoscopy Indications:              Epigastric abdominal pain, Chronic diarrhea Providers:                Justice Britain, MD, Kary Kos RN, RN,                            Luan Moore, Technician, Signa Kell, CRNA, Referring MD:             Idamae Schuller Medicines:                Monitored Anesthesia Care Complications:            No immediate complications. Estimated Blood Loss:     Estimated blood loss was minimal. Procedure:                Pre-Anesthesia Assessment:                           - Prior to the procedure, a History and Physical                            was performed, and patient medications and                            allergies were reviewed. The patient's tolerance of                            previous anesthesia was also reviewed. The risks                            and benefits of the procedure and the sedation                            options and risks were discussed with the patient.                            All questions were answered, and informed consent                            was obtained. Prior Anticoagulants: The patient has                            taken no previous anticoagulant or antiplatelet                            agents. ASA Grade Assessment: III - A patient with                            severe systemic disease. After reviewing the risks  and benefits, the patient was deemed in                            satisfactory condition to undergo the procedure.                           After obtaining informed consent, the colonoscope                            was passed under direct vision. Throughout the                            procedure, the patient's blood  pressure, pulse, and                            oxygen saturations were monitored continuously. The                            [id number]-HQ190L ([id number]) Olympus colonoscope was                            introduced through the anus and advanced to the 10                            cm into the ileum. The colonoscopy was performed                            without difficulty. The patient tolerated the                            procedure. The quality of the bowel preparation was                            adequate. The terminal ileum, ileocecal valve,                            appendiceal orifice, and rectum were photographed. Scope In: 8:04:23 AM Scope Out: 8:16:55 AM Scope Withdrawal Time: 0 hours 9 minutes 17 seconds  Total Procedure Duration: 0 hours 12 minutes 32 seconds  Findings:      The digital rectal exam findings include hemorrhoids. Pertinent       negatives include no palpable rectal lesions.      The terminal ileum and ileocecal valve appeared normal. Biopsies were       taken with a cold forceps for histology to rule out chronic ileitis.      A 4 mm polyp was found in the rectum. The polyp was sessile.      Normal mucosa was found in the entire colon. Biopsies for histology were       taken with a cold forceps from the entire colon for evaluation of       microscopic/chronic colitis.      Non-bleeding non-thrombosed external and internal hemorrhoids were found       during retroflexion, during perianal exam and during digital exam. The       hemorrhoids were Grade II (internal hemorrhoids  that prolapse but reduce       spontaneously). Impression:               - Hemorrhoids found on digital rectal exam.                           - The examined portion of the ileum was normal.                            Biopsied.                           - One 4 mm polyp in the rectum.                           - Normal mucosa in the entire examined colon                            otherwise.  Biopsied.                           - Non-bleeding non-thrombosed external and internal                            hemorrhoids. Moderate Sedation:      Not Applicable - Patient had care per Anesthesia. Recommendation:           - The patient will be observed post-procedure,                            until all discharge criteria are met.                           - Discharge patient to home.                           - Patient has a contact number available for                            emergencies. The signs and symptoms of potential                            delayed complications were discussed with the                            patient. Return to normal activities tomorrow.                            Written discharge instructions were provided to the                            patient.                           - High fiber diet.                           -  Monitor for signs/symptoms of bleeding,                            perforation, and infection. If issues please call                            our number to get further assistance as needed.                           - Await pathology results.                           - Pending the final pathology will consider                            anti-motility agents +/- Cholestyramine in the                            future.                           - Repeat colonoscopy for screening purposes at age                            27 unless evidence of chronic colitis is found.                           - The findings and recommendations were discussed                            with the patient.                           - The findings and recommendations were discussed                            with the patient's family. Procedure Code(s):        --- Professional ---                           [phone], Colonoscopy, flexible; with biopsy, single                            or multiple Diagnosis Code(s):        --- Professional ---                            K64.1, Second degree hemorrhoids                           K62.1, Rectal polyp                           K52.9, Noninfective gastroenteritis and colitis,                            unspecified CPT copyright  2019 American Medical Association. All rights reserved. The codes documented in this report are preliminary and upon coder review may  be revised to meet current compliance requirements. Justice Britain, MD 08/12/2021 8:35:36 AM Number of Addenda: 0

## 2021-08-12 NOTE — Progress Notes (Signed)
Dr Meridee Score in to see patient.  Lungs with slight wheezing.  Orders received for albuterol nebulizer once received.  Discussed with anesthesia and in agreement.  Nebulizer given

## 2021-08-12 NOTE — Anesthesia Postprocedure Evaluation (Signed)
Anesthesia Post Note  Patient: Kathryn Andrews  Procedure(s) Performed: COLONOSCOPY WITH PROPOFOL ESOPHAGOGASTRODUODENOSCOPY (EGD) WITH PROPOFOL BIOPSY POLYPECTOMY     Patient location during evaluation: Endoscopy Anesthesia Type: MAC Level of consciousness: awake and alert Pain management: pain level controlled Vital Signs Assessment: post-procedure vital signs reviewed and stable Respiratory status: spontaneous breathing, nonlabored ventilation and respiratory function stable Cardiovascular status: stable and blood pressure returned to baseline Postop Assessment: no apparent nausea or vomiting Anesthetic complications: no   No notable events documented.  Last Vitals:  Vitals:   08/12/21 0850 08/12/21 0855  BP: (!) 114/58 127/67  Pulse: 65 63  Resp: 20 (!) 23  Temp:    SpO2: 100% 92%    Last Pain:  Vitals:   08/12/21 0855  TempSrc:   PainSc: 0-No pain                 Mortimer Bair,W. EDMOND

## 2021-08-13 ENCOUNTER — Encounter: Payer: Self-pay | Admitting: Gastroenterology

## 2021-08-13 ENCOUNTER — Encounter (HOSPITAL_COMMUNITY): Payer: Self-pay | Admitting: Gastroenterology

## 2021-08-13 LAB — SURGICAL PATHOLOGY

## 2021-08-15 ENCOUNTER — Encounter: Payer: Medicaid Other | Admitting: Student

## 2021-08-22 IMAGING — MR MR FOOT*R* W/O CM
5 series · 40 of 40 positions shown · non-contrast
Comparison: X-ray 08/01/2020

CLINICAL DATA: Right forefoot pain and swelling for 1 year

EXAM:
MRI OF THE RIGHT FOREFOOT WITHOUT CONTRAST
TECHNIQUE: Multiplanar, multisequence MR imaging of the right forefoot was
performed. No intravenous contrast was administered.

[Series 4: T1 · coronal · right · 3.0mm · 0.41mm/px · 10 of 50 slices shown (1 of 2)]
[im 1/50]
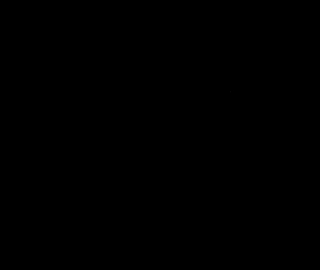
[im 6/50]
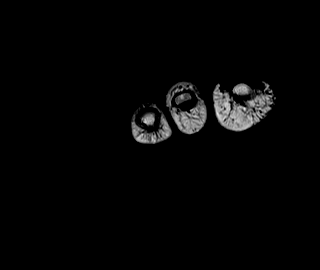
[im 11/50]
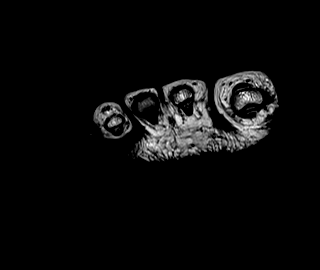
[im 17/50]
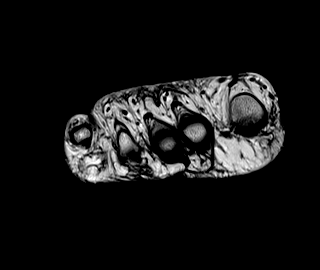
[im 22/50]
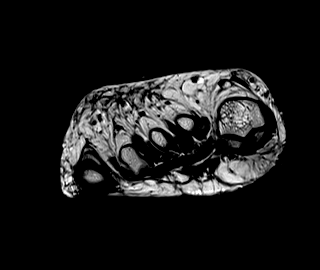
[im 28/50]
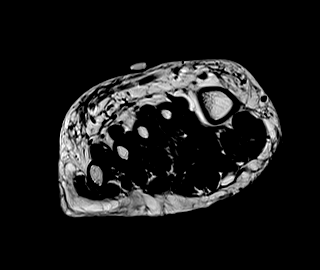
[im 33/50]
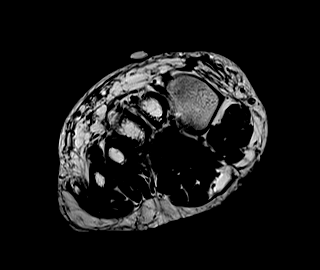
[im 39/50]
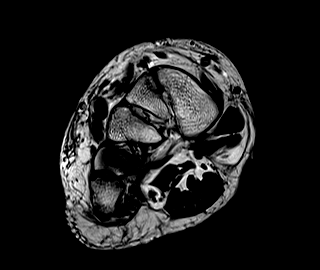
[im 44/50]
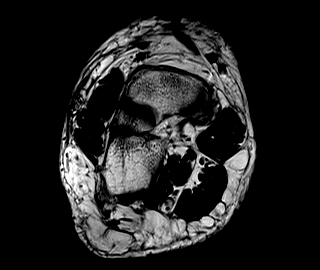
[im 50/50]
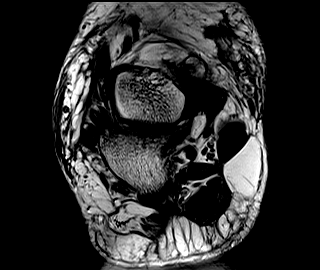

[Series 5: T2 fat-sat · coronal · right · 3.0mm · 0.41mm/px · 11 of 50 slices shown (1 of 2)]
[im 1/50]
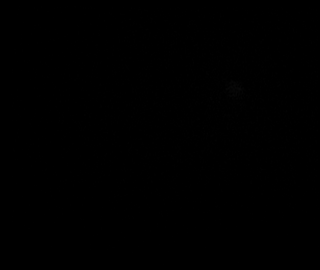
[im 5/50]
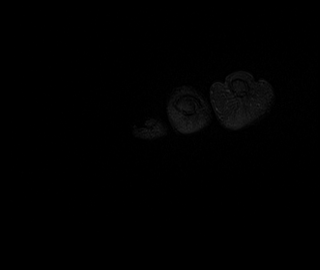
[im 10/50]
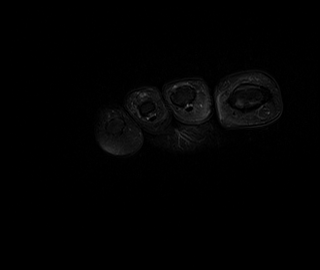
[im 15/50]
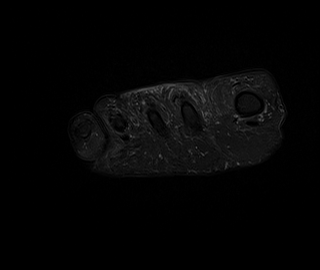
[im 20/50]
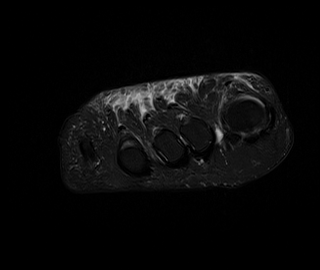
[im 25/50]
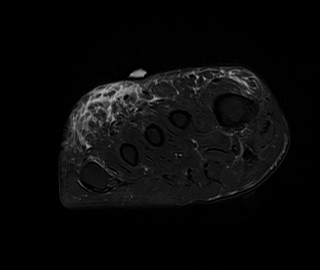
[im 30/50]
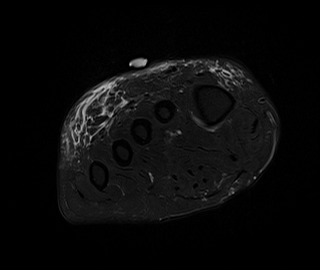
[im 35/50]
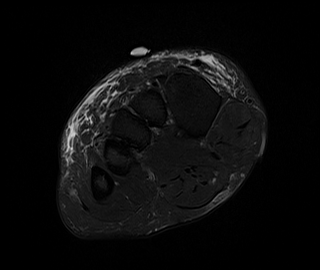
[im 40/50]
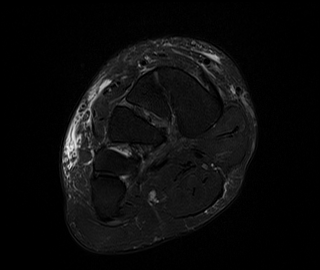
[im 45/50]
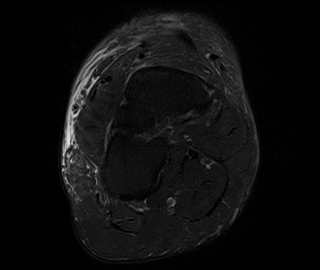
[im 50/50]
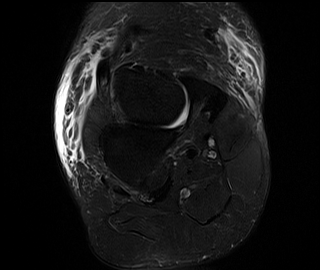

[Series 6: STIR · sagittal · right · 3.0mm · 0.70mm/px · 7 of 31 slices shown]
[im 1/31]
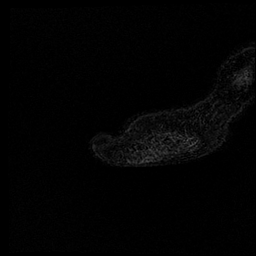
[im 6/31]
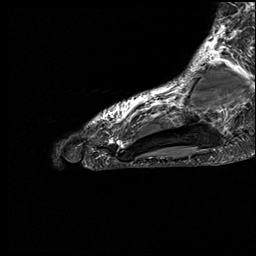
[im 11/31]
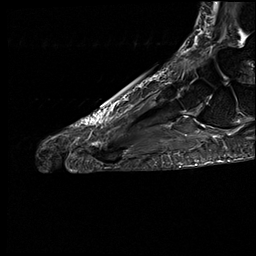
[im 16/31]
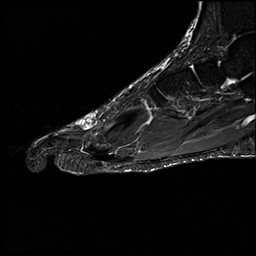
[im 21/31]
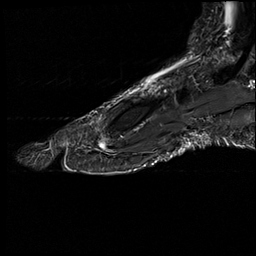
[im 26/31]
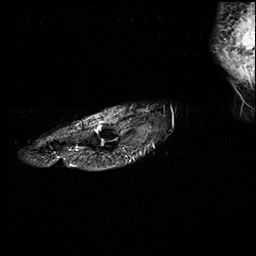
[im 31/31]
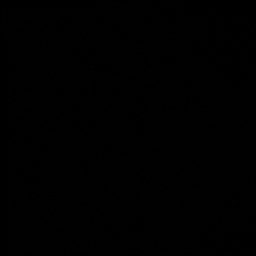

[Series 7: T1 · axial · right · 3.0mm · 0.47mm/px · z∈[-101,-6]mm · 6 of 28 slices shown (2 of 2)]
[im 1/28]
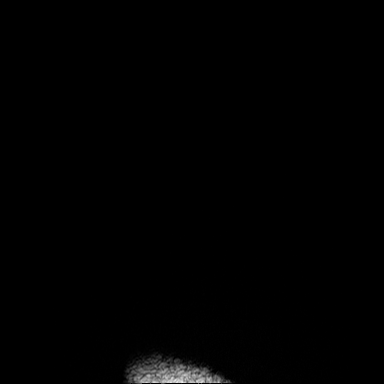
[im 6/28]
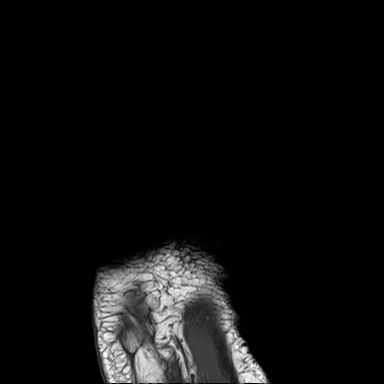
[im 11/28]
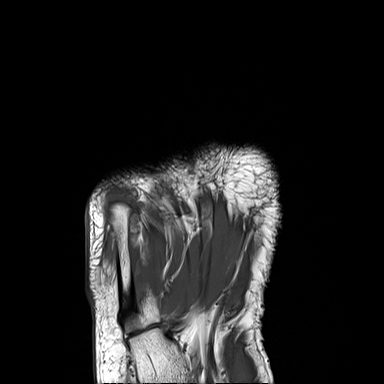
[im 17/28]
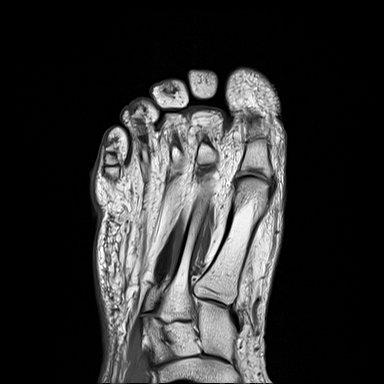
[im 22/28]
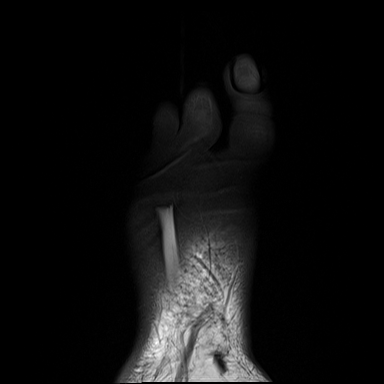
[im 28/28]
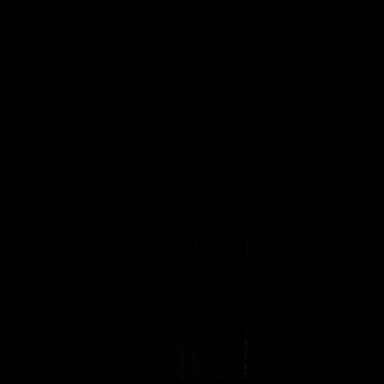

[Series 8: T2 fat-sat · axial · right · 3.0mm · 0.47mm/px · z∈[-101,-6]mm · 6 of 28 slices shown (2 of 2)]
[im 1/28]
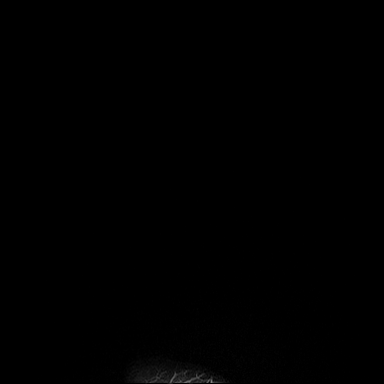
[im 6/28]
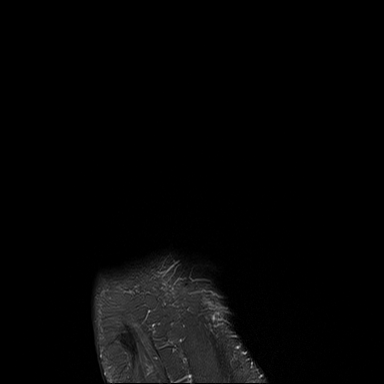
[im 11/28]
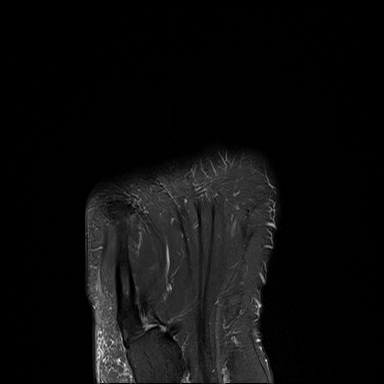
[im 17/28]
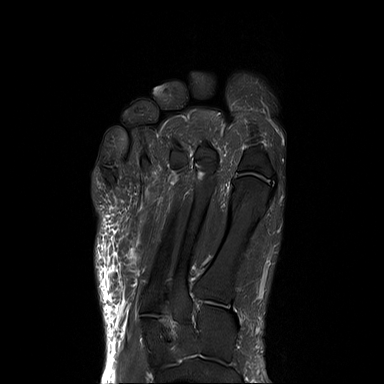
[im 22/28]
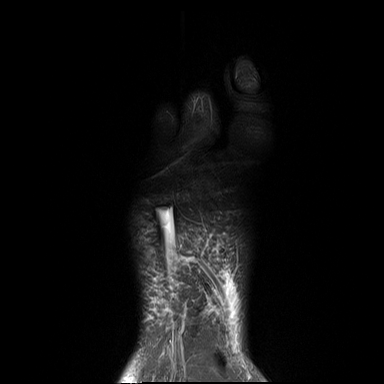
[im 28/28]
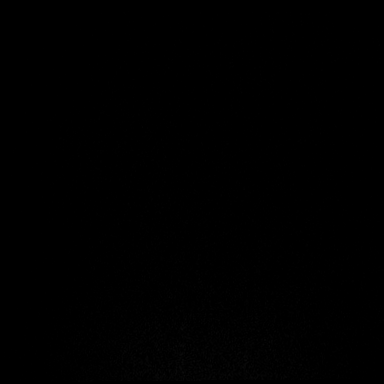

[40 of 40 positions shown; findings below may reference images not displayed]

FINDINGS: Bones/Joint/Cartilage

No acute fracture. No malalignment. No cortical thickening or
periosteal edema. No bone marrow edema. No significant arthropathy.
No joint effusion. No suspicious bone lesion.

Ligaments

Intact Lisfranc ligament. Collateral ligaments of the forefoot are
intact. No evidence of plantar plate disruption.

Muscles and Tendons

Intact flexor and extensor tendons without tendinosis, tear, or
tenosynovitis. Complete fatty atrophy of the abductor digiti minimi
muscle (series 4, image 46). The remaining intrinsic foot
musculature is normal in bulk and signal intensity.

Soft tissues

Nonspecific subcutaneous edema over the dorsum of the foot. No
organized fluid collection. No intermetatarsal space mass or fluid
collection. No soft tissue ulceration.
IMPRESSION: 1. No acute osseous abnormality of the right forefoot.
2. Complete fatty atrophy of the abductor digiti minimi muscle,
which can be seen in the setting of Baxter's neuropathy.
3. Nonspecific subcutaneous edema over the dorsum of the foot. No
organized fluid collection.

## 2021-10-09 ENCOUNTER — Ambulatory Visit: Payer: Medicaid Other | Admitting: Gastroenterology

## 2021-10-25 ENCOUNTER — Telehealth: Payer: Self-pay | Admitting: Internal Medicine

## 2021-10-25 NOTE — Telephone Encounter (Signed)
Spoke with Melanie at [facility]. Patient has refills on all meds except atarax. She is also requesting that refill on duloxetine be sent for the 60 mg caps so patient can take 1 cap daily.

## 2021-10-25 NOTE — Telephone Encounter (Signed)
Refill Request   DULoxetine (CYMBALTA) 30 MG capsule esomeprazole (NEXIUM) 40 MG capsule hydrOXYzine (ATARAX/VISTARIL) 10 MG tablet loratadine (CLARITIN) 10 MG tablet PROAIR HFA 108 (90 Base) MCG/ACT inhaler sertraline (ZOLOFT) 100 MG tablet   [id number] - [place], [place] - [address]

## 2021-11-12 ENCOUNTER — Ambulatory Visit: Payer: Medicaid Other | Admitting: Gastroenterology

## 2021-11-18 ENCOUNTER — Ambulatory Visit: Payer: Medicaid Other | Admitting: Student

## 2021-11-18 ENCOUNTER — Other Ambulatory Visit: Payer: Self-pay

## 2021-11-18 ENCOUNTER — Encounter: Payer: Self-pay | Admitting: Student

## 2021-11-18 VITALS — BP 128/70 | HR 81 | Temp 98.4°F | Ht 68.0 in | Wt >= 6400 oz

## 2021-11-18 DIAGNOSIS — F32A Depression, unspecified: Secondary | ICD-10-CM | POA: Diagnosis not present

## 2021-11-18 DIAGNOSIS — K580 Irritable bowel syndrome with diarrhea: Secondary | ICD-10-CM | POA: Diagnosis not present

## 2021-11-18 DIAGNOSIS — F339 Major depressive disorder, recurrent, unspecified: Secondary | ICD-10-CM | POA: Diagnosis not present

## 2021-11-18 DIAGNOSIS — S39012A Strain of muscle, fascia and tendon of lower back, initial encounter: Secondary | ICD-10-CM | POA: Insufficient documentation

## 2021-11-18 DIAGNOSIS — F419 Anxiety disorder, unspecified: Secondary | ICD-10-CM | POA: Diagnosis not present

## 2021-11-18 MED ORDER — IBUPROFEN 400 MG PO TABS: 400.0000 mg | ORAL_TABLET | Freq: Two times a day (BID) | ORAL | 0 refills | Status: DC | PRN

## 2021-11-18 MED ORDER — ACETAMINOPHEN 500 MG PO TABS: 1000.0000 mg | ORAL_TABLET | Freq: Three times a day (TID) | ORAL | 3 refills | Status: AC | PRN

## 2021-11-18 NOTE — Assessment & Plan Note (Addendum)
Patient seemed down during encounter today.  Patient reports that she is in an anti-depression and anti-anxiety medications but not sure which medications she takes.  She usually puts her medications together and take them.  She denies any suicidal ideations.  On exam, she is quiet and has a flat affect but answers questions appropriately. On chart review, patient is currently on Cymbalta 30 mg, Zoloft 100 mg daily and Atarax 10 mg as needed. Patient advised to call the clinic and report the names of her current medications before changes can be made.  Plan: -- Pending med reconciliation -- Referral back to Dr. Monna Fam for CBT -- Follow-up in 4 weeks, needs repeat PHQ-9 and GAD-7  Addendum: Called patient to clarify medications. States she is taking Cymbalta for his depression and hydroxyzine for her anxiety but has been out of his anxiety meds. Patient was previously on Zoloft 100 mg but states she is not currently taking it.  --Refill hydroxyzine 10 mg 1-2 mg prn for anxiety --Refill Cymbalta 60 mg daily --Can restart Zoloft og PHQ-9 has not improved at next OV.

## 2021-11-18 NOTE — Patient Instructions (Addendum)
Thank you, Kathryn Andrews for allowing [place] to provide your care today. Today we discussed your back pain, depression and anxiety.    For your back pain, this is likely a back strain from picking up your grocery.  We want you to start taking some Tylenol and alternating this with ibuprofen to help with the pain. We also referring you to physical therapy to help with back exercises.   Your anxiety and depression, I am referring you back to Dr. Monna Fam.  Please call us and let us know which medications you are currently taking for your anxiety and depression.  I will call if any are abnormal. All of your labs can be accessed through "My Chart".  I have place a referrals to our therapist, Dr. Monna Fam  I have ordered the following medication/changed the following medications:  Take Tylenol 1000 mg 3 times a day Take ibuprofen 800 mg twice daily as needed for pain  My Chart Access: [email or web address]  Please follow-up in 4 weeks  Please make sure to arrive 15 minutes prior to your next appointment. If you arrive late, you may be asked to reschedule.    We look forward to seeing you next time. Please call our clinic at [phone] if you have any questions or concerns. The best time to call is Monday-Friday from 9am-4pm, but there is someone available 24/7. If after hours or the weekend, call the main hospital number and ask for the Internal Medicine Resident On-Call. If you need medication refills, please notify your pharmacy one week in advance and they will send [place] a request.   Thank you for letting us take part in your care. Wishing you the best!  Steffanie Rainwater, MD 11/18/2021, 3:53 PM IM Resident, PGY-2 [place]ne Heck 41:10

## 2021-11-18 NOTE — Assessment & Plan Note (Signed)
Young morbidly obese patient with a history of chronic back pain presents for evaluation of low back pain that has not improved for the past 3 weeks. Patient reports that 3 weeks ago, she lifted a pack of water bottles while taking her groceries inside and started having low back pain. Pain is described as constant, and "hurts".  The pain is located in her lower back but does not radiate down her legs. She denies any associated numbness, tingling, shooting pains, weakness, urinary incontinence or bowel incontinence. She has tried some ice and decreased activity without significant improvement.  A/P Morbidly obese patient here for evaluation of 3-weeks of lower back pain likely secondary to muscle strain in the setting of patient lifting a heavy object. Patient's back pain is likely acute on chronic. No red flag symptoms.  Recommended weight loss to help improve her chronic back pain however patient not interested in any resources at the moment. We will continue symptomatic management and refer patient to PT. --Start Tylenol 1000 mg 3 times daily as needed for back pain -- Start ibuprofen 400 mg twice daily as needed for back pain -- Referral to PT -- Reference for back exercises given to patient -- Follow-up in 4 weeks for reevaluation.

## 2021-11-18 NOTE — Progress Notes (Signed)
° °  CC: Back pain  HPI:  [name] is a 26 y.o. female with PMH as below who presents to clinic for evaluation of low back pain. Please see problem based charting for evaluation, assessment and plan.  Past Medical History:  Diagnosis Date   Anxiety    Asthma    Attention deficit hyperactivity disorder    Chest pain    Chronic constipation    Depression    Morbid obesity (HCC) 03/02/2020    Review of Systems:  Constitutional: Negative for fever or fatigue MSK: Negative for neck pain. Positive for low back pain. GU: Negative for urinary incontinence Neuro: Negative for headache, numbness or tingling or weakness  Physical Exam: General: Pleasant, obese young female.  No acute distress. Cardiac: RRR. No murmurs, rubs or gallops. No LE edema Respiratory: Lungs CTAB. No wheezing or crackles. Skin: Warm, dry and intact without rashes or lesions MSK: Mild tenderness to palpation of the parasternal muscle surrounding L-spine. Mild bony tenderness of the L-spine. Neuro: A&O x 3. Moves all extremities. Sensation normal to gross touch.  Strength 5/5 lower extremities. Psych: Flat affect.  Vitals:   11/18/21 1440  BP: 128/70  Pulse: 81  Temp: 98.4 F (36.9 C)  TempSrc: Oral  SpO2: 99%  Weight: (!) 420 lb 8 oz (190.7 kg)  Height: 5\' 8"  (1.727 m)    Assessment & Plan:   See Encounters Tab for problem based charting.  Patient discussed with Dr. , MD, MPH

## 2021-11-18 NOTE — Assessment & Plan Note (Signed)
Patient's weight up to 420 pounds.  BMI is currently 63.9.  Patient counseled on importance of weight loss and improving her back pain and decreasing her chronic medical problems. Patient states she has not interested in any weight loss resources.  Plan: -- Continue to reemphasize weight loss and medicated by available resources at each office visit -- Address depression and anxiety

## 2021-11-19 NOTE — Progress Notes (Addendum)
Internal Medicine Clinic Attending  Case discussed with Dr. Kirke Corin  At the time of the visit.  We reviewed the residents history and exam and pertinent patient test results.  I agree with the assessment, diagnosis, and plan of care documented in the residents note.   Addendum 2/20: Reviewed resident addendum and current medication for anxiety and depression. Agree with Cymblata and hydroxyzine. Would not add additional SSRI to current regimen at f/u visit. If PHQ-9 has not improved, would discuss alternative regimen.

## 2021-11-22 MED ORDER — HYDROXYZINE HCL 10 MG PO TABS: ORAL_TABLET | ORAL | 3 refills | Status: DC

## 2021-11-22 MED ORDER — DULOXETINE HCL 30 MG PO CPEP: 60.0000 mg | ORAL_CAPSULE | Freq: Every day | ORAL | 3 refills | Status: DC

## 2021-11-22 NOTE — Addendum Note (Signed)
Addended byLinwood Dibbles on: 11/22/2021 05:16 PM   Modules accepted: Orders

## 2021-11-28 ENCOUNTER — Encounter: Payer: Self-pay | Admitting: Internal Medicine

## 2021-12-09 ENCOUNTER — Other Ambulatory Visit: Payer: Self-pay

## 2021-12-09 ENCOUNTER — Ambulatory Visit: Payer: Medicaid Other | Attending: Internal Medicine

## 2021-12-09 DIAGNOSIS — M6281 Muscle weakness (generalized): Secondary | ICD-10-CM | POA: Diagnosis present

## 2021-12-09 DIAGNOSIS — R2689 Other abnormalities of gait and mobility: Secondary | ICD-10-CM | POA: Diagnosis present

## 2021-12-09 DIAGNOSIS — S39012A Strain of muscle, fascia and tendon of lower back, initial encounter: Secondary | ICD-10-CM | POA: Insufficient documentation

## 2021-12-09 DIAGNOSIS — G8929 Other chronic pain: Secondary | ICD-10-CM | POA: Diagnosis present

## 2021-12-09 DIAGNOSIS — M545 Low back pain, unspecified: Secondary | ICD-10-CM | POA: Insufficient documentation

## 2021-12-09 NOTE — Therapy (Signed)
OUTPATIENT PHYSICAL THERAPY THORACOLUMBAR EVALUATION   Patient Name: Kathryn Andrews MRN: [id number] DOB:Mar 31, 1996, 26 y.o., female Today's Date: 12/09/2021   PT End of Session - 12/09/21 1432     Visit Number 1    Number of Visits 17    Date for PT Re-Evaluation 02/03/22    PT Start Time 1432    PT Stop Time 1510    PT Time Calculation (min) 38 min    Activity Tolerance Patient tolerated treatment well    Behavior During Therapy [facility] for tasks assessed/performed             Past Medical History:  Diagnosis Date   Anxiety    Asthma    Attention deficit hyperactivity disorder    Chest pain    Chronic constipation    Depression    Morbid obesity (HCC) 03/02/2020   Past Surgical History:  Procedure Laterality Date   adenoid removal     BIOPSY  08/12/2021   Procedure: BIOPSY;  Surgeon: Lemar Lofty., MD;  Location: [facility];  Service: Gastroenterology;;  EGD and COLON   COLONOSCOPY WITH PROPOFOL N/A 08/12/2021   Procedure: COLONOSCOPY WITH PROPOFOL;  Surgeon: Lemar Lofty., MD;  Location: [facility];  Service: Gastroenterology;  Laterality: N/A;   ESOPHAGOGASTRODUODENOSCOPY (EGD) WITH PROPOFOL N/A 08/12/2021   Procedure: ESOPHAGOGASTRODUODENOSCOPY (EGD) WITH PROPOFOL;  Surgeon: Meridee Score Netty Starring., MD;  Location: [facility];  Service: Gastroenterology;  Laterality: N/A;   EXTERNAL EAR SURGERY     POLYPECTOMY  08/12/2021   Procedure: POLYPECTOMY;  Surgeon: Mansouraty, Netty Starring., MD;  Location: [facility];  Service: Gastroenterology;;   WISDOM TOOTH EXTRACTION     Patient Active Problem List   Diagnosis Date Noted   Low back strain 11/18/2021   Folliculitis 04/05/2021   Chronic diarrhea 03/08/2021   Abdominal pain, epigastric 03/08/2021   Anxiety and depression 04/02/2020   Chronic lower back pain 04/02/2020   Urinary frequency 04/02/2020   Healthcare maintenance 03/02/2020   Morbid obesity (HCC) 03/02/2020   Allergic rhinitis  03/02/2020   Functional dyspepsia 02/28/2020   Chronic constipation     PCP: Gwenevere Abbot, MD  REFERRING PROVIDER: Gust Rung, DO  REFERRING DIAG: [phone] (ICD-10-CM) - Back strain, initial encounter  THERAPY DIAG:  Chronic right-sided low back pain, unspecified whether sciatica present - Plan: PT plan of care cert/re-cert  Muscle weakness (generalized) - Plan: PT plan of care cert/re-cert  Other abnormalities of gait and mobility - Plan: PT plan of care cert/re-cert  ONSET DATE: Chronic  SUBJECTIVE:  SUBJECTIVE STATEMENT: Pt presents to PT with reports of chronic LBP. Recently she was lifting groceries off the ground and had a strain in her lower back. In the past she has had paresthesia down LE, denies this currently. Pain now is mainly located in lower back in right side of lower back, no referral past her lower back. Pt denies bowel/bladder changes or saddle anesthesia.   PERTINENT HISTORY:  Anxiety and depression  PAIN:  Are you having pain? Yes NPRS scale: 5/10 Pain location: lower back PAIN TYPE: aching and sharp Pain description: intermittent  Aggravating factors: ice/heat; crouching Relieving factors: standing >5 min, fwd bending, walking  PRECAUTIONS: None  WEIGHT BEARING RESTRICTIONS: No  FALLS:  Has patient fallen in last 6 months? No, Number of falls: N/A  LIVING ENVIRONMENT: Lives with: lives with their family Lives in: House/apartment Stairs: Yes; External: 5 steps; bilateral but cannot reach both Has following equipment at home: None  OCCUPATION: Not currently working  PLOF: Independent and Independent with basic ADLs  PATIENT GOALS: decrease back pain in order to improve functional ability and QoL   OBJECTIVE:   DIAGNOSTIC FINDINGS:  N/A  PATIENT  SURVEYS:  Modified Oswestry 50% disability   COGNITION:  Overall cognitive status: Within functional limits for tasks assessed     SENSATION:  Light touch: Appears intact  MUSCLE LENGTH: Hamstrings: Right WFL deg; Left WFL deg  POSTURE:  Large body habitus; increased lumbar lordosis  PALPATION: TTP to R sided lumbar paraspinals  LUMBARAROM/PROM  A/PROM A/PROM  12/09/2021  Flexion Increased pain with repeated flexion  Extension Extension reduced; pain with repated motions   (Blank rows = not tested)  LE MMT:  MMT Right 12/09/2021 Left 12/09/2021  Hip flexion  5/5 5/5  Hip extension    Hip abduction 4/5 4/5  Hip adduction 3+/5 3+/5  Hip external rotation    Hip internal rotation    Knee extension 5/5 5/5  Knee flexion 5/5 5/5  Ankle dorsiflexion     Ankle plantarflexion    Ankle inversion    Ankle eversion    Grossly    (Blank rows = not tested)  LUMBAR SPECIAL TESTS:  Straight leg raise test: Positive Left  FUNCTIONAL TESTS:  30 seconds chair stand test: 7 reps Increased lumbar/trunk flexion when lifting 3# weight from floor  GAIT: Distance walked: 18ft Assistive device utilized: None Level of assistance: Complete Independence Comments: decreased gait speed  TODAY'S TREATMENT  [facility] Adult PT Treatment: DATE: 12/09/2021 Therapeutic Exercise: Supine PPT x 5 Supine SLR x 5 each Seated clamshell x 20 GTB Seated ball squeeze x 10 - 5" hold  PATIENT EDUCATION:  Education details: eval findings, ODI, HEP, POC Person educated: Patient Education method: Explanation, Demonstration, and Handouts Education comprehension: verbalized understanding and returned demonstration   HOME EXERCISE PROGRAM: Access Code: [email or web address] URL: [email or web address] Date: 12/09/2021 Prepared by: Edwinna Areola  Exercises Supine Posterior Pelvic Tilt - 1 x daily - 7 x weekly - 2 sets - 10 reps - 5 sec hold Supine Active Straight Leg Raise - 1 x daily - 7 x weekly - 2  sets - 10 reps Seated Hip Abduction with Resistance - 1 x daily - 7 x weekly - 3 sets - 20 reps - green tband hold Seated Hip Adduction Squeeze with Ball - 1 x daily - 7 x weekly - 3 sets - 10 reps - 5 sec hold   ASSESSMENT:  CLINICAL IMPRESSION: Patient is a 26 y.o. F who was seen  today for physical therapy evaluation and treatment for chronic LBP. Physical findings are consistent with physician impression, as pt demonstrates functional hip and core weakness as well as palpable pain and positive SLR test provocative for LBP and adverse neural tension. Her 30 Second Sit to Stand rep count demonstrates decreased strength and reduced functional mobility. Likewise, her ODI score indicates severe disability in the performance of home ADLs and shows she is operating below PLOF. Pt would benefit from skilled PT services working on improving core and hip strength in order to decrease pain and improve functional ability.     OBJECTIVE IMPAIRMENTS decreased activity tolerance, decreased endurance, decreased ROM, decreased strength, improper body mechanics, and pain.   ACTIVITY LIMITATIONS cleaning, community activity, driving, and yard work.   PERSONAL FACTORS Fitness, Time since onset of injury/illness/exacerbation, and 1-2 comorbidities: anxiety and depression  are also affecting patient's functional outcome.    REHAB POTENTIAL: Good  CLINICAL DECISION MAKING: Evolving/moderate complexity  EVALUATION COMPLEXITY: Moderate   GOALS: Goals reviewed with patient? Yes  SHORT TERM GOALS:  Pt will be compliant and knowledgeable with initial HEP for improved comfort and carryover Baseline: initial HEP given Target date: 12/30/2021 Goal status: INITIAL  2.  Pt will self report low back pain no greater than 6/10 for improved comfort and functional ability Baseline: 10/10 at worst Target date: 12/30/2021 Goal status: INITIAL  LONG TERM GOALS:  Pt will self report low back pain no greater than  3/10 for improved comfort and functional ability Baseline: 10/10 at worst Target date: 02/03/2022 Goal status: INITIAL  2.  Pt will decrease ODI score to no greater than 38% disability as proxy for functional improvement Baseline: 50% disability Target date: 02/03/2022 Goal status: INITIAL  3.  Pt will be able to lift 50lb via deadlift from with no increase in LBP for improved functional ability with ADL performance.  Baseline: unable Target date: 02/03/2022 Goal status: INITIAL  4.  Pt will increase 30 Second Sit to Stand rep count to no less than 10 reps for improved balance, strength, and functional mobility Baseline: 7 Target date: 02/03/2022 Goal status: INITIAL  PLAN: PT FREQUENCY: 1-2x/week  PT DURATION: 8 weeks  PLANNED INTERVENTIONS: Therapeutic exercises, Therapeutic activity, Neuromuscular re-education, Balance training, Gait training, Patient/Family education, Joint mobilization, Aquatic Therapy, Dry Needling, Cryotherapy, Moist heat, and Manual therapy  PLAN FOR NEXT SESSION: assess HEP response; proximal hip and core strengthening   Eloy End, PT 12/09/2021, 3:31 PM

## 2021-12-10 ENCOUNTER — Ambulatory Visit (INDEPENDENT_AMBULATORY_CARE_PROVIDER_SITE_OTHER): Payer: Medicaid Other | Admitting: Gastroenterology

## 2021-12-10 ENCOUNTER — Encounter: Payer: Self-pay | Admitting: Gastroenterology

## 2021-12-10 VITALS — BP 134/92 | HR 84 | Ht 68.0 in | Wt >= 6400 oz

## 2021-12-10 DIAGNOSIS — K529 Noninfective gastroenteritis and colitis, unspecified: Secondary | ICD-10-CM

## 2021-12-10 DIAGNOSIS — E669 Obesity, unspecified: Secondary | ICD-10-CM

## 2021-12-10 DIAGNOSIS — Z6841 Body Mass Index (BMI) 40.0 and over, adult: Secondary | ICD-10-CM

## 2021-12-10 DIAGNOSIS — K580 Irritable bowel syndrome with diarrhea: Secondary | ICD-10-CM | POA: Diagnosis not present

## 2021-12-10 DIAGNOSIS — R1084 Generalized abdominal pain: Secondary | ICD-10-CM

## 2021-12-10 MED ORDER — RIFAXIMIN 550 MG PO TABS: 550.0000 mg | ORAL_TABLET | Freq: Three times a day (TID) | ORAL | 0 refills | Status: DC

## 2021-12-10 MED ORDER — ESOMEPRAZOLE MAGNESIUM 40 MG PO CPDR
40.0000 mg | DELAYED_RELEASE_CAPSULE | Freq: Every day | ORAL | 6 refills | Status: DC
Start: 2021-12-10 — End: 2022-03-26

## 2021-12-10 NOTE — Progress Notes (Signed)
GASTROENTEROLOGY OUTPATIENT CLINIC VISIT   Primary Care Provider Gwenevere Abbot, MD [address] [place] [address] [phone]  Patient Profile: Kathryn Andrews is a 26 y.o. female with a pmh significant for morbid obesity, asthma, MDD/anxiety, ADHD, ?IBS.  The patient presents to the [facility] for an evaluation and management of problem(s) noted below:  Problem List 1. Chronic diarrhea   2. Irritable bowel syndrome with diarrhea   3. Generalized abdominal pain   4. Class 3 severe obesity without serious comorbidity with body mass index (BMI) of 60.0 to 69.9 in adult, unspecified obesity type (HCC)     History of Present Illness Please see prior notes for full details of HPI.  Interval History The patient returns for follow-up.  She states that overall she is doing slightly better than when she had last seen [place] and before her colonoscopy.  Since her colonoscopy things have been going okay.  She still has diarrheal movements anywhere between 2 and 6 times per day.  This is not keeping her from doing her activities of daily living.  They are bothersome however.  She denies any blood in her stools.  GI Review of Systems Positive as above Negative for odynophagia, dysphagia, early satiety, melena, hematochezia  Review of Systems General: Denies fevers/chills/weight loss unintentionally Cardiovascular: Denies chest pain/palpitations Pulmonary: Denies shortness of breath Gastroenterological: See HPI Genitourinary: Denies darkened urine Hematological: Denies easy bruising/bleeding Dermatological: Denies jaundice Psychological: Mood is stable   Medications Current Outpatient Medications  Medication Sig Dispense Refill   DULoxetine (CYMBALTA) 30 MG capsule Take 2 capsules (60 mg total) by mouth daily. 60 capsule 3   hydrOXYzine (ATARAX) 10 MG tablet Take 1-2 tabs as needed for anxious feeling, up to three times a day This can cause sleepiness and  dry mouth 30 tablet 3   ibuprofen (ADVIL) 400 MG tablet Take 1 tablet (400 mg total) by mouth 2 (two) times daily as needed for moderate pain. 30 tablet 0   loratadine (CLARITIN) 10 MG tablet TAKE 1 TABLET BY MOUTH EVERY DAY 90 tablet 0   PROAIR HFA 108 (90 Base) MCG/ACT inhaler TAKE 2 PUFFS BY MOUTH EVERY 6 HOURS AS NEEDED FOR WHEEZE OR SHORTNESS OF BREATH 8.5 each 2   rifaximin (XIFAXAN) 550 MG TABS tablet Take 1 tablet (550 mg total) by mouth 3 (three) times daily. 42 tablet 0   acetaminophen (TYLENOL) 500 MG tablet Take 2 tablets (1,000 mg total) by mouth 3 (three) times daily as needed for mild pain. 90 tablet 3   esomeprazole (NEXIUM) 40 MG capsule Take 1 capsule (40 mg total) by mouth daily before breakfast. 30 capsule 6   No current facility-administered medications for this visit.    Allergies No Known Allergies  Histories Past Medical History:  Diagnosis Date   Anxiety    Asthma    Attention deficit hyperactivity disorder    Chest pain    Chronic constipation    Depression    Morbid obesity (HCC) 03/02/2020   Past Surgical History:  Procedure Laterality Date   adenoid removal     BIOPSY  08/12/2021   Procedure: BIOPSY;  Surgeon: Lemar Lofty., MD;  Location: [facility];  Service: Gastroenterology;;  EGD and COLON   COLONOSCOPY WITH PROPOFOL N/A 08/12/2021   Procedure: COLONOSCOPY WITH PROPOFOL;  Surgeon: Lemar Lofty., MD;  Location: [facility];  Service: Gastroenterology;  Laterality: N/A;   ESOPHAGOGASTRODUODENOSCOPY (EGD) WITH PROPOFOL N/A 08/12/2021   Procedure: ESOPHAGOGASTRODUODENOSCOPY (EGD) WITH  PROPOFOL;  Surgeon: Lemar Lofty., MD;  Location: [facility];  Service: Gastroenterology;  Laterality: N/A;   EXTERNAL EAR SURGERY     POLYPECTOMY  08/12/2021   Procedure: POLYPECTOMY;  Surgeon: Mansouraty, Netty Starring., MD;  Location: [facility];  Service: Gastroenterology;;   WISDOM TOOTH EXTRACTION     Social History   Socioeconomic  History   Marital status: Single    Spouse name: Not on file   Number of children: Not on file   Years of education: Not on file   Highest education level: Not on file  Occupational History   Not on file  Tobacco Use   Smoking status: Every Day    Types: E-cigarettes    Start date: 02/28/2019   Smokeless tobacco: Never   Tobacco comments:    Vapes   Substance and Sexual Activity   Alcohol use: Yes    Comment: occasionally   Drug use: Yes    Types: Marijuana   Sexual activity: Not on file  Other Topics Concern   Not on file  Social History Narrative   Not on file   Social Determinants of Health   Financial Resource Strain: Not on file  Food Insecurity: Not on file  Transportation Needs: Not on file  Physical Activity: Not on file  Stress: Not on file  Social Connections: Not on file  Intimate Partner Violence: Not on file   Family History  Problem Relation Age of Onset   Diabetes Paternal Grandmother    CAD Paternal Grandmother    Hypertension Paternal Grandmother    Hyperlipidemia Paternal Grandmother    Colon cancer Neg Hx    Esophageal cancer Neg Hx    Inflammatory bowel disease Neg Hx    Liver disease Neg Hx    Pancreatic cancer Neg Hx    Rectal cancer Neg Hx    Stomach cancer Neg Hx    I have reviewed her medical, social, and family history in detail and updated the electronic medical record as necessary.    PHYSICAL EXAMINATION  BP (!) 134/92 (BP Location: Right Arm, Patient Position: Sitting, Cuff Size: Normal)    Pulse 84    Ht 5\' 8"  (1.727 m)    Wt (!) 407 lb 2 oz (184.7 kg)    SpO2 97%    BMI 61.90 kg/m  Wt Readings from Last 3 Encounters:  12/10/21 (!) 407 lb 2 oz (184.7 kg)  11/18/21 (!) 420 lb 8 oz (190.7 kg)  08/12/21 (!) 430 lb (195 kg)  GEN: NAD, appears stated age, doesn't appear chronically ill PSYCH: Cooperative, without pressured speech EYE: Conjunctivae pink, sclerae anicteric ENT: MMM CV: Nontachycardic RESP: No audible  wheezing GI: Obese, rounded, no rebound or guarding, unable to appreciate hepatosplenomegaly due to body habitus  MSK/EXT: Slight lower extremity edema present SKIN: No jaundice NEURO:  Alert & Oriented x 3, no focal deficits   REVIEW OF DATA  I reviewed the following data at the time of this encounter:  GI Procedures and Studies  November 2022 EGD - No gross lesions in esophagus. Z-line irregular, 39 cm from the incisors. - 1 cm hiatal hernia. - Erythematous mucosa in the antrum. No other gross lesions in the stomach. Biopsied. - No gross lesions in the duodenal bulb, in the first portion of the duodenum and in the second portion of the duodenum. Biopsied.  November 2022 colonoscopy - Hemorrhoids found on digital rectal exam. - The examined portion of the ileum was normal. Biopsied. -  One 4 mm polyp in the rectum. - Normal mucosa in the entire examined colon otherwise. Biopsied. - Non-bleeding non-thrombosed external and internal hemorrhoids.  Pathology FINAL MICROSCOPIC DIAGNOSIS:  A. DUODENUM, BIOPSY:  - Duodenal mucosa with no specific histopathologic changes  - Negative for increased intraepithelial lymphocytes or villous  architectural changes  B. STOMACH, BIOPSY:  - Gastric antral and oxyntic mucosa with mild chronic gastritis  - Warthin Starry stain is negative for Helicobacter pylori  C. SMALL BOWEL, BIOPSY:  - Small intestinal mucosa with no specific histopathologic changes  - Negative for increased intraepithelial lymphocytes or villous  architectural changes  D. COLON, RANDOM, BIOPSY:  - Colonic mucosa with reactive changes, mild edema and rare neutrophils in the lamina propria. See comment  - Negative for increased intraepithelial lymphocytes or thickened  subepithelial collagen table  E. RECTUM, POLYPECTOMY:  - Hyperplastic polyp  Laboratory Studies  Reviewed those in epic  Imaging Studies  No relevant studies to review   ASSESSMENT  Ms. Fana is a  26 y.o. female with a pmh significant for morbid obesity, asthma, MDD/anxiety, ADHD, ?IBS.  The patient is seen today for evaluation and management of:  1. Chronic diarrhea   2. Irritable bowel syndrome with diarrhea   3. Generalized abdominal pain   4. Class 3 severe obesity without serious comorbidity with body mass index (BMI) of 60.0 to 69.9 in adult, unspecified obesity type [facility])    The patient remains hemodynamically stable.  Clinically, she continues to have diarrheal symptoms.  I suspect she has a functional diarrhea and irritable bowel syndrome-D.  We did discuss consideration of SIBO breath testing in the future if she continues to have issues.  We are going to go ahead and move forward with IBS-D treatment with Xifaxan and effort of seeing if that will make a difference for her symptoms.  If it does not then SIBO breath testing as discussed is a reasonable next step.  Can consider bile salt diarrhea as well as other disorders in the future.  All patient questions were answered to the best of my ability, and the patient agrees to the aforementioned plan of action with follow-up as indicated.   PLAN  Xifaxan 550 mg 3 times daily x14 days for IBS-D treatment Consider SIBO breath testing in future We will consider Lomotil use in future Continue PPI once daily   Orders Placed This Encounter  Procedures   Ambulatory referral to General Surgery    New Prescriptions   RIFAXIMIN (XIFAXAN) 550 MG TABS TABLET    Take 1 tablet (550 mg total) by mouth 3 (three) times daily.   Modified Medications   Modified Medication Previous Medication   ESOMEPRAZOLE (NEXIUM) 40 MG CAPSULE esomeprazole (NEXIUM) 40 MG capsule      Take 1 capsule (40 mg total) by mouth daily before breakfast.    Take 1 capsule (40 mg total) by mouth daily before breakfast.    Planned Follow Up No follow-ups on file.   Total Time in Face-to-Face and in Coordination of Care for patient including independent/personal  interpretation/review of prior testing, medical history, examination, medication adjustment, communicating results with the patient directly, and documentation with the EHR is 25 minutes.   Corliss Parish, MD [facility] Office # [phone]

## 2021-12-10 NOTE — Patient Instructions (Addendum)
You have been referred to [facility]. Make certain to bring a list of current medications, including any over the counter medications or vitamins. Also bring your co-pay if you have one as well as your insurance cards. [facility] is located at [address], [address]. If you have not heard from their office in 1-2  weeks, please contact them at [phone]. ? ?We have sent the following medications to your pharmacy for you to pick up at your convenience: ?Xifaxan, Nexium  ? ?If Xifaxan does not help, Dr Meridee Score will consider given Cholestyramine as a trial.  ? ?Please keep follow-up on : 01/28/22 at 11:10am  ? ?Thank you for choosing me and [facility]. ? ?Dr. Meridee Score ?  ? ?

## 2021-12-11 ENCOUNTER — Ambulatory Visit: Payer: Medicaid Other | Admitting: Behavioral Health

## 2021-12-11 DIAGNOSIS — F341 Dysthymic disorder: Secondary | ICD-10-CM

## 2021-12-11 NOTE — BH Specialist Note (Signed)
Integrated Behavioral Health via Telemedicine Visit ? ?12/11/2021 ?Kathryn Andrews ?[id number] ? ?Number of Integrated Behavioral Health Clinician visits: 1st visit since 04/26/21 ?Session Start time: 1100 am ?Session End time: 1145 am ?Total time in minutes: 45 min ? ?Referring Provider: Dr Demetrius Charity. Kirke Corin, MD ?Patient/Family location: Pt is home in private ?[facility] Provider location: [facility] Office ?All persons participating in visit: Pt & Clinician ?Types of Service: Individual psychotherapy ? ?I connected with [name] and/or Pietro Cassis Zurn's  self  via  Telephone or Engineer, civil (consulting)  (Video is Caregility application) and verified that I am speaking with the correct person using two identifiers. Discussed confidentiality: Yes  ? ?I discussed the limitations of telemedicine and the availability of in person appointments.  Discussed there is a possibility of technology failure and discussed alternative modes of communication if that failure occurs. ? ?I discussed that engaging in this telemedicine visit, they consent to the provision of behavioral healthcare and the services will be billed under their insurance. ? ?Patient and/or legal guardian expressed understanding and consented to Telemedicine visit: Yes  ? ?Presenting Concerns: ?Patient and/or family reports the following symptoms/concerns: elevated Sx of dep which present as flat affect/apathy/indifference; Pt also perked up once conversation & relationship was re-estb'd ?Duration of problem: years; Severity of problem: moderate w/trending severity in weight gain. Pt is not moving much, but trying to use stationary bike daily & exercise mvmts on her Virtual reality headset ? ?Pt does not have optimum diet, eating one meal/day on ave. Pt lives w/her GM who is on a fixed budget. The go to Grocery once every 3 wks on ave. Pt cooks & so does GM. ? ?Patient and/or Family's Strengths/Protective Factors: ?Social connections, environment w/GM is safe, &  Pt does get out to socialize on a limited basis w/friends & Family ? ?Goals Addressed: ?Patient will: ? Reduce symptoms of: anxiety, depression, and mood instability  ? Increase knowledge and/or ability of: coping skills, healthy habits, and self-management skills, the pattern of Pt being shut-in & not working is not healthy for her. ? Demonstrate ability to: Increase adequate support systems for patient/family and inc Pt motivation to move more & be sedentary less, eat well, & find a job she can accomplish-even remote work ? ?Progress towards Goals: ?Revised- Pt has inadequate resources & needs employment to motivate her further. ? ?Interventions: ?Interventions utilized:  Solution-Focused Strategies, Behavioral Activation, and Supportive Counseling ?Standardized Assessments completed:  screeners prn ? ?Patient and/or Family Response: Pt responsive to call today, but sleeping when Clinician called. Pt tech was sketchy @ first until she began charging her phone when reception was inc'd. ? ?Assessment: ?Patient currently experiencing Sx of anx/dep & apathy. Pt does not sound empowered to take charge of her circumstances & improve her situation. Today, she did get more energized towards the end of the session. ? ?Patient may benefit from cont'd compliance w/all scheduled sessions. ? ?Plan: ?Follow up with behavioral health clinician on : 2-3 wks for 60 min telehealth ?Behavioral recommendations: Move more to your comfort level & Physician's cautions. Be adherent to your mental health medications. Cont to use your stationary bike & the exercises on your Virtual Headset. ?Referral(s): Integrated Behavioral Health Services (In Clinic) and recommendation to f/u with [facility] ? ?I discussed the assessment and treatment plan with the patient and/or parent/guardian. They were provided an opportunity to ask questions and all were answered. They agreed with the plan and demonstrated an  understanding of the instructions. ?  ?They were advised to call back or seek an in-person evaluation if the symptoms worsen or if the condition fails to improve as anticipated. ? ?Deneise Lever, LMFT ?

## 2021-12-15 DIAGNOSIS — K580 Irritable bowel syndrome with diarrhea: Secondary | ICD-10-CM | POA: Insufficient documentation

## 2021-12-15 DIAGNOSIS — Z6841 Body Mass Index (BMI) 40.0 and over, adult: Secondary | ICD-10-CM

## 2021-12-15 DIAGNOSIS — R1084 Generalized abdominal pain: Secondary | ICD-10-CM

## 2021-12-15 HISTORY — DX: Morbid (severe) obesity due to excess calories: E66.01

## 2021-12-15 HISTORY — DX: Generalized abdominal pain: R10.84

## 2021-12-15 HISTORY — DX: Body Mass Index (BMI) 40.0 and over, adult: Z684

## 2021-12-15 HISTORY — DX: Irritable bowel syndrome with diarrhea: K58.0

## 2021-12-17 ENCOUNTER — Other Ambulatory Visit: Payer: Self-pay

## 2021-12-17 ENCOUNTER — Ambulatory Visit: Payer: Medicaid Other

## 2021-12-17 DIAGNOSIS — M545 Low back pain, unspecified: Secondary | ICD-10-CM | POA: Diagnosis not present

## 2021-12-17 DIAGNOSIS — R2689 Other abnormalities of gait and mobility: Secondary | ICD-10-CM

## 2021-12-17 DIAGNOSIS — M6281 Muscle weakness (generalized): Secondary | ICD-10-CM

## 2021-12-17 NOTE — Therapy (Signed)
?OUTPATIENT PHYSICAL THERAPY TREATMENT NOTE ? ? ?Patient Name: Kathryn LarsenDavina Andrews ?MRN: [id number] ?DOB:May 17, 1996, 26 y.o., female ?Today's Date: 12/17/2021 ? ?PCP: Gwenevere AbbotKhan, Ghalib, MD ?REFERRING PROVIDER: Gwenevere AbbotKhan, Ghalib, MD ? ? PT End of Session - 12/17/21 1515   ? ? Visit Number 2   ? Number of Visits 17   ? Date for PT Re-Evaluation 02/03/22   ? PT Start Time 1515   ? PT Stop Time 1555   ? PT Time Calculation (min) 40 min   ? Activity Tolerance Patient tolerated treatment well   ? Behavior [facility] for tasks assessed/performed   ? ?  ?  ? ?  ? ? ?Past Medical History:  ?Diagnosis Date  ? Anxiety   ? Asthma   ? Attention deficit hyperactivity disorder   ? Chest pain   ? Chronic constipation   ? Depression   ? Morbid obesity (HCC) 03/02/2020  ? ?Past Surgical History:  ?Procedure Laterality Date  ? adenoid removal    ? BIOPSY  08/12/2021  ? Procedure: BIOPSY;  Surgeon: Lemar LoftyMansouraty, Gabriel Jr., MD;  [facility];  Service: Gastroenterology;;  EGD and COLON  ? COLONOSCOPY WITH PROPOFOL N/A 08/12/2021  ? Procedure: COLONOSCOPY WITH PROPOFOL;  Surgeon: Mansouraty, Netty StarringGabriel Jr., MD;  [facility];  Service: Gastroenterology;  Laterality: N/A;  ? ESOPHAGOGASTRODUODENOSCOPY (EGD) WITH PROPOFOL N/A 08/12/2021  ? Procedure: ESOPHAGOGASTRODUODENOSCOPY (EGD) WITH PROPOFOL;  Surgeon: Meridee ScoreMansouraty, Netty StarringGabriel Jr., MD;  [facility];  Service: Gastroenterology;  Laterality: N/A;  ? EXTERNAL EAR SURGERY    ? POLYPECTOMY  08/12/2021  ? Procedure: POLYPECTOMY;  Surgeon: Mansouraty, Netty StarringGabriel Jr., MD;  [facility];  Service: Gastroenterology;;  ? WISDOM TOOTH EXTRACTION    ? ?Patient Active Problem List  ? Diagnosis Date Noted  ? Irritable bowel syndrome with diarrhea 12/15/2021  ? Generalized abdominal pain 12/15/2021  ? Class 3 severe obesity without serious comorbidity with body mass index (BMI) of 60.0 to 69.9 in adult [facility](HCC) 12/15/2021  ? Low back strain 11/18/2021  ? Folliculitis 04/05/2021  ?  Chronic diarrhea 03/08/2021  ? Abdominal pain, epigastric 03/08/2021  ? Anxiety and depression 04/02/2020  ? Chronic lower back pain 04/02/2020  ? Urinary frequency 04/02/2020  ? Healthcare maintenance 03/02/2020  ? Morbid obesity (HCC) 03/02/2020  ? Allergic rhinitis 03/02/2020  ? Functional dyspepsia 02/28/2020  ? Chronic constipation   ? ? ?REFERRING DIAG:  ?S39.012A (ICD-10-CM) - Back strain, initial encounter ? ?THERAPY DIAG:  ?Chronic right-sided low back pain, unspecified whether sciatica present ? ?Muscle weakness (generalized) ? ?Other abnormalities of gait and mobility ? ?PERTINENT HISTORY:  ?Anxiety and depression ? ?PRECAUTIONS:  ?None ? ?SUBJECTIVE:  ?Pt presents to PT with  ? ?Pain: ?Are you having pain? No ?NPRS: 0/10 ?Pain location: lower back ?PAIN TYPE: aching and sharp ?Pain description: intermittent  ?Aggravating factors: ice/heat; crouching ?Relieving factors: standing >5 min, fwd bending, walking ? ? ?OBJECTIVE:  ?  ?DIAGNOSTIC FINDINGS:  ?N/A ?  ?PATIENT SURVEYS:  ?Modified Oswestry 50% disability  ?  ?COGNITION: ?         Overall cognitive status: Within functional limits for tasks assessed              ?          ?SENSATION: ?         Light touch: Appears intact ?  ?MUSCLE LENGTH: ?Hamstrings: Right WFL deg; Left WFL deg ?  ?POSTURE:  ?Large body habitus; increased lumbar lordosis ?  ?PALPATION: ?TTP  to R sided lumbar paraspinals ?  ?LUMBARAROM/PROM ?  ?A/PROM A/PROM  ?12/09/2021  ?Flexion Increased pain with repeated flexion  ?Extension Extension reduced; pain with repated motions  ? (Blank rows = not tested) ?  ?LE MMT: ?  ?MMT Right ?12/09/2021 Left ?12/09/2021  ?Hip flexion  5/5 5/5  ?Hip extension      ?Hip abduction 4/5 4/5  ?Hip adduction 3+/5 3+/5  ?Hip external rotation      ?Hip internal rotation      ?Knee extension 5/5 5/5  ?Knee flexion 5/5 5/5  ?Ankle dorsiflexion       ?Ankle plantarflexion      ?Ankle inversion      ?Ankle eversion      ?Grossly      ?(Blank rows = not tested) ?   ?LUMBAR SPECIAL TESTS:  ?Straight leg raise test: Positive Left ?  ?FUNCTIONAL TESTS:  ?30 seconds chair stand test: 7 reps ?Increased lumbar/trunk flexion when lifting 3# weight from floor ?  ?GAIT: ?Distance walked: 71ft ?Assistive device utilized: None ?Level of assistance: Complete Independence ?Comments: decreased gait speed ?  ?TODAY'S TREATMENT  ?[facility] Adult PT Treatment: DATE: 12/17/2021 ?Therapeutic Exercise: ?LTR x 10 ?Supine SLR 2x10 each ?Supine clamshell 2x15 GTB ?Supine march 2x20 GTB ?Supine PPT x 10 - 5" hold ?Supine PPT with ball 2x10 - 5" hold ?Seated physioball rollout fwd/lat x 10 each ?STS from elevated table 2x10 - no UE support ? ?[facility] Adult PT Treatment: DATE: 12/09/2021 ?Therapeutic Exercise: ?Supine PPT x 5 ?Supine SLR x 5 each ?Seated clamshell x 20 GTB ?Seated ball squeeze x 10 - 5" hold ?  ?PATIENT EDUCATION:  ?Education details: eval findings, ODI, HEP, POC ?Person educated: Patient ?Education method: Explanation, Demonstration, and Handouts ?Education comprehension: verbalized understanding and returned demonstration ?  ?  ?HOME EXERCISE PROGRAM: ?Access Code: [email or web address] ?URL: [email or web address] ?Date: 12/17/2021 ?Prepared by: Edwinna Areola ? ?Exercises ?Supine Posterior Pelvic Tilt - 1 x daily - 7 x weekly - 2 sets - 10 reps - 5 sec hold ?Supine Active Straight Leg Raise - 1 x daily - 7 x weekly - 2 sets - 10 reps ?Seated Hip Abduction with Resistance - 1 x daily - 7 x weekly - 3 sets - 20 reps - green tband hold ?Seated Hip Adduction Squeeze with Ball - 1 x daily - 7 x weekly - 3 sets - 10 reps - 5 sec hold ?Sit to Stand - 1 x daily - 7 x weekly - 2 sets - 10 reps ? ?  ?ASSESSMENT: ?  ?CLINICAL IMPRESSION: ?Pt was able to complete all prescribed exercises with no adverse effect. Therapy today focused on improving core and proximal hip strength in order to decrease pain and improve mobility. She continues to benefit from skilled PT services and will continue to be seen and  progressed as tolerated.  ?  ?  ?OBJECTIVE IMPAIRMENTS decreased activity tolerance, decreased endurance, decreased ROM, decreased strength, improper body mechanics, and pain.  ?  ?ACTIVITY LIMITATIONS cleaning, community activity, driving, and yard work.  ?  ?PERSONAL FACTORS Fitness, Time since onset of injury/illness/exacerbation, and 1-2 comorbidities: anxiety and depression  are also affecting patient's functional outcome.  ?  ?  ?GOALS: ?Goals reviewed with patient? Yes ?  ?SHORT TERM GOALS: ?  ?Pt will be compliant and knowledgeable with initial HEP for improved comfort and carryover ?Baseline: initial HEP given ?Target date: 12/30/2021 ?Goal status: INITIAL ?  ?2.  Pt will self report low back pain  no greater than 6/10 for improved comfort and functional ability ?Baseline: 10/10 at worst ?Target date: 12/30/2021 ?Goal status: INITIAL ?  ?LONG TERM GOALS: ?  ?Pt will self report low back pain no greater than 3/10 for improved comfort and functional ability ?Baseline: 10/10 at worst ?Target date: 02/03/2022 ?Goal status: INITIAL ?  ?2.  Pt will decrease ODI score to no greater than 38% disability as proxy for functional improvement ?Baseline: 50% disability ?Target date: 02/03/2022 ?Goal status: INITIAL ?  ?3.  Pt will be able to lift 50lb via deadlift from with no increase in LBP for improved functional ability with ADL performance.  ?Baseline: unable ?Target date: 02/03/2022 ?Goal status: INITIAL ?  ?4.  Pt will increase 30 Second Sit to Stand rep count to no less than 10 reps for improved balance, strength, and functional mobility ?Baseline: 7 ?Target date: 02/03/2022 ?Goal status: INITIAL ?  ?PLAN: ?PT FREQUENCY: 1-2x/week ?  ?PT DURATION: 8 weeks ?  ?PLANNED INTERVENTIONS: Therapeutic exercises, Therapeutic activity, Neuromuscular re-education, Balance training, Gait training, Patient/Family education, Joint mobilization, Aquatic Therapy, Dry Needling, Cryotherapy, Moist heat, and Manual therapy ?  ?PLAN FOR NEXT  SESSION: assess HEP response; proximal hip and core strengthening ? ? ? ?Eloy End, PT ?12/17/2021, 3:59 PM ? ?   ?

## 2021-12-19 ENCOUNTER — Telehealth: Payer: Self-pay

## 2021-12-19 NOTE — Telephone Encounter (Signed)
PT called and left voicemail regarding missed visit and reminder of attendance policy and next appointment. ? ?Eloy End, PT ?12/19/21 4:05 PM ? ?

## 2021-12-24 ENCOUNTER — Ambulatory Visit: Payer: Medicaid Other

## 2021-12-24 ENCOUNTER — Other Ambulatory Visit: Payer: Self-pay

## 2021-12-24 DIAGNOSIS — R2689 Other abnormalities of gait and mobility: Secondary | ICD-10-CM

## 2021-12-24 DIAGNOSIS — M545 Low back pain, unspecified: Secondary | ICD-10-CM | POA: Diagnosis not present

## 2021-12-24 DIAGNOSIS — M6281 Muscle weakness (generalized): Secondary | ICD-10-CM

## 2021-12-24 DIAGNOSIS — G8929 Other chronic pain: Secondary | ICD-10-CM

## 2021-12-24 NOTE — Patient Instructions (Signed)
Aquatic Therapy at [facility]-  What to Expect!  Where:   [facility] [address] [place], [place] [address] Rehab phone [phone]  NOTE:  You will receive an automated phone message reminding you of your appt and it will say the appointment is at the [address] [facility] [address] [facility] clinic.          How to Prepare: Please make sure you drink 8 ounces of water about one hour prior to your pool session A caregiver may attend if needed with the patient to help assist as needed. A caregiver can sit in the pool room on chair. Please arrive IN YOUR SUIT and 15 minutes prior to your appointment - this helps to avoid delays in starting your session. Please make sure to attend to any toileting needs prior to entering the pool Locker rooms for changing are provided.   There is direct access to the pool deck form the locker room.  You can lock your belongings in a locker with lock provided. Once on the pool deck your therapist will ask if you have signed the Patient  Consent and Assignment of Benefits form before beginning treatment Your therapist may take your blood pressure prior to, during and after your session if indicated We usually try and create a home exercise program based on activities we do in the pool.  Please be thinking about who might be able to assist you in the pool should you need to participate in an aquatic home exercise program at the time of discharge if you need assistance.  Some patients do not want to or do not have the ability to participate in an aquatic home program - this is not a barrier in any way to you participating in aquatic therapy as part of your current therapy plan! After Discharge from PT, you can continue using home program at  the [facility]/, there is a drop-in fee for $5 ($45 a month)or for 60 years  or older $4.00 ($40 a month for seniors ) or any local [employer] pool.  Memberships for purchase are  available for gym/pool at [facility]  IT IS VERY IMPORTANT THAT YOUR LAST VISIT BE IN THE CLINIC AT [address] AFTER YOUR LAST AQUATIC VISIT.  PLEASE MAKE SURE THAT YOU HAVE A [address]  APPOINTMENT SCHEDULED.   About the pool: Pool is located approximately 500 FT from the entrance of the building.  Please bring a support person if you need assistance traveling this      distance.   Your therapist will assist you in entering the water; there are two ways to           enter: stairs with railings, and a mechanical lift. Your therapist will determine the most appropriate way for you.  Water temperature is usually between 88-90 degrees  There may be up to 2 other swimmers in the pool at the same time  The pool deck is tile, please wear shoes with good traction if you prefer not to be barefoot.    Contact Info:  For appointment scheduling and cancellations:         Please call the [facility]  PH:[phone]              Aquatic Therapy  Outpatient [facility]       All sessions are 45 minutes                                                    

## 2021-12-24 NOTE — Therapy (Signed)
?OUTPATIENT PHYSICAL THERAPY TREATMENT NOTE ? ? ?Patient Name: Kathryn Andrews ?MRN: [id number] ?DOB:November 04, 1995, 26 y.o., female ?Today's Date: 12/24/2021 ? ?PCP: Idamae Schuller, MD ?REFERRING PROVIDER: Idamae Schuller, MD ? ? PT End of Session - 12/24/21 1518   ? ? Visit Number 3   ? Number of Visits 17   ? Date for PT Re-Evaluation 02/03/22   ? PT Start Time 1530   ? PT Stop Time 1612   ? PT Time Calculation (min) 42 min   ? Activity Tolerance Patient tolerated treatment well   ? Behavior During Therapy [facility] for tasks assessed/performed   ? ?  ?  ? ?  ? ? ?Past Medical History:  ?Diagnosis Date  ? Anxiety   ? Asthma   ? Attention deficit hyperactivity disorder   ? Chest pain   ? Chronic constipation   ? Depression   ? Morbid obesity ([place]) 03/02/2020  ? ?Past Surgical History:  ?Procedure Laterality Date  ? adenoid removal    ? BIOPSY  08/12/2021  ? Procedure: BIOPSY;  Surgeon: Irving Copas., MD;  Location: [facility];  Service: Gastroenterology;;  EGD and COLON  ? COLONOSCOPY WITH PROPOFOL N/A 08/12/2021  ? Procedure: COLONOSCOPY WITH PROPOFOL;  Surgeon: Mansouraty, Telford Nab., MD;  Location: [facility];  Service: Gastroenterology;  Laterality: N/A;  ? ESOPHAGOGASTRODUODENOSCOPY (EGD) WITH PROPOFOL N/A 08/12/2021  ? Procedure: ESOPHAGOGASTRODUODENOSCOPY (EGD) WITH PROPOFOL;  Surgeon: Rush Landmark Telford Nab., MD;  Location: [facility];  Service: Gastroenterology;  Laterality: N/A;  ? EXTERNAL EAR SURGERY    ? POLYPECTOMY  08/12/2021  ? Procedure: POLYPECTOMY;  Surgeon: Mansouraty, Telford Nab., MD;  Location: [facility];  Service: Gastroenterology;;  ? WISDOM TOOTH EXTRACTION    ? ?Patient Active Problem List  ? Diagnosis Date Noted  ? Irritable bowel syndrome with diarrhea 12/15/2021  ? Generalized abdominal pain 12/15/2021  ? Class 3 severe obesity without serious comorbidity with body mass index (BMI) of 60.0 to 69.9 in adult [facility]) 12/15/2021  ? Low back strain 11/18/2021  ? Folliculitis 40/98/1191  ?  Chronic diarrhea 03/08/2021  ? Abdominal pain, epigastric 03/08/2021  ? Anxiety and depression 04/02/2020  ? Chronic lower back pain 04/02/2020  ? Urinary frequency 04/02/2020  ? Healthcare maintenance 03/02/2020  ? Morbid obesity ([place]) 03/02/2020  ? Allergic rhinitis 03/02/2020  ? Functional dyspepsia 02/28/2020  ? Chronic constipation   ? ? ?REFERRING DIAG:  ?S39.012A (ICD-10-CM) - Back strain, initial encounter ? ?THERAPY DIAG:  ?Chronic right-sided low back pain, unspecified whether sciatica present ? ?Muscle weakness (generalized) ? ?Other abnormalities of gait and mobility ? ?PERTINENT HISTORY:  ?Anxiety and depression ? ?PRECAUTIONS:  ?None ? ?SUBJECTIVE:  ?Pt presents to PT with reports of increased back pain compared to previous session. Pt think this is due to shopping and taking groceries out of her car. Pt is ready to begin PT at this time.  ? ?Pain: ?Are you having pain? No ?NPRS: 7/10 ?Pain location: lower back ?PAIN TYPE: aching and sharp ?Pain description: intermittent  ?Aggravating factors: ice/heat; crouching ?Relieving factors: standing >5 min, fwd bending, walking ? ? ?OBJECTIVE:  ?   ?PATIENT SURVEYS:  ?Modified Oswestry 48% disability  ?  ?MUSCLE LENGTH: ?Hamstrings: Right WFL deg; Left WFL deg ?  ?POSTURE:  ?Large body habitus; increased lumbar lordosis ?  ?PALPATION: ?TTP to R sided lumbar paraspinals ?  ?LUMBARAROM/PROM ?  ?A/PROM A/PROM  ?12/09/2021  ?Flexion Increased pain with repeated flexion  ?Extension Extension reduced; pain with repated motions  ? (  Blank rows = not tested) ?  ?LE MMT: ?  ?MMT Right ?12/09/2021 Left ?12/09/2021  ?Hip flexion  5/5 5/5  ?Hip extension      ?Hip abduction 4/5 4/5  ?Hip adduction 3+/5 3+/5  ?Hip external rotation      ?Hip internal rotation      ?Knee extension 5/5 5/5  ?Knee flexion 5/5 5/5  ?Ankle dorsiflexion       ?Ankle plantarflexion      ?Ankle inversion      ?Ankle eversion      ?Grossly      ?(Blank rows = not tested) ?  ?LUMBAR SPECIAL TESTS:   ?Straight leg raise test: Positive Left ?  ?FUNCTIONAL TESTS:  ?30 seconds chair stand test: 7 reps ?  ?GAIT: ?Distance walked: 58f ?Assistive device utilized: None ?Level of assistance: Complete Independence ?Comments: decreased gait speed ?  ?TODAY'S TREATMENT  [facility] 12[facility]20[facility] ?Therapeutic Exercise: ?LTR x 10 ?Supine SLR 2x10 each ?Supine clamshell 2x20 BTB ?Supine march 2x20 BTB ?Supine PPT x 10 - 5" hold ?Supine PPT with ball 2x10 - 5" hold ?Seated physioball rollout fwd/lat x 10 each ?STS from elevated table 2x10 - no UE support ?Standing hip abd/ext 2x10 30# each ?KB deadlift 15# from 12in box x 8 ?Therapeutic Activity: ?Assessment of tests/measures, goals, and outcomes for MCD re-authorization  ? ?[facility]: 12[facility]17/2021 ?Therapeutic Exercise: ?LTR x 10 ?Supine SLR 2x10 each ?Supine clamshell 2x15 GTB ?Supine march 2x20 GTB ?Supine PPT x 10 - 5" hold ?Supine PPT with ball 2x10 - 5" hold ?Seated physioball rollout fwd/lat x 10 each ?STS from elevated table 2x10 - no UE support ? ?[facility] DATE: 12/09/2021 ?Therapeutic Exercise: ?Supine PPT x 5 ?Supine SLR x 5 each ?Seated clamshell x 20 GTB ?Seated ball squeeze x 10 - 5" hold ?  ?PATIENT EDUCATION:  ?Education details: eval findings, ODI, HEP, POC ?Person educated: Patient ?Education method: Explanation, Demonstration, and Handouts ?Education comprehension: verbalized understanding and returned demonstration ?  ?  ?HOME EXERCISE PROGRAM: ?Access Code: [email or web address]?URL: [email or web address] ?Date: 12/17/2021 ?Prepared by: DOctavio Manns? ?Exercises ?Supine Posterior Pelvic Tilt - 1 x daily - 7 x weekly - 2 sets - 10 reps - 5 sec hold ?Supine Active Straight Leg Raise - 1 x daily - 7 x weekly - 2 sets - 10 reps ?Seated Hip Abduction with Resistance - 1 x daily - 7 x weekly - 3 sets - 20 reps - green tband hold ?Seated Hip Adduction Squeeze with Ball - 1 x daily - 7 x weekly - 3 sets - 10 reps  - 5 sec hold ?Sit to Stand - 1 x daily - 7 x weekly - 2 sets - 10 reps ? ?  ?ASSESSMENT: ?  ?CLINICAL IMPRESSION: ?Pt was able to complete all prescribed exercises with no adverse effect. Therapy today focused on improving core and proximal hip strength in order to improve functional mobility and decrease pain. Over the course of PT treatment pt has improved slightly, with small decrease in ODI disability score from 50% to 48%. Her overall threshold of lower back pain is down, but she continues to have significant 7/10 pain. Pt continues to benefit from skilled PT services working on improving core and LE strength. PT will continue with current POC and start alternating gym and pool sessions. Will otherwise continue with current POC as tolerated.  ?  ?OBJECTIVE IMPAIRMENTS decreased activity tolerance, decreased endurance, decreased ROM,  decreased strength, improper body mechanics, and pain.  ?  ?ACTIVITY LIMITATIONS cleaning, community activity, driving, and yard work.  ?  ?PERSONAL FACTORS Fitness, Time since onset of injury/illness/exacerbation, and 1-2 comorbidities: anxiety and depression  are also affecting patient's functional outcome.  ?  ?  ?GOALS: ?Goals reviewed with patient? Yes ?  ?SHORT TERM GOALS: ?  ?Pt will be compliant and knowledgeable with initial HEP for improved comfort and carryover ?Baseline: initial HEP given ?Target date: 12/30/2021 ?Goal status: MET ?  ?2.  Pt will self report low back pain no greater than 6/10 for improved comfort and functional ability ?Baseline: 10/10 at worst ?Target date: 12/30/2021 ?Goal status: MET ?  ?LONG TERM GOALS: ?  ?Pt will self report low back pain no greater than 3/10 for improved comfort and functional ability ?Baseline: 10/10 at worst ?Target date: 02/03/2022 ?Goal status: ONGOING ?  ?2.  Pt will decrease ODI score to no greater than 38% disability as proxy for functional improvement ?Baseline: 50% disability; 12/24/2021 - 48% disability ?Target date:  02/03/2022 ?Goal status: ONGOING ?  ?3.  Pt will be able to lift 50lb via deadlift from with no increase in LBP for improved functional ability with ADL performance.  ?Baseline: unable ?Target date: 02/03/2022 ?Goal status: ON

## 2021-12-27 ENCOUNTER — Other Ambulatory Visit: Payer: Self-pay

## 2021-12-27 ENCOUNTER — Encounter (HOSPITAL_BASED_OUTPATIENT_CLINIC_OR_DEPARTMENT_OTHER): Payer: Self-pay | Admitting: Physical Therapy

## 2021-12-27 ENCOUNTER — Encounter (HOSPITAL_BASED_OUTPATIENT_CLINIC_OR_DEPARTMENT_OTHER): Payer: Medicaid Other | Attending: Internal Medicine | Admitting: Physical Therapy

## 2021-12-27 DIAGNOSIS — G8929 Other chronic pain: Secondary | ICD-10-CM | POA: Diagnosis present

## 2021-12-27 DIAGNOSIS — M6281 Muscle weakness (generalized): Secondary | ICD-10-CM | POA: Insufficient documentation

## 2021-12-27 DIAGNOSIS — R2689 Other abnormalities of gait and mobility: Secondary | ICD-10-CM | POA: Diagnosis present

## 2021-12-27 DIAGNOSIS — M545 Low back pain, unspecified: Secondary | ICD-10-CM | POA: Diagnosis present

## 2021-12-27 NOTE — Therapy (Signed)
?OUTPATIENT PHYSICAL THERAPY TREATMENT NOTE ? ? ?Patient Name: Kathryn Andrews ?MRN: [id number] ?DOB:[birth year], 26 y.o., female ?Today's Date: 12/27/2021 ? ?PCP: Idamae Schuller, MD ?REFERRING PROVIDER: Idamae Schuller, MD ? ? PT End of Session - 12/27/21 1536   ? ? Visit Number 4   ? Number of Visits 17   ? Date for PT Re-Evaluation 02/03/22   ? PT Start Time 1530   ? PT Stop Time 1612   ? PT Time Calculation (min) 42 min   ? Activity Tolerance Patient tolerated treatment well   ? Behavior During Therapy [facility] for tasks assessed/performed   ? ?  ?  ? ?  ? ? ?Past Medical History:  ?Diagnosis Date  ? Anxiety   ? Asthma   ? Attention deficit hyperactivity disorder   ? Chest pain   ? Chronic constipation   ? Depression   ? Morbid obesity ([place]) 03/02/2020  ? ?Past Surgical History:  ?Procedure Laterality Date  ? adenoid removal    ? BIOPSY  08/12/2021  ? Procedure: BIOPSY;  Surgeon: Irving Copas., MD;  Location: [facility];  Service: Gastroenterology;;  EGD and COLON  ? COLONOSCOPY WITH PROPOFOL N/A 08/12/2021  ? Procedure: COLONOSCOPY WITH PROPOFOL;  Surgeon: Mansouraty, Telford Nab., MD;  Location: [facility];  Service: Gastroenterology;  Laterality: N/A;  ? ESOPHAGOGASTRODUODENOSCOPY (EGD) WITH PROPOFOL N/A 08/12/2021  ? Procedure: ESOPHAGOGASTRODUODENOSCOPY (EGD) WITH PROPOFOL;  Surgeon: Rush Landmark Telford Nab., MD;  Location: [facility];  Service: Gastroenterology;  Laterality: N/A;  ? EXTERNAL EAR SURGERY    ? POLYPECTOMY  08/12/2021  ? Procedure: POLYPECTOMY;  Surgeon: Mansouraty, Telford Nab., MD;  Location: [facility];  Service: Gastroenterology;;  ? WISDOM TOOTH EXTRACTION    ? ?Patient Active Problem List  ? Diagnosis Date Noted  ? Irritable bowel syndrome with diarrhea 12/15/2021  ? Generalized abdominal pain 12/15/2021  ? Class 3 severe obesity without serious comorbidity with body mass index (BMI) of 60.0 to 69.9 in adult [facility]) 12/15/2021  ? Low back strain 11/18/2021  ? Folliculitis 15/17/6160  ?  Chronic diarrhea 03/08/2021  ? Abdominal pain, epigastric 03/08/2021  ? Anxiety and depression 04/02/2020  ? Chronic lower back pain 04/02/2020  ? Urinary frequency 04/02/2020  ? Healthcare maintenance 03/02/2020  ? Morbid obesity ([place]) 03/02/2020  ? Allergic rhinitis 03/02/2020  ? Functional dyspepsia 02/28/2020  ? Chronic constipation   ? ? ?REFERRING DIAG:  ?S39.012A (ICD-10-CM) - Back strain, initial encounter ? ?THERAPY DIAG:  ?Chronic right-sided low back pain, unspecified whether sciatica present ? ?Muscle weakness (generalized) ? ?Other abnormalities of gait and mobility ? ?PERTINENT HISTORY:  ?Anxiety and depression ? ?PRECAUTIONS:  ?None ? ?SUBJECTIVE:  ?Pt reports she is tired; stayed up all night playing VR game.  She states her last session (on land) "went great".  She had some soreness but it was tolerable.  ?Pain: ?Are you having pain? Yes ?NPRS: 5/10 ?Pain location: lower back ?PAIN TYPE: aching  ?Pain description: intermittent  ?Aggravating factors: crouching; standing >5 min, ?Relieving factors: Laying down ? ? ?OBJECTIVE:  ?   ?PATIENT SURVEYS:  ?Modified Oswestry 48% disability  ?  ?MUSCLE LENGTH: ?Hamstrings: Right WFL deg; Left WFL deg ?  ?POSTURE:  ?Large body habitus; increased lumbar lordosis ?  ?PALPATION: ?TTP to R sided lumbar paraspinals ?  ?LUMBARAROM/PROM ?  ?A/PROM A/PROM  ?12/09/2021  ?Flexion Increased pain with repeated flexion  ?Extension Extension reduced; pain with repated motions  ? (Blank rows = not tested) ?  ?LE MMT: ?  ?  MMT Right ?12/09/2021 Left ?12/09/2021  ?Hip flexion  5/5 5/5  ?Hip extension      ?Hip abduction 4/5 4/5  ?Hip adduction 3+/5 3+/5  ?Hip external rotation      ?Hip internal rotation      ?Knee extension 5/5 5/5  ?Knee flexion 5/5 5/5  ?Ankle dorsiflexion       ?Ankle plantarflexion      ?Ankle inversion      ?Ankle eversion      ?Grossly      ?(Blank rows = not tested) ?  ?LUMBAR SPECIAL TESTS:  ?Straight leg raise test: Positive Left ?  ?FUNCTIONAL TESTS:  ?30  seconds chair stand test: 7 reps ?  ?GAIT: ?Distance walked: 75f ?Assistive device utilized: None ?Level of assistance: Complete Independence ?Comments: decreased gait speed ?  ?TODAY'S TREATMENT  ?[facility]: 12[facility]21 ?Pt seen for aquatic therapy today.  Treatment took place in water 3.25-4 ft in [facility]. Temp of water was 91?.  Pt entered/exited the pool via stairs independently with single rail. ?Forward/ backward walking for warm up ? Hip abdct/add x 10 x 2 each  ? Marching, with rainbow dumbbells under water for core activation, then forward walking with dumbbells under water.  ? Sit to/from stand on high bench in water x 10, core engaged ? Squats pushing yellow dumbbell under water x 10 ? Kickboard push/pull (submerged vertically in water) ? Resisted trunk rotation with kickboard on side x 10 each, seated ?Seated forward flexion hands on kickboard fwd/lat x 10 each ?Attempt at bicycle and cross country skii with yellow noodle between legs- ?Jumping jacks with yellow noodle between legs x 10 ?Supported back float for decompression - with/ without pelvic tilts ?    ? ?[facility] Adult PT Treatment: DATE: 12/24/2021 ?Therapeutic Exercise: ?LTR x 10 ?Supine SLR 2x10 each ?Supine clamshell 2x20 BTB ?Supine march 2x20 BTB ?Supine PPT x 10 - 5" hold ?Supine PPT with ball 2x10 - 5" hold ?Seated physioball rollout fwd/lat x 10 each ?STS from elevated table 2x10 - no UE support ?Standing hip abd/ext 2x10 30# each ?KB deadlift 15# from 12in box x 8 ?Therapeutic Activity: ?Assessment of tests/measures, goals, and outcomes for MCD re-authorization  ? ?[facility]: 12[facility] ?Therapeutic Exercise: ?LTR x 10 ?Supine SLR 2x10 each ?Supine clamshell 2x15 GTB ?Supine march 2x20 GTB ?Supine PPT x 10 - 5" hold ?Supine PPT with ball 2x10 - 5" hold ?Seated physioball rollout fwd/lat x 10 each ?STS from elevated table 2x10 - no UE support ? ?[facility]  DATE: 12/09/2021 ?Therapeutic Exercise: ?Supine PPT x 5 ?Supine SLR x 5 each ?Seated clamshell x 20 GTB ?Seated ball squeeze x 10 - 5" hold ?  ?PATIENT EDUCATION:  ?Education details: intro to a[employer] DOMS expectations ?Person educated: Patient ?Education method: Explanation, Demonstration ?Education comprehension: verbalized understanding and returned demonstration ?  ?  ?HOME EXERCISE PROGRAM: ?Access Code: [email or web address]?URL: [email or web address] ?Date: 12/17/2021 ?Prepared by: DOctavio Manns? ?Exercises ?Supine Posterior Pelvic Tilt - 1 x daily - 7 x weekly - 2 sets - 10 reps - 5 sec hold ?Supine Active Straight Leg Raise - 1 x daily - 7 x weekly - 2 sets - 10 reps ?Seated Hip Abduction with Resistance - 1 x daily - 7 x weekly - 3 sets - 20 reps - green tband hold ?Seated Hip Adduction Squeeze with Ball - 1 x daily - 7 x weekly - 3  sets - 10 reps - 5 sec hold ?Sit to Stand - 1 x daily - 7 x weekly - 2 sets - 10 reps ? ?  ?ASSESSMENT: ?  ?CLINICAL IMPRESSION: ?Pt reported elimination of LBP after exercising in water ~20 min.  Upper back began to bother her after submerging kickboard in water; resolved with supine float/rest.  She is confident in aquatic environment and can receive instruction from therapist on deck.  Pt continues to benefit from skilled PT services working on improving core and LE strength. PT will continue with current POC and start alternating gym and pool sessions. Will otherwise continue with current POC as tolerated.  ?  ?OBJECTIVE IMPAIRMENTS decreased activity tolerance, decreased endurance, decreased ROM, decreased strength, improper body mechanics, and pain.  ?  ?ACTIVITY LIMITATIONS cleaning, community activity, driving, and yard work.  ?  ?PERSONAL FACTORS Fitness, Time since onset of injury/illness/exacerbation, and 1-2 comorbidities: anxiety and depression  are also affecting patient's functional outcome.  ?  ?  ?GOALS: ?Goals reviewed with patient? Yes ?  ?SHORT TERM  GOALS: ?  ?Pt will be compliant and knowledgeable with initial HEP for improved comfort and carryover ?Baseline: initial HEP given ?Target date: 12/30/2021 ?Goal status: MET ?  ?2.  Pt will self report low back pain n

## 2021-12-31 ENCOUNTER — Ambulatory Visit: Payer: Medicaid Other

## 2021-12-31 NOTE — Therapy (Incomplete)
?OUTPATIENT PHYSICAL THERAPY TREATMENT NOTE ? ? ?Patient Name: Kathryn Andrews ?MRN: [id number] ?DOB:April 18, 1996, 26 y.o., female ?Today's Date: 12/31/2021 ? ?PCP: Idamae Schuller, MD ?REFERRING PROVIDER: Idamae Schuller, MD ? ? ? ? ?Past Medical History:  ?Diagnosis Date  ? Anxiety   ? Asthma   ? Attention deficit hyperactivity disorder   ? Chest pain   ? Chronic constipation   ? Depression   ? Morbid obesity ([place]) 03/02/2020  ? ?Past Surgical History:  ?Procedure Laterality Date  ? adenoid removal    ? BIOPSY  08/12/2021  ? Procedure: BIOPSY;  Surgeon: Irving Copas., MD;  Location: [facility];  Service: Gastroenterology;;  EGD and COLON  ? COLONOSCOPY WITH PROPOFOL N/A 08/12/2021  ? Procedure: COLONOSCOPY WITH PROPOFOL;  Surgeon: Mansouraty, Telford Nab., MD;  Location: [facility];  Service: Gastroenterology;  Laterality: N/A;  ? ESOPHAGOGASTRODUODENOSCOPY (EGD) WITH PROPOFOL N/A 08/12/2021  ? Procedure: ESOPHAGOGASTRODUODENOSCOPY (EGD) WITH PROPOFOL;  Surgeon: Rush Landmark Telford Nab., MD;  Location: [facility];  Service: Gastroenterology;  Laterality: N/A;  ? EXTERNAL EAR SURGERY    ? POLYPECTOMY  08/12/2021  ? Procedure: POLYPECTOMY;  Surgeon: Mansouraty, Telford Nab., MD;  Location: [facility];  Service: Gastroenterology;;  ? WISDOM TOOTH EXTRACTION    ? ?Patient Active Problem List  ? Diagnosis Date Noted  ? Irritable bowel syndrome with diarrhea 12/15/2021  ? Generalized abdominal pain 12/15/2021  ? Class 3 severe obesity without serious comorbidity with body mass index (BMI) of 60.0 to 69.9 in adult [facility]) 12/15/2021  ? Low back strain 11/18/2021  ? Folliculitis 24/40/1027  ? Chronic diarrhea 03/08/2021  ? Abdominal pain, epigastric 03/08/2021  ? Anxiety and depression 04/02/2020  ? Chronic lower back pain 04/02/2020  ? Urinary frequency 04/02/2020  ? Healthcare maintenance 03/02/2020  ? Morbid obesity ([place]) 03/02/2020  ? Allergic rhinitis 03/02/2020  ? Functional dyspepsia 02/28/2020  ? Chronic constipation    ? ? ?REFERRING DIAG:  ?S39.012A (ICD-10-CM) - Back strain, initial encounter ? ?THERAPY DIAG:  ?No diagnosis found. ? ?PERTINENT HISTORY:  ?Anxiety and depression ? ?PRECAUTIONS:  ?None ? ?SUBJECTIVE:  ?*** ? ?Pain: ?Are you having pain? No ?NPRS: 7/10 ?Pain location: lower back ?PAIN TYPE: aching and sharp ?Pain description: intermittent  ?Aggravating factors: ice/heat; crouching ?Relieving factors: standing >5 min, fwd bending, walking ? ? ?OBJECTIVE:  ?   ?PATIENT SURVEYS:  ?Modified Oswestry 48% disability  ?  ?MUSCLE LENGTH: ?Hamstrings: Right WFL deg; Left WFL deg ?  ?POSTURE:  ?Large body habitus; increased lumbar lordosis ?  ?PALPATION: ?TTP to R sided lumbar paraspinals ?  ?LUMBARAROM/PROM ?  ?A/PROM A/PROM  ?12/09/2021  ?Flexion Increased pain with repeated flexion  ?Extension Extension reduced; pain with repated motions  ? (Blank rows = not tested) ?  ?LE MMT: ?  ?MMT Right ?12/09/2021 Left ?12/09/2021  ?Hip flexion  5/5 5/5  ?Hip extension      ?Hip abduction 4/5 4/5  ?Hip adduction 3+/5 3+/5  ?Hip external rotation      ?Hip internal rotation      ?Knee extension 5/5 5/5  ?Knee flexion 5/5 5/5  ?Ankle dorsiflexion       ?Ankle plantarflexion      ?Ankle inversion      ?Ankle eversion      ?Grossly      ?(Blank rows = not tested) ?  ?LUMBAR SPECIAL TESTS:  ?Straight leg raise test: Positive Left ?  ?FUNCTIONAL TESTS:  ?30 seconds chair stand test: 7 reps ?  ?GAIT: ?Distance walked:  32f ?Assistive device utilized: None ?Level of assistance: Complete Independence ?Comments: decreased gait speed ?  ?TODAY'S TREATMENT  [facility] ?Therapeutic Exercise: ?LTR x 10 ?Supine SLR 2x10 each ?Supine clamshell 2x20 BTB ?Supine march 2x20 BTB ?Supine PPT x 10 - 5" hold ?Supine PPT with ball 2x10 - 5" hold ?Seated physioball rollout fwd/lat x 10 each ?STS from elevated table 2x10 - no UE support ?Standing hip abd/ext 2x10 30# each ?KB deadlift 15# from 12in box x 8 ?Therapeutic  Activity: ?Assessment of tests/measures, goals, and outcomes for MCD re-authorization  ? ?[facility] ?Therapeutic Exercise: ?LTR x 10 ?Supine SLR 2x10 each ?Supine clamshell 2x20 BTB ?Supine march 2x20 BTB ?Supine PPT x 10 - 5" hold ?Supine PPT with ball 2x10 - 5" hold ?Seated physioball rollout fwd/lat x 10 each ?STS from elevated table 2x10 - no UE support ?Standing hip abd/ext 2x10 30# each ?KB deadlift 15# from 12in box x 8 ?Therapeutic Activity: ?Assessment of tests/measures, goals, and outcomes for MCD re-authorization  ? ?[facility]: 12/17/2021 ?Therapeutic Exercise: ?LTR x 10 ?Supine SLR 2x10 each ?Supine clamshell 2x15 GTB ?Supine march 2x20 GTB ?Supine PPT x 10 - 5" hold ?Supine PPT with ball 2x10 - 5" hold ?Seated physioball rollout fwd/lat x 10 each ?STS from elevated table 2x10 - no UE support ? ?[facility] DATE: 12/09/2021 ?Therapeutic Exercise: ?Supine PPT x 5 ?Supine SLR x 5 each ?Seated clamshell x 20 GTB ?Seated ball squeeze x 10 - 5" hold ?  ?PATIENT EDUCATION:  ?Education details: eval findings, ODI, HEP, POC ?Person educated: Patient ?Education method: Explanation, Demonstration, and Handouts ?Education comprehension: verbalized understanding and returned demonstration ?  ?  ?HOME EXERCISE PROGRAM: ?Access Code: [email or web address]?URL: [email or web address] ?Date: 12/17/2021 ?Prepared by: DOctavio Manns? ?Exercises ?Supine Posterior Pelvic Tilt - 1 x daily - 7 x weekly - 2 sets - 10 reps - 5 sec hold ?Supine Active Straight Leg Raise - 1 x daily - 7 x weekly - 2 sets - 10 reps ?Seated Hip Abduction with Resistance - 1 x daily - 7 x weekly - 3 sets - 20 reps - green tband hold ?Seated Hip Adduction Squeeze with Ball - 1 x daily - 7 x weekly - 3 sets - 10 reps - 5 sec hold ?Sit to Stand - 1 x daily - 7 x weekly - 2 sets - 10 reps ? ?  ?ASSESSMENT: ?  ?CLINICAL IMPRESSION: ?*** ?  ?OBJECTIVE IMPAIRMENTS decreased activity tolerance,  decreased endurance, decreased ROM, decreased strength, improper body mechanics, and pain.  ?  ?ACTIVITY LIMITATIONS cleaning, community activity, driving, and yard work.  ?  ?PERSONAL FACTORS Fitness, Time since onset of injury/illness/exacerbation, and 1-2 comorbidities: anxiety and depression  are also affecting patient's functional outcome.  ?  ?  ?GOALS: ?Goals reviewed with patient? Yes ?  ?SHORT TERM GOALS: ?  ?Pt will be compliant and knowledgeable with initial HEP for improved comfort and carryover ?Baseline: initial HEP given ?Target date: 12/30/2021 ?Goal status: MET ?  ?2.  Pt will self report low back pain no greater than 6/10 for improved comfort and functional ability ?Baseline: 10/10 at worst ?Target date: 12/30/2021 ?Goal status: MET ?  ?LONG TERM GOALS: ?  ?Pt will self report low back pain no greater than 3/10 for improved comfort and functional ability ?Baseline: 10/10 at worst ?Target date: 02/03/2022 ?Goal status: ONGOING ?  ?2.  Pt will decrease ODI score  to no greater than 38% disability as proxy for functional improvement ?Baseline: 50% disability; 12/24/2021 - 48% disability ?Target date: 02/03/2022 ?Goal status: ONGOING ?  ?3.  Pt will be able to lift 50lb via deadlift from with no increase in LBP for improved functional ability with ADL performance.  ?Baseline: unable ?Target date: 02/03/2022 ?Goal status: ONGOING ?  ?4.  Pt will increase 30 Second Sit to Stand rep count to no less than 10 reps for improved balance, strength, and functional mobility ?Baseline: 7 ?Target date: 02/03/2022 ?Goal status: ONGOING ?  ?PLAN: ?PT FREQUENCY: 2x/week ?  ?PT DURATION: 6 weeks ?  ?PLANNED INTERVENTIONS: Therapeutic exercises, Therapeutic activity, Neuromuscular re-education, Balance training, Gait training, Patient/Family education, Joint mobilization, Aquatic Therapy, Dry Needling, Cryotherapy, Moist heat, and Manual therapy ?  ?PLAN FOR NEXT SESSION: assess HEP response; proximal hip and core  strengthening ? ? ? ?Ward Chatters, PT ?12/31/2021, 8:39 AM ? ?   ?

## 2022-01-02 ENCOUNTER — Ambulatory Visit: Payer: Medicaid Other

## 2022-01-02 DIAGNOSIS — M6281 Muscle weakness (generalized): Secondary | ICD-10-CM

## 2022-01-02 DIAGNOSIS — R2689 Other abnormalities of gait and mobility: Secondary | ICD-10-CM

## 2022-01-02 DIAGNOSIS — M545 Low back pain, unspecified: Secondary | ICD-10-CM

## 2022-01-02 NOTE — Therapy (Addendum)
?OUTPATIENT PHYSICAL THERAPY TREATMENT NOTE ? ? ?Patient Name: Kathryn Andrews ?MRN: [id number] ?DOB:[birth year], 26 y.o., female ?Today's Date: 01/02/2022 ? ?PCP: Idamae Schuller, MD ?REFERRING PROVIDER: Idamae Schuller, MD ? ? PT End of Session - 01/02/22 1518   ? ? Visit Number [phone]   ? Number of Visits 17   ? Date for PT Re-Evaluation 02/03/22   ? PT Start Time 1530   ? PT Stop Time 6767   ? PT Time Calculation (min) 38 min   ? Activity Tolerance Patient tolerated treatment well   ? Behavior During Therapy [facility] for tasks assessed/performed   ? ?  ?  ? ?  ? ? ? ?Past Medical History:  ?Diagnosis Date  ? Anxiety   ? Asthma   ? Attention deficit hyperactivity disorder   ? Chest pain   ? Chronic constipation   ? Depression   ? Morbid obesity ([place]) 03/02/2020  ? ?Past Surgical History:  ?Procedure Laterality Date  ? adenoid removal    ? BIOPSY  08/12/2021  ? Procedure: BIOPSY;  Surgeon: Irving Copas., MD;  Location: [facility];  Service: Gastroenterology;;  EGD and COLON  ? COLONOSCOPY WITH PROPOFOL N/A 08/12/2021  ? Procedure: COLONOSCOPY WITH PROPOFOL;  Surgeon: Mansouraty, Telford Nab., MD;  Location: [facility];  Service: Gastroenterology;  Laterality: N/A;  ? ESOPHAGOGASTRODUODENOSCOPY (EGD) WITH PROPOFOL N/A 08/12/2021  ? Procedure: ESOPHAGOGASTRODUODENOSCOPY (EGD) WITH PROPOFOL;  Surgeon: Rush Landmark Telford Nab., MD;  Location: [facility];  Service: Gastroenterology;  Laterality: N/A;  ? EXTERNAL EAR SURGERY    ? POLYPECTOMY  08/12/2021  ? Procedure: POLYPECTOMY;  Surgeon: Mansouraty, Telford Nab., MD;  Location: [facility];  Service: Gastroenterology;;  ? WISDOM TOOTH EXTRACTION    ? ?Patient Active Problem List  ? Diagnosis Date Noted  ? Irritable bowel syndrome with diarrhea 12/15/2021  ? Generalized abdominal pain 12/15/2021  ? Class 3 severe obesity without serious comorbidity with body mass index (BMI) of 60.0 to 69.9 in adult [facility]) 12/15/2021  ? Low back strain 11/18/2021  ? Folliculitis 20/94/7096  ?  Chronic diarrhea 03/08/2021  ? Abdominal pain, epigastric 03/08/2021  ? Anxiety and depression 04/02/2020  ? Chronic lower back pain 04/02/2020  ? Urinary frequency 04/02/2020  ? Healthcare maintenance 03/02/2020  ? Morbid obesity ([place]) 03/02/2020  ? Allergic rhinitis 03/02/2020  ? Functional dyspepsia 02/28/2020  ? Chronic constipation   ? ? ?REFERRING DIAG:  ?S39.012A (ICD-10-CM) - Back strain, initial encounter ? ?THERAPY DIAG:  ?Chronic right-sided low back pain, unspecified whether sciatica present ? ?Muscle weakness (generalized) ? ?Other abnormalities of gait and mobility ? ?PERTINENT HISTORY:  ?Anxiety and depression ? ?PRECAUTIONS:  ?None ? ?SUBJECTIVE:  ?Pt presents to PT with no current reports of LBP. Has been compliant with HEP with no adverse effect.  ? ?Pain: ?Are you having pain? No ?NPRS: 7/10 ?Pain location: lower back ?PAIN TYPE: aching and sharp ?Pain description: intermittent  ?Aggravating factors: ice/heat; crouching ?Relieving factors: standing >5 min, fwd bending, walking ? ? ?OBJECTIVE:  ?   ?PATIENT SURVEYS:  ?Modified Oswestry 48% disability  ?  ?MUSCLE LENGTH: ?Hamstrings: Right WFL deg; Left WFL deg ?  ?POSTURE:  ?Large body habitus; increased lumbar lordosis ?  ?PALPATION: ?TTP to R sided lumbar paraspinals ?  ?LUMBARAROM/PROM ?  ?A/PROM A/PROM  ?12/09/2021  ?Flexion Increased pain with repeated flexion  ?Extension Extension reduced; pain with repated motions  ? (Blank rows = not tested) ?  ?LE MMT: ?  ?MMT Right ?12/09/2021 Left ?12/09/2021  ?  Hip flexion  5/5 5/5  ?Hip extension      ?Hip abduction 4/5 4/5  ?Hip adduction 3+/5 3+/5  ?Hip external rotation      ?Hip internal rotation      ?Knee extension 5/5 5/5  ?Knee flexion 5/5 5/5  ?Ankle dorsiflexion       ?Ankle plantarflexion      ?Ankle inversion      ?Ankle eversion      ?Grossly      ?(Blank rows = not tested) ?  ?LUMBAR SPECIAL TESTS:  ?Straight leg raise test: Positive Left ?  ?FUNCTIONAL TESTS:  ?30 seconds chair stand test: 7  reps ?  ?GAIT: ?Distance walked: 57f ?Assistive device utilized: None ?Level of assistance: Complete Independence ?Comments: decreased gait speed ?  ?TODAY'S TREATMENT  ?[facility] ?Therapeutic Exercise: ?LTR x 10 ?Supine SLR 2x10 each ?Supine clamshell 3x20 BTB ?Supine march 3x20 BTB ?STS from elevated table 2x10 - holding 10lb KB ?Standing hip abd 2x10 30# each ?Leg press 3x10 60# ?KB deadlift 15# from 12in box 2x8 ? ?[facility]: 12[facility] ?Therapeutic Exercise: ?LTR x 10 ?Supine SLR 2x10 each ?Supine clamshell 2x20 BTB ?Supine march 2x20 BTB ?Supine PPT x 10 - 5" hold ?Supine PPT with ball 2x10 - 5" hold ?Seated physioball rollout fwd/lat x 10 each ?STS from elevated table 2x10 - no UE support ?Standing hip abd/ext 2x10 30# each ?KB deadlift 15# from 12in box x 8 ?Therapeutic Activity: ?Assessment of tests/measures, goals, and outcomes for MCD re-authorization  ? ?[facility]: 12/17/2021 ?Therapeutic Exercise: ?LTR x 10 ?Supine SLR 2x10 each ?Supine clamshell 2x15 GTB ?Supine march 2x20 GTB ?Supine PPT x 10 - 5" hold ?Supine PPT with ball 2x10 - 5" hold ?Seated physioball rollout fwd/lat x 10 each ?STS from elevated table 2x10 - no UE support ? ?PATIENT EDUCATION:  ?Education details: eval findings, ODI, HEP, POC ?Person educated: Patient ?Education method: Explanation, Demonstration, and Handouts ?Education comprehension: verbalized understanding and returned demonstration ?  ?  ?HOME EXERCISE PROGRAM: ?Access Code: [email or web address]?URL: [email or web address] ?Date: 12/17/2021 ?Prepared by: DOctavio Manns? ?Exercises ?Supine Posterior Pelvic Tilt - 1 x daily - 7 x weekly - 2 sets - 10 reps - 5 sec hold ?Supine Active Straight Leg Raise - 1 x daily - 7 x weekly - 2 sets - 10 reps ?Seated Hip Abduction with Resistance - 1 x daily - 7 x weekly - 3 sets - 20 reps - green tband hold ?Seated Hip Adduction Squeeze with Ball - 1 x daily - 7 x  weekly - 3 sets - 10 reps - 5 sec hold ?Sit to Stand - 1 x daily - 7 x weekly - 2 sets - 10 reps ? ?  ?ASSESSMENT: ?  ?CLINICAL IMPRESSION: ?Pt was able to complete all prescribed exercises with no adverse effect or change in baseline pain. Therapy today continued to focus on improving core and proximal hip strength in order to decrease pain and improve comfort. She continues to benefit from skilled PT and will continue to be seen per POC. ?  ?OBJECTIVE IMPAIRMENTS decreased activity tolerance, decreased endurance, decreased ROM, decreased strength, improper body mechanics, and pain.  ?  ?ACTIVITY LIMITATIONS cleaning, community activity, driving, and yard work.  ?  ?PERSONAL FACTORS Fitness, Time since onset of injury/illness/exacerbation, and 1-2 comorbidities: anxiety and depression  are also affecting patient's functional outcome.  ?  ?  ?GOALS: ?Goals reviewed with patient?  Yes ?  ?SHORT TERM GOALS: ?  ?Pt will be compliant and knowledgeable with initial HEP for improved comfort and carryover ?Baseline: initial HEP given ?Target date: 12/30/2021 ?Goal status: MET ?  ?2.  Pt will self report low back pain no greater than 6/10 for improved comfort and functional ability ?Baseline: 10/10 at worst ?Target date: 12/30/2021 ?Goal status: MET ?  ?LONG TERM GOALS: ?  ?Pt will self report low back pain no greater than 3/10 for improved comfort and functional ability ?Baseline: 10/10 at worst ?Target date: 02/03/2022 ?Goal status: ONGOING ?  ?2.  Pt will decrease ODI score to no greater than 38% disability as proxy for functional improvement ?Baseline: 50% disability; 12/24/2021 - 48% disability ?Target date: 02/03/2022 ?Goal status: ONGOING ?  ?3.  Pt will be able to lift 50lb via deadlift from with no increase in LBP for improved functional ability with ADL performance.  ?Baseline: unable ?Target date: 02/03/2022 ?Goal status: ONGOING ?  ?4.  Pt will increase 30 Second Sit to Stand rep count to no less than 10 reps for improved  balance, strength, and functional mobility ?Baseline: 7 ?Target date: 02/03/2022 ?Goal status: ONGOING ?  ?PLAN: ?PT FREQUENCY: 2x/week ?  ?PT DURATION: 6 weeks ?  ?PLANNED INTERVENTIONS: Therapeutic exercises, T

## 2022-01-07 ENCOUNTER — Ambulatory Visit (HOSPITAL_BASED_OUTPATIENT_CLINIC_OR_DEPARTMENT_OTHER): Payer: Medicaid Other | Attending: Internal Medicine | Admitting: Physical Therapy

## 2022-01-07 ENCOUNTER — Encounter (HOSPITAL_BASED_OUTPATIENT_CLINIC_OR_DEPARTMENT_OTHER): Payer: Self-pay | Admitting: Physical Therapy

## 2022-01-07 DIAGNOSIS — G8929 Other chronic pain: Secondary | ICD-10-CM

## 2022-01-07 DIAGNOSIS — M6281 Muscle weakness (generalized): Secondary | ICD-10-CM

## 2022-01-07 DIAGNOSIS — S39012A Strain of muscle, fascia and tendon of lower back, initial encounter: Secondary | ICD-10-CM | POA: Insufficient documentation

## 2022-01-07 DIAGNOSIS — Y939 Activity, unspecified: Secondary | ICD-10-CM | POA: Insufficient documentation

## 2022-01-07 DIAGNOSIS — R2689 Other abnormalities of gait and mobility: Secondary | ICD-10-CM

## 2022-01-07 NOTE — Therapy (Signed)
?OUTPATIENT PHYSICAL THERAPY TREATMENT NOTE ? ? ?Patient Name: Kathryn Andrews ?MRN: [id number] ?DOB:1995-10-16, 26 y.o., female ?Today's Date: 01/07/2022 ? ?PCP: Idamae Schuller, MD ?REFERRING PROVIDER: Lucious Groves, DO ? ? PT End of Session - 01/07/22 1533   ? ? Visit Number 6   ? Number of Visits 17   ? Date for PT Re-Evaluation 02/03/22   ? PT Start Time 1533   ? PT Stop Time [id number]   ? PT Time Calculation (min) 41 min   ? Activity Tolerance Patient tolerated treatment well   ? Behavior During Therapy [facility] for tasks assessed/performed   ? ?  ?  ? ?  ? ? ? ?Past Medical History:  ?Diagnosis Date  ? Anxiety   ? Asthma   ? Attention deficit hyperactivity disorder   ? Chest pain   ? Chronic constipation   ? Depression   ? Morbid obesity ([place]) 03/02/2020  ? ?Past Surgical History:  ?Procedure Laterality Date  ? adenoid removal    ? BIOPSY  08/12/2021  ? Procedure: BIOPSY;  Surgeon: Irving Copas., MD;  Location: [facility];  Service: Gastroenterology;;  EGD and COLON  ? COLONOSCOPY WITH PROPOFOL N/A 08/12/2021  ? Procedure: COLONOSCOPY WITH PROPOFOL;  Surgeon: Mansouraty, Telford Nab., MD;  Location: [facility];  Service: Gastroenterology;  Laterality: N/A;  ? ESOPHAGOGASTRODUODENOSCOPY (EGD) WITH PROPOFOL N/A 08/12/2021  ? Procedure: ESOPHAGOGASTRODUODENOSCOPY (EGD) WITH PROPOFOL;  Surgeon: Rush Landmark Telford Nab., MD;  Location: [facility];  Service: Gastroenterology;  Laterality: N/A;  ? EXTERNAL EAR SURGERY    ? POLYPECTOMY  08/12/2021  ? Procedure: POLYPECTOMY;  Surgeon: Mansouraty, Telford Nab., MD;  Location: [facility];  Service: Gastroenterology;;  ? WISDOM TOOTH EXTRACTION    ? ?Patient Active Problem List  ? Diagnosis Date Noted  ? Irritable bowel syndrome with diarrhea 12/15/2021  ? Generalized abdominal pain 12/15/2021  ? Class 3 severe obesity without serious comorbidity with body mass index (BMI) of 60.0 to 69.9 in adult [facility]) 12/15/2021  ? Low back strain 11/18/2021  ? Folliculitis [id number]  ?  Chronic diarrhea 03/08/2021  ? Abdominal pain, epigastric 03/08/2021  ? Anxiety and depression 04/02/2020  ? Chronic lower back pain 04/02/2020  ? Urinary frequency 04/02/2020  ? Healthcare maintenance 03/02/2020  ? Morbid obesity ([place]) 03/02/2020  ? Allergic rhinitis 03/02/2020  ? Functional dyspepsia 02/28/2020  ? Chronic constipation   ? ? ?REFERRING DIAG:  ?S39.012A (ICD-10-CM) - Back strain, initial encounter ? ?THERAPY DIAG:  ?Chronic right-sided low back pain, unspecified whether sciatica present ? ?Muscle weakness (generalized) ? ?Other abnormalities of gait and mobility ? ?PERTINENT HISTORY:  ?Anxiety and depression ? ?PRECAUTIONS:  ?None ? ?SUBJECTIVE:  ?No pain "Good day"  Felt good after last treatment ? ?Pain: ?Are you having pain? No ?NPRS: 7/10 ?Pain location: lower back ?PAIN TYPE: aching and sharp ?Pain description: intermittent  ?Aggravating factors: ice/heat; crouching ?Relieving factors: standing >5 min, fwd bending, walking ? ? ?OBJECTIVE:  ?   ?PATIENT SURVEYS:  ?Modified Oswestry 48% disability  ?  ?MUSCLE LENGTH: ?Hamstrings: Right WFL deg; Left WFL deg ?  ?POSTURE:  ?Large body habitus; increased lumbar lordosis ?  ?PALPATION: ?TTP to R sided lumbar paraspinals ?  ?LUMBARAROM/PROM ?  ?A/PROM A/PROM  ?12/09/2021  ?Flexion Increased pain with repeated flexion  ?Extension Extension reduced; pain with repated motions  ? (Blank rows = not tested) ?  ?LE MMT: ?  ?MMT Right ?12/09/2021 Left ?12/09/2021  ?Hip flexion  5/5 5/5  ?Hip extension      ?  Hip abduction 4/5 4/5  ?Hip adduction 3+/5 3+/5  ?Hip external rotation      ?Hip internal rotation      ?Knee extension 5/5 5/5  ?Knee flexion 5/5 5/5  ?Ankle dorsiflexion       ?Ankle plantarflexion      ?Ankle inversion      ?Ankle eversion      ?Grossly      ?(Blank rows = not tested) ?  ?LUMBAR SPECIAL TESTS:  ?Straight leg raise test: Positive Left ?  ?FUNCTIONAL TESTS:  ?30 seconds chair stand test: 7 reps ?  ?GAIT: ?Distance walked: 77ft ?Assistive  device utilized: None ?Level of assistance: Complete Independence ?Comments: decreased gait speed ?  ?TODAY'S TREATMENT  ? ?[facility] Adult PT Treatment: DATE: 01/07/2022 ?Pt seen for aquatic therapy today.  Treatment took place in water 3.25-4 ft in depth at the [employer] pool. Temp of water was 91?.  Pt entered/exited the pool via stairs independently with single rail. ?Forward/ backward walking for warm up ?4 widths with hand buoys submerged for core engagement ?Seated flutter kicking (SLR) 3x20 ?STS from 3rd (bottom) step x 10. Cues for engaged core ?Add/abd 3x20. Cues for increased speed for increased resistance ?Kick board push pull with core engaged 3x20 ?Resisted trunk rotation with kickboard on side x 10ea  R/L ?Side lunges with yellow hand buoys shoulder add/abd x 4 widths ?Prone suspension with hand buoys for LB and abdominal stretching 2x30s ?Supine suspension with knees to chest x10 ?Cycling on yellow noodle x 5 minutes warm down. ? ?Pt requires buoyancy for support and to offload joints with strengthening exercises. Viscosity of the water is needed for resistance of strengthening; water current perturbations provides challenge to standing balance unsupported, requiring increased core activation. ? ?            ?            ?          ?           ?            ? ?[facility] Adult PT Treatment: DATE: 01/02/2022 ?Therapeutic Exercise: ?LTR x 10 ?Supine SLR 2x10 each ?Supine clamshell 3x20 BTB ?Supine march 3x20 BTB ?STS from elevated table 2x10 - holding 10lb KB ?Standing hip abd 2x10 30# each ?Leg press 3x10 60# ?KB deadlift 15# from 12in box 2x8 ? ?[facility] Adult PT Treatment: DATE: 12/24/2021 ?Therapeutic Exercise: ?LTR x 10 ?Supine SLR 2x10 each ?Supine clamshell 2x20 BTB ?Supine march 2x20 BTB ?Supine PPT x 10 - 5" hold ?Supine PPT with ball 2x10 - 5" hold ?Seated physioball rollout fwd/lat x 10 each ?STS from elevated table 2x10 - no UE support ?Standing hip abd/ext 2x10 30# each ?KB deadlift 15# from 12in  box x 8 ?Therapeutic Activity: ?Assessment of tests/measures, goals, and outcomes for MCD re-authorization  ? ?[facility] Adult PT Treatment: DATE: 12/17/2021 ?Therapeutic Exercise: ?LTR x 10 ?Supine SLR 2x10 each ?Supine clamshell 2x15 GTB ?Supine march 2x20 GTB ?Supine PPT x 10 - 5" hold ?Supine PPT with ball 2x10 - 5" hold ?Seated physioball rollout fwd/lat x 10 each ?STS from elevated table 2x10 - no UE support ? ?PATIENT EDUCATION:  ?Education details: eval findings, ODI, HEP, POC ?Person educated: Patient ?Education method: Explanation, Demonstration, and Handouts ?Education comprehension: verbalized understanding and returned demonstration ?  ?  ?HOME EXERCISE PROGRAM: ?Access Code: [email or web address] ?URL: [email or web address] ?Date: 12/17/2021 ?Prepared by: Octavio Manns ? ?Exercises ?Supine Posterior Pelvic Tilt - 1  x daily - 7 x weekly - 2 sets - 10 reps - 5 sec hold ?Supine Active Straight Leg Raise - 1 x daily - 7 x weekly - 2 sets - 10 reps ?Seated Hip Abduction with Resistance - 1 x daily - 7 x weekly - 3 sets - 20 reps - green tband hold ?Seated Hip Adduction Squeeze with Ball - 1 x daily - 7 x weekly - 3 sets - 10 reps - 5 sec hold ?Sit to Stand - 1 x daily - 7 x weekly - 2 sets - 10 reps ? ?  ?ASSESSMENT: ?  ?CLINICAL IMPRESSION: ?Pt reports good response from last visit.  No pain today with initiation of treatment. Advanced core strengthening in aquatic setting. Added crunches in suspended supine, instruction on abdominal bracing with cues throughout for constant engagement of core. Pt given verbal cues and demonstration throughout for proper execution of exercises.  She tolerates well with slight increase in discomfort which dissipated after cool down. Goals ongoing. ? ?   ?  ?OBJECTIVE IMPAIRMENTS decreased activity tolerance, decreased endurance, decreased ROM, decreased strength, improper body mechanics, and pain.  ?  ?ACTIVITY LIMITATIONS cleaning, community activity, driving, and yard work.  ?   ?PERSONAL FACTORS Fitness, Time since onset of injury/illness/exacerbation, and 1-2 comorbidities: anxiety and depression  are also affecting patient's functional outcome.  ?  ?  ?GOALS: ?Goals reviewed with

## 2022-01-08 ENCOUNTER — Institutional Professional Consult (permissible substitution): Payer: Medicaid Other | Admitting: Behavioral Health

## 2022-01-08 ENCOUNTER — Telehealth: Payer: Self-pay | Admitting: Behavioral Health

## 2022-01-08 NOTE — Telephone Encounter (Signed)
Tried to reach Pt on her mobile-lft msg about today's [facility] appt via telehealth. Contacted other mobile listed & Gmother ans'd. GM sts Pt is up all night & sleeps during the day. Related to GM Pt will need to [facility] & r/s @ her convenience. GM acknowledged. ? ?Dr. Monna Fam ? ? ?

## 2022-01-09 ENCOUNTER — Telehealth: Payer: Self-pay | Admitting: Gastroenterology

## 2022-01-09 ENCOUNTER — Ambulatory Visit: Payer: Medicaid Other | Attending: Internal Medicine

## 2022-01-09 DIAGNOSIS — M545 Low back pain, unspecified: Secondary | ICD-10-CM | POA: Insufficient documentation

## 2022-01-09 DIAGNOSIS — G8929 Other chronic pain: Secondary | ICD-10-CM | POA: Diagnosis present

## 2022-01-09 DIAGNOSIS — R2689 Other abnormalities of gait and mobility: Secondary | ICD-10-CM | POA: Diagnosis present

## 2022-01-09 DIAGNOSIS — M6281 Muscle weakness (generalized): Secondary | ICD-10-CM | POA: Insufficient documentation

## 2022-01-09 NOTE — Therapy (Signed)
?OUTPATIENT PHYSICAL THERAPY TREATMENT NOTE ? ? ?Patient Name: Kathryn Andrews ?MRN: [id number] ?DOB:[birth year], 26 y.o., female ?Today's Date: 01/09/2022 ? ?PCP: Idamae Schuller, MD ?REFERRING PROVIDER: Lucious Groves, DO ? ? PT End of Session - 01/09/22 1532   ? ? Visit Number 7   ? Number of Visits 17   ? Date for PT Re-Evaluation 02/03/22   ? PT Start Time 1532   ? PT Stop Time 1610   ? PT Time Calculation (min) 38 min   ? Activity Tolerance Patient tolerated treatment well   ? Behavior During Therapy [facility] for tasks assessed/performed   ? ?  ?  ? ?  ? ? ? ? ?Past Medical History:  ?Diagnosis Date  ? Anxiety   ? Asthma   ? Attention deficit hyperactivity disorder   ? Chest pain   ? Chronic constipation   ? Depression   ? Morbid obesity ([place]) 03/02/2020  ? ?Past Surgical History:  ?Procedure Laterality Date  ? adenoid removal    ? BIOPSY  08/12/2021  ? Procedure: BIOPSY;  Surgeon: Irving Copas., MD;  Location: [facility];  Service: Gastroenterology;;  EGD and COLON  ? COLONOSCOPY WITH PROPOFOL N/A 08/12/2021  ? Procedure: COLONOSCOPY WITH PROPOFOL;  Surgeon: Mansouraty, Telford Nab., MD;  Location: [facility];  Service: Gastroenterology;  Laterality: N/A;  ? ESOPHAGOGASTRODUODENOSCOPY (EGD) WITH PROPOFOL N/A 08/12/2021  ? Procedure: ESOPHAGOGASTRODUODENOSCOPY (EGD) WITH PROPOFOL;  Surgeon: Rush Landmark Telford Nab., MD;  Location: [facility];  Service: Gastroenterology;  Laterality: N/A;  ? EXTERNAL EAR SURGERY    ? POLYPECTOMY  08/12/2021  ? Procedure: POLYPECTOMY;  Surgeon: Mansouraty, Telford Nab., MD;  Location: [facility];  Service: Gastroenterology;;  ? WISDOM TOOTH EXTRACTION    ? ?Patient Active Problem List  ? Diagnosis Date Noted  ? Irritable bowel syndrome with diarrhea 12/15/2021  ? Generalized abdominal pain 12/15/2021  ? Class 3 severe obesity without serious comorbidity with body mass index (BMI) of 60.0 to 69.9 in adult [facility]) 12/15/2021  ? Low back strain 11/18/2021  ? Folliculitis 40/07/2724  ?  Chronic diarrhea 03/08/2021  ? Abdominal pain, epigastric 03/08/2021  ? Anxiety and depression 04/02/2020  ? Chronic lower back pain 04/02/2020  ? Urinary frequency 04/02/2020  ? Healthcare maintenance 03/02/2020  ? Morbid obesity ([place]) 03/02/2020  ? Allergic rhinitis 03/02/2020  ? Functional dyspepsia 02/28/2020  ? Chronic constipation   ? ? ?REFERRING DIAG:  ?S39.012A (ICD-10-CM) - Back strain, initial encounter ? ?THERAPY DIAG:  ?Chronic right-sided low back pain, unspecified whether sciatica present ? ?Muscle weakness (generalized) ? ?Other abnormalities of gait and mobility ? ?PERTINENT HISTORY:  ?Anxiety and depression ? ?PRECAUTIONS:  ?None ? ?SUBJECTIVE:  ?Pt presents to PT with reports of no current pain or discomfort. Pt has been compliant with HEP with no adverse effect. Enjoys aquatic therapy therapy. Pt is ready to begin PT at this time,  ? ?Pain: ?Are you having pain? No ?NPRS: 0/10 ?Pain location: lower back ?PAIN TYPE: aching and sharp ?Pain description: intermittent  ?Aggravating factors: ice/heat; crouching ?Relieving factors: standing >5 min, fwd bending, walking ? ? ?OBJECTIVE:  ?   ?PATIENT SURVEYS:  ?Modified Oswestry 48% disability  ?  ?MUSCLE LENGTH: ?Hamstrings: Right WFL deg; Left WFL deg ?  ?POSTURE:  ?Large body habitus; increased lumbar lordosis ?  ?PALPATION: ?TTP to R sided lumbar paraspinals ?  ?LUMBARAROM/PROM ?  ?A/PROM A/PROM  ?12/09/2021  ?Flexion Increased pain with repeated flexion  ?Extension Extension reduced; pain with repated motions  ? (  Blank rows = not tested) ?  ?LE MMT: ?  ?MMT Right ?12/09/2021 Left ?12/09/2021  ?Hip flexion  5/5 5/5  ?Hip extension      ?Hip abduction 4/5 4/5  ?Hip adduction 3+/5 3+/5  ?Hip external rotation      ?Hip internal rotation      ?Knee extension 5/5 5/5  ?Knee flexion 5/5 5/5  ?Ankle dorsiflexion       ?Ankle plantarflexion      ?Ankle inversion      ?Ankle eversion      ?Grossly      ?(Blank rows = not tested) ?  ?LUMBAR SPECIAL TESTS:  ?Straight  leg raise test: Positive Left ?  ?FUNCTIONAL TESTS:  ?30 seconds chair stand test: 7 reps ?  ?GAIT: ?Distance walked: 24f ?Assistive device utilized: None ?Level of assistance: Complete Independence ?Comments: decreased gait speed ?  ?[facility] TREATMENT  [facility] ?Therapeutic Exercise: ?Supine SLR 2x10 each 2# ?STS from elevated table 2x10 - holding 10lb KB ?Paloff press 2x10 10# ?FM deadlift with cross bar 2x10 13# ?Chop at FM x 10 10# each ?Standing hip abd 2x10 30# each ?Leg press 3x10 80# - hips externally rotated  ?KB deadlift 25# from 10in box 3x8 ? [facility] ?Therapeutic Exercise: ?LTR x 10 ?Supine SLR 2x10 each ?Supine clamshell 3x20 BTB ?Supine march 3x20 BTB ?STS from elevated table 2x10 - holding 10lb KB ?Standing hip abd 2x10 30# each ?Leg press 3x10 60# ?KB deadlift 15# from 12in box 2x8 ? [facility] ?Therapeutic Exercise: ?LTR x 10 ?Supine SLR 2x10 each ?Supine clamshell 2x20 BTB ?Supine march 2x20 BTB ?Supine PPT x 10 - 5" hold ?Supine PPT with ball 2x10 - 5" hold ?Seated physioball rollout fwd/lat x 10 each ?STS from elevated table 2x10 - no UE support ?Standing hip abd/ext 2x10 30# each ?KB deadlift 15# from 12in box x 8 ?Therapeutic Activity: ?Assessment of tests/measures, goals, and outcomes for MCD re-authorization  ? ? ?PATIENT EDUCATION:  ?Education details: eval findings, ODI, HEP, POC ?Person educated: Patient ?Education method: Explanation, Demonstration, and Handouts ?Education comprehension: verbalized understanding and returned demonstration ?  ?  ?HOME EXERCISE PROGRAM: ?Access Code: [email or web address]?URL: [email or web address] ?Date: 12/17/2021 ?Prepared by: DOctavio Manns? ?Exercises ?Supine Posterior Pelvic Tilt - 1 x daily - 7 x weekly - 2 sets - 10 reps - 5 sec hold ?Supine Active Straight Leg Raise - 1 x daily - 7 x weekly - 2 sets - 10 reps ?Seated Hip Abduction with Resistance - 1  x daily - 7 x weekly - 3 sets - 20 reps - green tband hold ?Seated Hip Adduction Squeeze with Ball - 1 x daily - 7 x weekly - 3 sets - 10 reps - 5 sec hold ?Sit to Stand - 1 x daily - 7 x weekly - 2 sets - 10 reps ? ?  ?ASSESSMENT: ?  ?CLINICAL IMPRESSION: ?Pt was again able to complete all prescribed exercises with no adverse effect. Therapy today continue to work on improving core and proximal hip strength in order to decrease pain and improve function. Pt has been progressing well with therapy, showing overall improvement in strength and functional activity tolerance. PT will continue to progress exercises as tolerated per POC.  ?  ?OBJECTIVE IMPAIRMENTS decreased activity tolerance, decreased endurance, decreased ROM, decreased strength, improper body mechanics, and pain.  ?  ?ACTIVITY LIMITATIONS cleaning, community activity, driving, and yard work.  ?  ?  PERSONAL FACTORS Fitness, Time since onset of injury/illness/exacerbation, and 1-2 comorbidities: anxiety and depression  are also affecting patient's functional outcome.  ?  ?  ?GOALS: ?Goals reviewed with patient? Yes ?  ?SHORT TERM GOALS: ?  ?Pt will be compliant and knowledgeable with initial HEP for improved comfort and carryover ?Baseline: initial HEP given ?Target date: 12/30/2021 ?Goal status: MET ?  ?2.  Pt will self report low back pain no greater than 6/10 for improved comfort and functional ability ?Baseline: 10/10 at worst ?Target date: 12/30/2021 ?Goal status: MET ?  ?LONG TERM GOALS: ?  ?Pt will self report low back pain no greater than 3/10 for improved comfort and functional ability ?Baseline: 10/10 at worst ?Target date: 02/03/2022 ?Goal status: ONGOING ?  ?2.  Pt will decrease ODI score to no greater than 38% disability as proxy for functional improvement ?Baseline: 50% disability; 12/24/2021 - 48% disability ?Target date: 02/03/2022 ?Goal status: ONGOING ?  ?3.  Pt will be able to lift 50lb via deadlift from with no increase in LBP for improved  functional ability with ADL performance.  ?Baseline: unable ?Target date: 02/03/2022 ?Goal status: ONGOING ?  ?4.  Pt will increase 30 Second Sit to Stand rep count to no less than 10 reps for improved ba

## 2022-01-09 NOTE — Telephone Encounter (Signed)
Patient's grandmother Kathryn Andrews is calling.  Patient was referred to General surgery, they do not accept her insurance.  Patient will need to be referred top [facility] or to [place].  They prefer [facility]- for Lap band surgery ?

## 2022-01-13 NOTE — Therapy (Incomplete)
?OUTPATIENT PHYSICAL THERAPY TREATMENT NOTE ? ? ?Patient Name: Kathryn Andrews ?MRN: [id number] ?DOB:Mar 17, 1996, 26 y.o., female ?Today's Date: 01/13/2022 ? ?PCP: Idamae Schuller, MD ?REFERRING PROVIDER: Lucious Groves, DO ? ? ? ? ? ? ?Past Medical History:  ?Diagnosis Date  ? Anxiety   ? Asthma   ? Attention deficit hyperactivity disorder   ? Chest pain   ? Chronic constipation   ? Depression   ? Morbid obesity ([place]) 03/02/2020  ? ?Past Surgical History:  ?Procedure Laterality Date  ? adenoid removal    ? BIOPSY  08/12/2021  ? Procedure: BIOPSY;  Surgeon: Irving Copas., MD;  Location: [facility];  Service: Gastroenterology;;  EGD and COLON  ? COLONOSCOPY WITH PROPOFOL N/A 08/12/2021  ? Procedure: COLONOSCOPY WITH PROPOFOL;  Surgeon: Mansouraty, Telford Nab., MD;  Location: [facility];  Service: Gastroenterology;  Laterality: N/A;  ? ESOPHAGOGASTRODUODENOSCOPY (EGD) WITH PROPOFOL N/A 08/12/2021  ? Procedure: ESOPHAGOGASTRODUODENOSCOPY (EGD) WITH PROPOFOL;  Surgeon: Rush Landmark Telford Nab., MD;  Location: [facility];  Service: Gastroenterology;  Laterality: N/A;  ? EXTERNAL EAR SURGERY    ? POLYPECTOMY  08/12/2021  ? Procedure: POLYPECTOMY;  Surgeon: Mansouraty, Telford Nab., MD;  Location: [facility];  Service: Gastroenterology;;  ? WISDOM TOOTH EXTRACTION    ? ?Patient Active Problem List  ? Diagnosis Date Noted  ? Irritable bowel syndrome with diarrhea 12/15/2021  ? Generalized abdominal pain 12/15/2021  ? Class 3 severe obesity without serious comorbidity with body mass index (BMI) of 60.0 to 69.9 in adult [facility]) 12/15/2021  ? Low back strain 11/18/2021  ? Folliculitis 30/16/0109  ? Chronic diarrhea 03/08/2021  ? Abdominal pain, epigastric 03/08/2021  ? Anxiety and depression 04/02/2020  ? Chronic lower back pain 04/02/2020  ? Urinary frequency 04/02/2020  ? Healthcare maintenance 03/02/2020  ? Morbid obesity ([place]) 03/02/2020  ? Allergic rhinitis 03/02/2020  ? Functional dyspepsia 02/28/2020  ? Chronic  constipation   ? ? ?REFERRING DIAG:  ?S39.012A (ICD-10-CM) - Back strain, initial encounter ? ?THERAPY DIAG:  ?No diagnosis found. ? ?PERTINENT HISTORY:  ?Anxiety and depression ? ?PRECAUTIONS:  ?None ? ?SUBJECTIVE:  ?Pt presents to PT with reports of no current pain or discomfort. Pt has been compliant with HEP with no adverse effect. Enjoys aquatic therapy therapy. Pt is ready to begin PT at this time,  ? ?Pain: ?Are you having pain? No ?NPRS: 0/10 ?Pain location: lower back ?PAIN TYPE: aching and sharp ?Pain description: intermittent  ?Aggravating factors: ice/heat; crouching ?Relieving factors: standing >5 min, fwd bending, walking ? ? ?OBJECTIVE:  ?   ?PATIENT SURVEYS:  ?Modified Oswestry 48% disability  ?  ?MUSCLE LENGTH: ?Hamstrings: Right WFL deg; Left WFL deg ?  ?POSTURE:  ?Large body habitus; increased lumbar lordosis ?  ?PALPATION: ?TTP to R sided lumbar paraspinals ?  ?LUMBARAROM/PROM ?  ?A/PROM A/PROM  ?12/09/2021  ?Flexion Increased pain with repeated flexion  ?Extension Extension reduced; pain with repated motions  ? (Blank rows = not tested) ?  ?LE MMT: ?  ?MMT Right ?12/09/2021 Left ?12/09/2021  ?Hip flexion  5/5 5/5  ?Hip extension      ?Hip abduction 4/5 4/5  ?Hip adduction 3+/5 3+/5  ?Hip external rotation      ?Hip internal rotation      ?Knee extension 5/5 5/5  ?Knee flexion 5/5 5/5  ?Ankle dorsiflexion       ?Ankle plantarflexion      ?Ankle inversion      ?Ankle eversion      ?Grossly      ?(  Blank rows = not tested) ?  ?LUMBAR SPECIAL TESTS:  ?Straight leg raise test: Positive Left ?  ?FUNCTIONAL TESTS:  ?30 seconds chair stand test: 7 reps ?  ?GAIT: ?Distance walked: 51f ?Assistive device utilized: None ?Level of assistance: Complete Independence ?Comments: decreased gait speed ?  ?TODAY'S TREATMENT  ? [facility]                                                [facility] ? ?[address] pool temp 92 degrees ?Pt enters building  ***Treatment took place in water 3.8 to  4 ft 8 in.feet deep depending upon activity.  Pt entered and exited the pool via stair and handrails *** ?Pt pain level 7/10 at initiation of water walking.  ?Forward/ backward walking for warm up ?4 widths with hand buoys submerged for core engagement ?Seated flutter kicking (SLR) 3x20 ?STS from 3rd (bottom) step x 10. Cues for engaged core ?Add/abd 3x20. Cues for increased speed for increased resistance ?Kick board push pull with core engaged 3x20 ?Resisted trunk rotation with kickboard on side x 10ea  R/L ?Side lunges with yellow hand buoys shoulder add/abd x 4 widths ?Prone suspension with hand buoys for LB and abdominal stretching 2x30s ?Supine suspension with knees to chest x10 ?Cycling on yellow noodle x 5 minutes warm down. ?  ?Pt requires buoyancy for support and to offload joints with strengthening exercises. Viscosity of the water is needed for resistance of strengthening; water current perturbations provides challenge to standing balance unsupported, requiring increased core activation. ? ? [facility] ?Therapeutic Exercise: ?Supine SLR 2x10 each 2# ?STS from elevated table 2x10 - holding 10lb KB ?Paloff press 2x10 10# ?FM deadlift with cross bar 2x10 13# ?Chop at FM x 10 10# each ?Standing hip abd 2x10 30# each ?Leg press 3x10 80# - hips externally rotated  ?KB deadlift 25# from 10in box 3x8 ? ?[facility] ?Therapeutic Exercise: ?LTR x 10 ?Supine SLR 2x10 each ?Supine clamshell 3x20 BTB ?Supine march 3x20 BTB ?STS from elevated table 2x10 - holding 10lb KB ?Standing hip abd 2x10 30# each ?Leg press 3x10 60# ?KB deadlift 15# from 12in box 2x8 ? ?[facility] ?Therapeutic Exercise: ?LTR x 10 ?Supine SLR 2x10 each ?Supine clamshell 2x20 BTB ?Supine march 2x20 BTB ?Supine PPT x 10 - 5" hold ?Supine PPT with ball 2x10 - 5" hold ?Seated physioball rollout fwd/lat x 10 each ?STS from  elevated table 2x10 - no UE support ?Standing hip abd/ext 2x10 30# each ?KB deadlift 15# from 12in box x 8 ?Therapeutic Activity: ?Assessment of tests/measures, goals, and outcomes for [facility] re-authorization  ? ? ?PATIENT EDUCATION:  ?Education details: eval findings, ODI, HEP, POC ?Person educated: Patient ?Education method: Explanation, Demonstration, and Handouts ?Education comprehension: verbalized understanding and returned demonstration ?  ?  ?HOME EXERCISE PROGRAM: ?Access Code: [email or web address]?URL: [email or web address] ?Date: 12/17/2021 ?Prepared by: DOctavio Manns? ?Exercises ?Supine Posterior Pelvic Tilt - 1 x daily - 7 x weekly - 2 sets - 10 reps - 5 sec hold ?Supine Active Straight Leg Raise - 1 x daily - 7 x weekly - 2 sets - 10 reps ?Seated Hip Abduction with Resistance - 1 x daily - 7 x weekly - 3 sets - 20  reps - green tband hold ?Seated Hip Adduction Squeeze with Ball - 1 x daily - 7 x weekly - 3 sets - 10 reps - 5 sec hold ?Sit to Stand - 1 x daily - 7 x weekly - 2 sets - 10 reps ? ?  ?ASSESSMENT: ?  ?CLINICAL IMPRESSION: ?Pt was again able to complete all prescribed exercises with no adverse effect. Therapy today continue to work on improving core and proximal hip strength in order to decrease pain and improve function. Pt has been progressing well with therapy, showing overall improvement in strength and functional activity tolerance. PT will continue to progress exercises as tolerated per POC.  ?  ?OBJECTIVE IMPAIRMENTS decreased activity tolerance, decreased endurance, decreased ROM, decreased strength, improper body mechanics, and pain.  ?  ?ACTIVITY LIMITATIONS cleaning, community activity, driving, and yard work.  ?  ?PERSONAL FACTORS Fitness, Time since onset of injury/illness/exacerbation, and 1-2 comorbidities: anxiety and depression  are also affecting patient's functional outcome.  ?  ?  ?GOALS: ?Goals reviewed with patient? Yes ?  ?SHORT TERM GOALS: ?  ?Pt will be compliant and  knowledgeable with initial HEP for improved comfort and carryover ?Baseline: initial HEP given ?Target date: 12/30/2021 ?Goal status: MET ?  ?2.  Pt will self report low back pain no greater than 6/10 for impro

## 2022-01-13 NOTE — Telephone Encounter (Signed)
Referral to [facility] - has been faxed to Dr. Toney Rakes at [phone]. They will contact patient to schedule.  ?

## 2022-01-13 NOTE — Therapy (Incomplete)
?OUTPATIENT PHYSICAL THERAPY TREATMENT NOTE ? ? ?Patient Name: Kathryn Andrews ?MRN: [id number] ?DOB:Sep 26, 1996, 26 y.o., female ?Today's Date: 01/13/2022 ? ?PCP: Idamae Schuller, MD ?REFERRING PROVIDER: Idamae Schuller, MD ? ? ? ? ? ? ?Past Medical History:  ?Diagnosis Date  ? Anxiety   ? Asthma   ? Attention deficit hyperactivity disorder   ? Chest pain   ? Chronic constipation   ? Depression   ? Morbid obesity ([place]) 03/02/2020  ? ?Past Surgical History:  ?Procedure Laterality Date  ? adenoid removal    ? BIOPSY  08/12/2021  ? Procedure: BIOPSY;  Surgeon: Irving Copas., MD;  Location: [facility];  Service: Gastroenterology;;  EGD and COLON  ? COLONOSCOPY WITH PROPOFOL N/A 08/12/2021  ? Procedure: COLONOSCOPY WITH PROPOFOL;  Surgeon: Mansouraty, Telford Nab., MD;  Location: [facility];  Service: Gastroenterology;  Laterality: N/A;  ? ESOPHAGOGASTRODUODENOSCOPY (EGD) WITH PROPOFOL N/A 08/12/2021  ? Procedure: ESOPHAGOGASTRODUODENOSCOPY (EGD) WITH PROPOFOL;  Surgeon: Rush Landmark Telford Nab., MD;  Location: [facility];  Service: Gastroenterology;  Laterality: N/A;  ? EXTERNAL EAR SURGERY    ? POLYPECTOMY  08/12/2021  ? Procedure: POLYPECTOMY;  Surgeon: Mansouraty, Telford Nab., MD;  Location: [facility];  Service: Gastroenterology;;  ? WISDOM TOOTH EXTRACTION    ? ?Patient Active Problem List  ? Diagnosis Date Noted  ? Irritable bowel syndrome with diarrhea 12/15/2021  ? Generalized abdominal pain 12/15/2021  ? Class 3 severe obesity without serious comorbidity with body mass index (BMI) of 60.0 to 69.9 in adult [facility]) 12/15/2021  ? Low back strain 11/18/2021  ? Folliculitis 07/22/5101  ? Chronic diarrhea 03/08/2021  ? Abdominal pain, epigastric 03/08/2021  ? Anxiety and depression 04/02/2020  ? Chronic lower back pain 04/02/2020  ? Urinary frequency 04/02/2020  ? Healthcare maintenance 03/02/2020  ? Morbid obesity ([place]) 03/02/2020  ? Allergic rhinitis 03/02/2020  ? Functional dyspepsia 02/28/2020  ? Chronic  constipation   ? ? ?REFERRING DIAG:  ?S39.012A (ICD-10-CM) - Back strain, initial encounter ? ?THERAPY DIAG:  ?No diagnosis found. ? ?PERTINENT HISTORY:  ?Anxiety and depression ? ?PRECAUTIONS:  ?None ? ?SUBJECTIVE:  ?*** ? ?Pain: ?Are you having pain? No ?NPRS: 0/10 ?Pain location: lower back ?PAIN TYPE: aching and sharp ?Pain description: intermittent  ?Aggravating factors: ice/heat; crouching ?Relieving factors: standing >5 min, fwd bending, walking ? ? ?OBJECTIVE:  ?   ?PATIENT SURVEYS:  ?Modified Oswestry 48% disability  ?  ?MUSCLE LENGTH: ?Hamstrings: Right WFL deg; Left WFL deg ?  ?POSTURE:  ?Large body habitus; increased lumbar lordosis ?  ?PALPATION: ?TTP to R sided lumbar paraspinals ?  ?LUMBARAROM/PROM ?  ?A/PROM A/PROM  ?12/09/2021  ?Flexion Increased pain with repeated flexion  ?Extension Extension reduced; pain with repated motions  ? (Blank rows = not tested) ?  ?LE MMT: ?  ?MMT Right ?12/09/2021 Left ?12/09/2021  ?Hip flexion  5/5 5/5  ?Hip extension      ?Hip abduction 4/5 4/5  ?Hip adduction 3+/5 3+/5  ?Hip external rotation      ?Hip internal rotation      ?Knee extension 5/5 5/5  ?Knee flexion 5/5 5/5  ?Ankle dorsiflexion       ?Ankle plantarflexion      ?Ankle inversion      ?Ankle eversion      ?Grossly      ?(Blank rows = not tested) ?  ?LUMBAR SPECIAL TESTS:  ?Straight leg raise test: Positive Left ?  ?FUNCTIONAL TESTS:  ?30 seconds chair stand test: 7 reps ?  ?GAIT: ?  Distance walked: 34f ?Assistive device utilized: None ?Level of assistance: Complete Independence ?Comments: decreased gait speed ?  ?TODAY'S TREATMENT  [facility] ?Therapeutic Exercise: ?Supine SLR 2x10 each 2# ?STS from elevated table 2x10 - holding 10lb KB ?Paloff press 2x10 10# ?FM deadlift with cross bar 2x10 13# ?Chop at FM x 10 10# each ?Standing hip abd 2x10 30# each ?Leg press 3x10 80# - hips externally rotated  ?KB deadlift 25# from 10in box 3x8 ? ?[facility]  [facility]20[facility] ?Therapeutic Exercise: ?Supine SLR 2x10 each 2# ?STS from elevated table 2x10 - holding 10lb KB ?Paloff press 2x10 10# ?FM deadlift with cross bar 2x10 13# ?Chop at FM x 10 10# each ?Standing hip abd 2x10 30# each ?Leg press 3x10 80# - hips externally rotated  ?KB deadlift 25# from 10in box 3x8 ? ?[facility]: [facility]/20[facility] ?Therapeutic Exercise: ?LTR x 10 ?Supine SLR 2x10 each ?Supine clamshell 3x20 BTB ?Supine march 3x20 BTB ?STS from elevated table 2x10 - holding 10lb KB ?Standing hip abd 2x10 30# each ?Leg press 3x10 60# ?KB deadlift 15# from 12in box 2x8 ? ?PATIENT EDUCATION:  ?Education details: eval findings, ODI, HEP, POC ?Person educated: Patient ?Education method: Explanation, Demonstration, and Handouts ?Education comprehension: verbalized understanding and returned demonstration ?  ?  ?HOME EXERCISE PROGRAM: ?Access Code: [email or web address]?URL: [email or web address] ?Date: 12/17/2021 ?Prepared by: DOctavio Manns? ?Exercises ?Supine Posterior Pelvic Tilt - 1 x daily - 7 x weekly - 2 sets - 10 reps - 5 sec hold ?Supine Active Straight Leg Raise - 1 x daily - 7 x weekly - 2 sets - 10 reps ?Seated Hip Abduction with Resistance - 1 x daily - 7 x weekly - 3 sets - 20 reps - green tband hold ?Seated Hip Adduction Squeeze with Ball - 1 x daily - 7 x weekly - 3 sets - 10 reps - 5 sec hold ?Sit to Stand - 1 x daily - 7 x weekly - 2 sets - 10 reps ? ?  ?ASSESSMENT: ?  ?CLINICAL IMPRESSION: ?*** ?  ?OBJECTIVE IMPAIRMENTS decreased activity tolerance, decreased endurance, decreased ROM, decreased strength, improper body mechanics, and pain.  ?  ?ACTIVITY LIMITATIONS cleaning, community activity, driving, and yard work.  ?  ?PERSONAL FACTORS Fitness, Time since onset of injury/illness/exacerbation, and 1-2 comorbidities: anxiety and depression  are also affecting patient's functional outcome.  ?  ?  ?GOALS: ?Goals reviewed with patient? Yes ?  ?SHORT TERM GOALS: ?  ?Pt will be compliant and  knowledgeable with initial HEP for improved comfort and carryover ?Baseline: initial HEP given ?Target date: 12/30/2021 ?Goal status: MET ?  ?2.  Pt will self report low back pain no greater than 6/10 for improved comfort and functional ability ?Baseline: 10/10 at worst ?Target date: 12/30/2021 ?Goal status: MET ?  ?LONG TERM GOALS: ?  ?Pt will self report low back pain no greater than 3/10 for improved comfort and functional ability ?Baseline: 10/10 at worst ?Target date: 02/03/2022 ?Goal status: ONGOING ?  ?2.  Pt will decrease ODI score to no greater than 38% disability as proxy for functional improvement ?Baseline: 50% disability; 12/24/2021 - 48% disability ?Target date: 02/03/2022 ?Goal status: ONGOING ?  ?3.  Pt will be able to lift 50lb via deadlift from with no increase in LBP for improved functional ability with ADL performance.  ?Baseline: unable ?Target date: 02/03/2022 ?Goal status: ONGOING ?  ?4.  Pt will increase 30 Second Sit to Stand  rep count to no less than 10 reps for improved balance, strength, and functional mobility ?Baseline: 7 ?Target date: 02/03/2022 ?Goal status: ONGOING ?  ?PLAN: ?PT FREQUENCY: 2x/week ?  ?PT DURATION: 6 weeks ?  ?PLANNED INTERVENTIONS: Therapeutic exercises, Therapeutic activity, Neuromuscular re-education, Balance training, Gait training, Patient/Family education, Joint mobilization, Aquatic Therapy, Dry Needling, Cryotherapy, Moist heat, and Manual therapy ?  ?PLAN FOR NEXT SESSION: assess HEP response; proximal hip and core strengthening ? ? ? ?Ward Chatters, PT ?01/13/2022, 12:14 PM ? ?   ?

## 2022-01-13 NOTE — Telephone Encounter (Signed)
FYI Ro incase you were waiting for an appt with [facility]. ?

## 2022-01-15 ENCOUNTER — Ambulatory Visit: Payer: Medicaid Other | Admitting: Physical Therapy

## 2022-01-15 NOTE — Telephone Encounter (Signed)
Pt called at 3:25 PM after not showing to 3:00PM Aquatic appt.  Pt grandmother and pt on phone with PT.  Grandmother and pt reminded of cancellation /no show policy. Grandmother reported that they thought appt was at 3:30.  PT reminded of [address] [facility] appt on Monday April 17 at 3:30 and gave address and phone number.  ?Pt will need to make appt one at a time due to cancellation/no show policies ? ?Voncille Lo, PT, ATRIC ?Certified Exercise Expert for the Aging Adult  ?01/15/22 3:34 PM ?Phone: [phone] ?Fax: [phone]  ?

## 2022-01-20 ENCOUNTER — Ambulatory Visit: Payer: Medicaid Other

## 2022-01-20 DIAGNOSIS — M545 Low back pain, unspecified: Secondary | ICD-10-CM | POA: Diagnosis not present

## 2022-01-20 DIAGNOSIS — M6281 Muscle weakness (generalized): Secondary | ICD-10-CM

## 2022-01-20 DIAGNOSIS — R2689 Other abnormalities of gait and mobility: Secondary | ICD-10-CM

## 2022-01-20 NOTE — Therapy (Signed)
?OUTPATIENT PHYSICAL THERAPY TREATMENT NOTE ? ? ?Patient Name: Kathryn Andrews ?MRN: [id number] ?DOB:19-Sep-1996, 26 y.o., female ?Today's Date: 01/20/2022 ? ?PCP: Idamae Schuller, MD ?REFERRING PROVIDER: Idamae Schuller, MD ? ? PT End of Session - 01/20/22 1531   ? ? Visit Number 8   ? Number of Visits 17   ? Date for PT Re-Evaluation 02/03/22   ? PT Start Time 6606   ? PT Stop Time 1601   session ended early as patient had to catch the bus  ? PT Time Calculation (min) 30 min   ? Activity Tolerance Patient tolerated treatment well   ? Behavior During Therapy [facility] for tasks assessed/performed   ? ?  ?  ? ?  ? ? ? ? ? ?Past Medical History:  ?Diagnosis Date  ? Anxiety   ? Asthma   ? Attention deficit hyperactivity disorder   ? Chest pain   ? Chronic constipation   ? Depression   ? Morbid obesity ([place]) 03/02/2020  ? ?Past Surgical History:  ?Procedure Laterality Date  ? adenoid removal    ? BIOPSY  08/12/2021  ? Procedure: BIOPSY;  Surgeon: Irving Copas., MD;  Location: [facility];  Service: Gastroenterology;;  EGD and COLON  ? COLONOSCOPY WITH PROPOFOL N/A 08/12/2021  ? Procedure: COLONOSCOPY WITH PROPOFOL;  Surgeon: Mansouraty, Telford Nab., MD;  Location: [facility];  Service: Gastroenterology;  Laterality: N/A;  ? ESOPHAGOGASTRODUODENOSCOPY (EGD) WITH PROPOFOL N/A 08/12/2021  ? Procedure: ESOPHAGOGASTRODUODENOSCOPY (EGD) WITH PROPOFOL;  Surgeon: Rush Landmark Telford Nab., MD;  Location: [facility];  Service: Gastroenterology;  Laterality: N/A;  ? EXTERNAL EAR SURGERY    ? POLYPECTOMY  08/12/2021  ? Procedure: POLYPECTOMY;  Surgeon: Mansouraty, Telford Nab., MD;  Location: [facility];  Service: Gastroenterology;;  ? WISDOM TOOTH EXTRACTION    ? ?Patient Active Problem List  ? Diagnosis Date Noted  ? Irritable bowel syndrome with diarrhea 12/15/2021  ? Generalized abdominal pain 12/15/2021  ? Class 3 severe obesity without serious comorbidity with body mass index (BMI) of 60.0 to 69.9 in adult [facility]) 12/15/2021  ? Low  back strain 11/18/2021  ? Folliculitis 00/45/9977  ? Chronic diarrhea 03/08/2021  ? Abdominal pain, epigastric 03/08/2021  ? Anxiety and depression 04/02/2020  ? Chronic lower back pain 04/02/2020  ? Urinary frequency 04/02/2020  ? Healthcare maintenance 03/02/2020  ? Morbid obesity ([place]) 03/02/2020  ? Allergic rhinitis 03/02/2020  ? Functional dyspepsia 02/28/2020  ? Chronic constipation   ? ? ?REFERRING DIAG:  ?S39.012A (ICD-10-CM) - Back strain, initial encounter ? ?THERAPY DIAG:  ?Chronic right-sided low back pain, unspecified whether sciatica present ? ?Muscle weakness (generalized) ? ?Other abnormalities of gait and mobility ? ?PERTINENT HISTORY:  ?Anxiety and depression ? ?PRECAUTIONS:  ?None ? ?SUBJECTIVE:  ?Pt presents to PT with no current LBP. Has continued HEP compliance. Pt is ready to begin PT at this time.  ? ?Pain: ?Are you having pain? No ?NPRS: 0/10 ?Pain location: lower back ?PAIN TYPE: aching and sharp ?Pain description: intermittent  ?Aggravating factors: ice/heat; crouching ?Relieving factors: standing >5 min, fwd bending, walking ? ? ?OBJECTIVE:  ?   ?PATIENT SURVEYS:  ?Modified Oswestry 48% disability  ?  ?MUSCLE LENGTH: ?Hamstrings: Right WFL deg; Left WFL deg ?  ?POSTURE:  ?Large body habitus; increased lumbar lordosis ?  ?PALPATION: ?TTP to R sided lumbar paraspinals ?  ?LUMBARAROM/PROM ?  ?A/PROM A/PROM  ?12/09/2021  ?Flexion Increased pain with repeated flexion  ?Extension Extension reduced; pain with repated motions  ? (Blank rows =  not tested) ?  ?LE MMT: ?  ?MMT Right ?12/09/2021 Left ?12/09/2021  ?Hip flexion  5/5 5/5  ?Hip extension      ?Hip abduction 4/5 4/5  ?Hip adduction 3+/5 3+/5  ?Hip external rotation      ?Hip internal rotation      ?Knee extension 5/5 5/5  ?Knee flexion 5/5 5/5  ?Ankle dorsiflexion       ?Ankle plantarflexion      ?Ankle inversion      ?Ankle eversion      ?Grossly      ?(Blank rows = not tested) ?  ?LUMBAR SPECIAL TESTS:  ?Straight leg raise test: Positive  Left ?  ?FUNCTIONAL TESTS:  ?30 seconds chair stand test: 7 reps ?  ?GAIT: ?Distance walked: 30f ?Assistive device utilized: None ?Level of assistance: Complete Independence ?Comments: decreased gait speed ?  ?TODAY'S TREATMENT  [facility] ?Therapeutic Exercise: ?Supine SLR 2x10 each 2# ?STS from elevated table 2x10 - holding 10lb KB ?Paloff press 2x10 10# ?FM deadlift with cross bar 2x10 13# ?Chop at FM x 10 10# each ?Standing hip abd 2x10 37.5# each ? ?[facility] ?Therapeutic Exercise: ?Supine SLR 2x10 each 2# ?STS from elevated table 2x10 - holding 10lb KB ?Paloff press 2x10 10# ?FM deadlift with cross bar 2x10 13# ?Chop at FM x 10 10# each ?Standing hip abd 2x10 30# each ?Leg press 3x10 80# - hips externally rotated  ?KB deadlift 25# from 10in box 3x8 ? ?[facility]: 01/09/2022 ?Therapeutic Exercise: ?Supine SLR 2x10 each 2# ?STS from elevated table 2x10 - holding 10lb KB ?Paloff press 2x10 10# ?FM deadlift with cross bar 2x10 13# ?Chop at FM x 10 10# each ?Standing hip abd 2x10 30# each ?Leg press 3x10 80# - hips externally rotated  ?KB deadlift 25# from 10in box 3x8 ? ?PATIENT EDUCATION:  ?Education details: eval findings, ODI, HEP, POC ?Person educated: Patient ?Education method: Explanation, Demonstration, and Handouts ?Education comprehension: verbalized understanding and returned demonstration ?  ?  ?HOME EXERCISE PROGRAM: ?Access Code: [email or web address]?URL: [email or web address] ?Date: 12/17/2021 ?Prepared by: DOctavio Manns? ?Exercises ?Supine Posterior Pelvic Tilt - 1 x daily - 7 x weekly - 2 sets - 10 reps - 5 sec hold ?Supine Active Straight Leg Raise - 1 x daily - 7 x weekly - 2 sets - 10 reps ?Seated Hip Abduction with Resistance - 1 x daily - 7 x weekly - 3 sets - 20 reps - green tband hold ?Seated Hip Adduction Squeeze with Ball - 1 x daily - 7 x weekly - 3 sets - 10 reps - 5 sec hold ?Sit to Stand - 1 x daily - 7  x weekly - 2 sets - 10 reps ? ?  ?ASSESSMENT: ?  ?CLINICAL IMPRESSION: ?Pt was able to complete all prescribed exercises with no adverse effect. Therapy today continued to focus on improving core and proximal hip strength in order to decrease pain and improve function. She continues to benefit from skilled PT and will continue to be seen and progressed as able.  ?  ?OBJECTIVE IMPAIRMENTS decreased activity tolerance, decreased endurance, decreased ROM, decreased strength, improper body mechanics, and pain.  ?  ?ACTIVITY LIMITATIONS cleaning, community activity, driving, and yard work.  ?  ?PERSONAL FACTORS Fitness, Time since onset of injury/illness/exacerbation, and 1-2 comorbidities: anxiety and depression  are also affecting patient's functional outcome.  ?  ?  ?GOALS: ?Goals reviewed with patient? Yes ?  ?  SHORT TERM GOALS: ?  ?Pt will be compliant and knowledgeable with initial HEP for improved comfort and carryover ?Baseline: initial HEP given ?Target date: 12/30/2021 ?Goal status: MET ?  ?2.  Pt will self report low back pain no greater than 6/10 for improved comfort and functional ability ?Baseline: 10/10 at worst ?Target date: 12/30/2021 ?Goal status: MET ?  ?LONG TERM GOALS: ?  ?Pt will self report low back pain no greater than 3/10 for improved comfort and functional ability ?Baseline: 10/10 at worst ?Target date: 02/03/2022 ?Goal status: ONGOING ?  ?2.  Pt will decrease ODI score to no greater than 38% disability as proxy for functional improvement ?Baseline: 50% disability; 12/24/2021 - 48% disability ?Target date: 02/03/2022 ?Goal status: ONGOING ?  ?3.  Pt will be able to lift 50lb via deadlift from with no increase in LBP for improved functional ability with ADL performance.  ?Baseline: unable ?Target date: 02/03/2022 ?Goal status: ONGOING ?  ?4.  Pt will increase 30 Second Sit to Stand rep count to no less than 10 reps for improved balance, strength, and functional mobility ?Baseline: 7 ?Target date:  02/03/2022 ?Goal status: ONGOING ?  ?PLAN: ?PT FREQUENCY: 2x/week ?  ?PT DURATION: 6 weeks ?  ?PLANNED INTERVENTIONS: Therapeutic exercises, Therapeutic activity, Neuromuscular re-education, Balance training, Gait

## 2022-01-22 ENCOUNTER — Ambulatory Visit: Payer: Medicaid Other

## 2022-01-28 ENCOUNTER — Ambulatory Visit (INDEPENDENT_AMBULATORY_CARE_PROVIDER_SITE_OTHER): Payer: Medicaid Other | Admitting: Gastroenterology

## 2022-01-28 ENCOUNTER — Encounter: Payer: Self-pay | Admitting: Gastroenterology

## 2022-01-28 VITALS — BP 100/80 | HR 80 | Ht 68.0 in | Wt >= 6400 oz

## 2022-01-28 DIAGNOSIS — K582 Mixed irritable bowel syndrome: Secondary | ICD-10-CM | POA: Diagnosis not present

## 2022-01-28 DIAGNOSIS — R140 Abdominal distension (gaseous): Secondary | ICD-10-CM

## 2022-01-28 DIAGNOSIS — R197 Diarrhea, unspecified: Secondary | ICD-10-CM

## 2022-01-28 DIAGNOSIS — R198 Other specified symptoms and signs involving the digestive system and abdomen: Secondary | ICD-10-CM

## 2022-01-28 NOTE — Progress Notes (Signed)
? ?GASTROENTEROLOGY OUTPATIENT CLINIC VISIT  ? ?Primary Care Provider ?Gwenevere Abbot, MD ?[address] ?[place] [address] ?[phone] ? ?Patient Profile: ?Kathryn Andrews is a 26 y.o. female with a pmh significant for morbid obesity, asthma, MDD/anxiety, ADHD, ?IBS.  The patient presents to the [facility] for an evaluation and management of problem(s) noted below: ? ?Problem List ?1. Bloating   ?2. Irritable bowel syndrome with both constipation and diarrhea   ?3. Morbid obesity ([facility])   ? ? ? ?History of Present Illness ?Please see prior notes for full details of HPI. ? ?Interval History ?The patient returns for follow-up.  She is unaccompanied today.  She states that she did not pick up the Xifaxan that we had ordered her last week.  Her symptoms of diarrhea persist between 2 and 5 times per day.  This does not keep her from doing her activities of daily living.  She did run out of her other medications however and has not called her primary care provider about this yet.  She denies any blood in her stools.  She has not had any significant weight loss.  She has not heard from the bariatric surgery group that we have referred her to.   ? ?GI Review of Systems ?Positive as above including bloating at times ?Negative for dysphagia, odynophagia, melena, hematochezia ? ?Review of Systems ?General: Denies fevers/chills/weight loss unintentionally ?Cardiovascular: Denies chest pain ?Pulmonary: Denies shortness of breath ?Gastroenterological: See HPI ?Genitourinary: Denies darkened urine ?Hematological: Denies easy bruising/bleeding ?Dermatological: Denies jaundice ?Psychological: Mood is stable per her report ? ? ?Medications ?Current Outpatient Medications  ?Medication Sig Dispense Refill  ? DULoxetine (CYMBALTA) 30 MG capsule Take 2 capsules (60 mg total) by mouth daily. 60 capsule 3  ? esomeprazole (NEXIUM) 40 MG capsule Take 1 capsule (40 mg total) by mouth daily before breakfast.  (Patient not taking: Reported on 01/28/2022) 30 capsule 6  ? hydrOXYzine (ATARAX) 10 MG tablet Take 1-2 tabs as needed for anxious feeling, up to three times a day This can cause sleepiness and dry mouth (Patient not taking: Reported on 01/28/2022) 30 tablet 3  ? ibuprofen (ADVIL) 400 MG tablet TAKE 1 TABLET (400 MG TOTAL) BY MOUTH 2 (TWO) TIMES DAILY AS NEEDED FOR MODERATE PAIN. 30 tablet 0  ? loratadine (CLARITIN) 10 MG tablet TAKE 1 TABLET BY MOUTH EVERY DAY 90 tablet 0  ? rifaximin (XIFAXAN) 550 MG TABS tablet Take 1 tablet (550 mg total) by mouth 3 (three) times daily. (Patient not taking: Reported on 01/28/2022) 42 tablet 0  ? VENTOLIN HFA 108 (90 Base) MCG/ACT inhaler TAKE 2 PUFFS BY MOUTH EVERY 6 HOURS AS NEEDED FOR WHEEZE OR SHORTNESS OF BREATH 18 each 2  ? ?No current facility-administered medications for this visit.  ? ? ?Allergies ?No Known Allergies ? ?Histories ?Past Medical History:  ?Diagnosis Date  ? Anxiety   ? Asthma   ? Attention deficit hyperactivity disorder   ? Chest pain   ? Chronic constipation   ? Depression   ? Morbid obesity ([facility]) 03/02/2020  ? ?Past Surgical History:  ?Procedure Laterality Date  ? adenoid removal    ? BIOPSY  08/12/2021  ? Procedure: BIOPSY;  Surgeon: Lemar Lofty., MD;  Location: [facility];  Service: Gastroenterology;;  EGD and COLON  ? COLONOSCOPY WITH PROPOFOL N/A 08/12/2021  ? Procedure: COLONOSCOPY WITH PROPOFOL;  Surgeon: Mansouraty, Netty Starring., MD;  Location: [facility];  Service: Gastroenterology;  Laterality: N/A;  ? ESOPHAGOGASTRODUODENOSCOPY (EGD) WITH  PROPOFOL N/A 08/12/2021  ? Procedure: ESOPHAGOGASTRODUODENOSCOPY (EGD) WITH PROPOFOL;  Surgeon: Meridee Score Netty Starring., MD;  Location: [facility];  Service: Gastroenterology;  Laterality: N/A;  ? EXTERNAL EAR SURGERY    ? POLYPECTOMY  08/12/2021  ? Procedure: POLYPECTOMY;  Surgeon: Mansouraty, Netty Starring., MD;  Location: [facility];  Service: Gastroenterology;;  ? WISDOM TOOTH EXTRACTION     ? ?Social History  ? ?Socioeconomic History  ? Marital status: Single  ?  Spouse name: Not on file  ? Number of children: Not on file  ? Years of education: Not on file  ? Highest education level: Not on file  ?Occupational History  ? Not on file  ?Tobacco Use  ? Smoking status: Every Day  ?  Types: E-cigarettes  ?  Start date: 02/28/2019  ? Smokeless tobacco: Never  ? Tobacco comments:  ?  Vapes   ?Substance and Sexual Activity  ? Alcohol use: Yes  ?  Comment: occasionally  ? Drug use: Yes  ?  Types: Marijuana  ? Sexual activity: Not on file  ?Other Topics Concern  ? Not on file  ?Social History Narrative  ? Not on file  ? ?Social Determinants of Health  ? ?Financial Resource Strain: Not on file  ?Food Insecurity: Not on file  ?Transportation Needs: Not on file  ?Physical Activity: Not on file  ?Stress: Not on file  ?Social Connections: Not on file  ?Intimate Partner Violence: Not on file  ? ?Family History  ?Problem Relation Age of Onset  ? Diabetes Paternal Grandmother   ? CAD Paternal Grandmother   ? Hypertension Paternal Grandmother   ? Hyperlipidemia Paternal Grandmother   ? Colon cancer Neg Hx   ? Esophageal cancer Neg Hx   ? Inflammatory bowel disease Neg Hx   ? Liver disease Neg Hx   ? Pancreatic cancer Neg Hx   ? Rectal cancer Neg Hx   ? Stomach cancer Neg Hx   ? ?I have reviewed her medical, social, and family history in detail and updated the electronic medical record as necessary.  ? ? ?PHYSICAL EXAMINATION  ?BP 100/80   Pulse 80   Ht 5\' 8"  (1.727 m)   Wt (!) 416 lb (188.7 kg)   LMP  (LMP Unknown)   BMI 63.25 kg/m?  ?Wt Readings from Last 3 Encounters:  ?01/28/22 (!) 416 lb (188.7 kg)  ?12/10/21 (!) 407 lb 2 oz (184.7 kg)  ?11/18/21 (!) 420 lb 8 oz (190.7 kg)  ?GEN: NAD, appears stated age, doesn't appear chronically ill ?PSYCH: Cooperative, without pressured speech ?EYE: Conjunctivae pink, sclerae anicteric ?ENT: MMM ?CV: Nontachycardic ?RESP: No audible wheezing ?GI: Obese, rounded, no  rebound ?MSK/EXT: Slight lower extremity edema present ?SKIN: No jaundice ?NEURO:  Alert & Oriented x 3, no focal deficits ? ? ?REVIEW OF DATA  ?I reviewed the following data at the time of this encounter: ? ?GI Procedures and Studies  ?No new studies to review ? ?Laboratory Studies  ?Reviewed those in epic ? ?Imaging Studies  ?No relevant studies to review ? ? ?ASSESSMENT  ?Ms. Ogden is a 26 y.o. female with a pmh significant for morbid obesity, asthma, MDD/anxiety, ADHD, ?IBS.  The patient is seen today for evaluation and management of: ? ?1. Bloating   ?2. Irritable bowel syndrome with both constipation and diarrhea   ?3. Morbid obesity (HCC)   ? ?The patient is hemodynamically stable.  Clinically she continues to experience issues of alternating bowel habits though mostly diarrhea.  She  never picked up her Xifaxan treatment, and we have confirmed that with the pharmacy.  She will pick that up today.  SIBO breath testing will be considered in the near future depending on how she does with Xifaxan.  We found that they did try to call her and actually she did not appear at her scheduled bariatric surgery visit.  We will give the number to the patient so she can call and try to reschedule her bariatric referral.  All patient questions were answered to the best of my ability, and the patient agrees to the aforementioned plan of action with follow-up as indicated. ? ? ?PLAN  ?Xifaxan 550 mg 3 times daily x14 days for IBS-D treatment ?Consider SIBO breath testing in future ?We will consider Lomotil use in future ?Continue PPI once daily ?Patient given bariatric surgery phone number to call and reschedule ? ? ?No orders of the defined types were placed in this encounter. ? ? ?New Prescriptions  ? No medications on file  ? ?Modified Medications  ? Modified Medication Previous Medication  ? IBUPROFEN (ADVIL) 400 MG TABLET ibuprofen (ADVIL) 400 MG tablet  ?    TAKE 1 TABLET (400 MG TOTAL) BY MOUTH 2 (TWO) TIMES DAILY AS  NEEDED FOR MODERATE PAIN.    Take 1 tablet (400 mg total) by mouth 2 (two) times daily as needed for moderate pain.  ? LORATADINE (CLARITIN) 10 MG TABLET loratadine (CLARITIN) 10 MG tablet  ?    TAKE 1 TABLET BY MOUTH EVERY DAY

## 2022-01-28 NOTE — Patient Instructions (Addendum)
Please contact [facility] - Dr.Fuzz Lily Peer at [phone] regarding referral for Bariatric Surgery Evaluation.  ? ?Try Low- [place] diet.  ? ?Pharmacy will contact you when Xifaxan is ready to be picked up.  ? ?Follow up in 2 months.  ? ?Thank you for choosing me and [facility]. ? ?Dr. Meridee Score ? ?

## 2022-01-29 ENCOUNTER — Other Ambulatory Visit: Payer: Self-pay | Admitting: Student

## 2022-01-29 ENCOUNTER — Other Ambulatory Visit: Payer: Self-pay | Admitting: Internal Medicine

## 2022-01-29 ENCOUNTER — Ambulatory Visit: Payer: Medicaid Other

## 2022-01-29 DIAGNOSIS — J309 Allergic rhinitis, unspecified: Secondary | ICD-10-CM

## 2022-02-01 ENCOUNTER — Encounter: Payer: Self-pay | Admitting: Gastroenterology

## 2022-02-01 DIAGNOSIS — K582 Mixed irritable bowel syndrome: Secondary | ICD-10-CM | POA: Insufficient documentation

## 2022-02-05 ENCOUNTER — Ambulatory Visit: Payer: Medicaid Other

## 2022-02-13 ENCOUNTER — Ambulatory Visit: Payer: Medicaid Other | Attending: Internal Medicine

## 2022-02-13 DIAGNOSIS — R2689 Other abnormalities of gait and mobility: Secondary | ICD-10-CM | POA: Diagnosis present

## 2022-02-13 DIAGNOSIS — M545 Low back pain, unspecified: Secondary | ICD-10-CM | POA: Insufficient documentation

## 2022-02-13 DIAGNOSIS — M6281 Muscle weakness (generalized): Secondary | ICD-10-CM | POA: Insufficient documentation

## 2022-02-13 DIAGNOSIS — G8929 Other chronic pain: Secondary | ICD-10-CM | POA: Diagnosis present

## 2022-02-13 NOTE — Therapy (Signed)
?OUTPATIENT PHYSICAL THERAPY TREATMENT NOTE/RE-CERTIFICATION ? ? ?Patient Name: Kathryn Andrews ?MRN: [id number] ?DOB:[birth year], 26 y.o., female ?Today's Date: 02/13/2022 ? ?PCP: Idamae Schuller, MD ?REFERRING PROVIDER: Lucious Groves, DO ? ? PT End of Session - 02/13/22 1148   ? ? Visit Number 9   ? Number of Visits 17   ? Date for PT Re-Evaluation 03/27/22   ? PT Start Time 1115   ? PT Stop Time 1155   ? PT Time Calculation (min) 40 min   ? Activity Tolerance Patient tolerated treatment well   ? Behavior During Therapy [facility] for tasks assessed/performed   ? ?  ?  ? ?  ? ? ? ? ? ? ?Past Medical History:  ?Diagnosis Date  ? Anxiety   ? Asthma   ? Attention deficit hyperactivity disorder   ? Chest pain   ? Chronic constipation   ? Depression   ? Morbid obesity ([place]) 03/02/2020  ? ?Past Surgical History:  ?Procedure Laterality Date  ? adenoid removal    ? BIOPSY  08/12/2021  ? Procedure: BIOPSY;  Surgeon: Irving Copas., MD;  Location: [facility];  Service: Gastroenterology;;  EGD and COLON  ? COLONOSCOPY WITH PROPOFOL N/A 08/12/2021  ? Procedure: COLONOSCOPY WITH PROPOFOL;  Surgeon: Mansouraty, Telford Nab., MD;  Location: [facility];  Service: Gastroenterology;  Laterality: N/A;  ? ESOPHAGOGASTRODUODENOSCOPY (EGD) WITH PROPOFOL N/A 08/12/2021  ? Procedure: ESOPHAGOGASTRODUODENOSCOPY (EGD) WITH PROPOFOL;  Surgeon: Rush Landmark Telford Nab., MD;  Location: [facility];  Service: Gastroenterology;  Laterality: N/A;  ? EXTERNAL EAR SURGERY    ? POLYPECTOMY  08/12/2021  ? Procedure: POLYPECTOMY;  Surgeon: Mansouraty, Telford Nab., MD;  Location: [facility];  Service: Gastroenterology;;  ? WISDOM TOOTH EXTRACTION    ? ?Patient Active Problem List  ? Diagnosis Date Noted  ? Irritable bowel syndrome with both constipation and diarrhea 02/01/2022  ? Irritable bowel syndrome with diarrhea 12/15/2021  ? Generalized abdominal pain 12/15/2021  ? Class 3 severe obesity without serious comorbidity with body mass index (BMI) of  60.0 to 69.9 in adult [facility]) 12/15/2021  ? Low back strain 11/18/2021  ? Folliculitis 01/60/1093  ? Chronic diarrhea 03/08/2021  ? Abdominal pain, epigastric 03/08/2021  ? Bloating 03/08/2021  ? Anxiety and depression 04/02/2020  ? Chronic lower back pain 04/02/2020  ? Urinary frequency 04/02/2020  ? Healthcare maintenance 03/02/2020  ? Morbid obesity ([place]) 03/02/2020  ? Allergic rhinitis 03/02/2020  ? Functional dyspepsia 02/28/2020  ? Chronic constipation   ? ? ?REFERRING DIAG:  ?S39.012A (ICD-10-CM) - Back strain, initial encounter ? ?THERAPY DIAG:  ?Chronic right-sided low back pain, unspecified whether sciatica present ? ?Muscle weakness (generalized) ? ?Other abnormalities of gait and mobility ? ?PERTINENT HISTORY:  ?Anxiety and depression ? ?PRECAUTIONS:  ?None ? ?SUBJECTIVE:  ?Pt presents to PT with reports of continued lower back pain and discomfort with activity. Has been compliant fairly compliant with HEP but continues to have increased pain with activity such as standing with shopping. She is ready to begin PT at this time.  ? ?Pain: ?Are you having pain?  ?No: NPRS: 0/10 (10/10 at worst) 02/13/2022) ?Pain location: lower back ?PAIN TYPE: aching and sharp ?Pain description: intermittent  ?Aggravating factors: ice/heat; crouching ?Relieving factors: standing >5 min, fwd bending, walking ? ? ?OBJECTIVE:  ?   ?PATIENT SURVEYS:  ?Modified Oswestry: 42% disability - 02/13/2022 ?  ?MUSCLE LENGTH: ?Hamstrings: Right WFL deg; Left WFL deg ?  ?POSTURE:  ?Large body habitus; increased lumbar lordosis ?  ?  PALPATION: ?TTP to R sided lumbar paraspinals ?  ?LUMBARAROM/PROM ?  ?A/PROM A/PROM  ?12/09/2021  ?Flexion Increased pain with repeated flexion  ?Extension Extension reduced; pain with repated motions  ? (Blank rows = not tested) ?  ?LE MMT: ?  ?MMT Right ?12/09/2021 Left ?12/09/2021  ?Hip flexion  5/5 5/5  ?Hip extension      ?Hip abduction 4/5 4/5  ?Hip adduction 3+/5 3+/5  ?Hip external rotation      ?Hip internal  rotation      ?Knee extension 5/5 5/5  ?Knee flexion 5/5 5/5  ?Ankle dorsiflexion       ?Ankle plantarflexion      ?Ankle inversion      ?Ankle eversion      ?Grossly      ?(Blank rows = not tested) ?  ?LUMBAR SPECIAL TESTS:  ?Straight leg raise test: Positive Left ?  ?FUNCTIONAL TESTS:  ?30 seconds chair stand test: 7 reps ?  ?GAIT: ?Distance walked: 32f ?Assistive device utilized: None ?Level of assistance: Complete Independence ?Comments: decreased gait speed ?  ?[facility]'S TREATMENT  [facility] ?Therapeutic Exercise: ?Standing hip abd/ext 2x10 each 50# ?Hex bar deadlift 70# x 8 - (hand discomfort) ?Chop x 10 10# ?Lift 7# x 10 each ?Paloff press 2x10 10# ?FM deadlift with cross bar 2x10 20# ?Therapeutic Activity: ?Assessment of tests/measures, goals, and outcomes for re-evaluation/re-cert ? ?[facility] ?Therapeutic Exercise: ?Supine SLR 2x10 each 2# ?STS from elevated table 2x10 - holding 10lb KB ?Paloff press 2x10 10# ?FM deadlift with cross bar 2x10 13# ?Chop at FM x 10 10# each ?Standing hip abd 2x10 37.5# each ? ?[facility] ?Therapeutic Exercise: ?Supine SLR 2x10 each 2# ?STS from elevated table 2x10 - holding 10lb KB ?Paloff press 2x10 10# ?FM deadlift with cross bar 2x10 13# ?Chop at FM x 10 10# each ?Standing hip abd 2x10 30# each ?Leg press 3x10 80# - hips externally rotated  ?KB deadlift 25# from 10in box 3x8 ? ?PATIENT EDUCATION:  ?Education details: ODI, HEP, POC ?Person educated: Patient ?Education method: Explanation, Demonstration, and Handouts ?Education comprehension: verbalized understanding and returned demonstration ?  ?  ?HOME EXERCISE PROGRAM: ?Access Code: [email or web address]?URL: [email or web address] ?Date: 12/17/2021 ?Prepared by: DOctavio Manns? ?Exercises ?Supine Posterior Pelvic Tilt - 1 x daily - 7 x weekly - 2 sets - 10 reps - 5 sec hold ?Supine Active Straight Leg Raise - 1 x daily - 7 x  weekly - 2 sets - 10 reps ?Seated Hip Abduction with Resistance - 1 x daily - 7 x weekly - 3 sets - 20 reps - green tband hold ?Seated Hip Adduction Squeeze with Ball - 1 x daily - 7 x weekly - 3 sets - 10 reps - 5 sec hold ?Sit to Stand - 1 x daily - 7 x weekly - 2 sets - 10 reps ? ?  ?ASSESSMENT: ?  ?CLINICAL IMPRESSION: ?Pt was able to complete prescribed exercises with no adverse effect. Over the course of PT treatment she has continued to make good progress towards meeting goals and decreasing back pain. Her Oswestry score continues to improve and she notes that her pain at best has decreased to 0/10, however she still has instances where it increases sharply when she stands for a prolonged period of time. Pt continues to benefit from skilled PT services in order to keep progressing core and proximal hip strength to decrease back pain. PT  will continue to alternate between gym and aquatic sessions for improving functional abililty. ?  ?OBJECTIVE IMPAIRMENTS decreased activity tolerance, decreased endurance, decreased ROM, decreased strength, improper body mechanics, and pain.  ?  ?ACTIVITY LIMITATIONS cleaning, community activity, driving, and yard work.  ?  ?PERSONAL FACTORS Fitness, Time since onset of injury/illness/exacerbation, and 1-2 comorbidities: anxiety and depression  are also affecting patient's functional outcome.  ?  ?  ?GOALS: ?Goals reviewed with patient? Yes ?  ?SHORT TERM GOALS: ?  ?Pt will be compliant and knowledgeable with initial HEP for improved comfort and carryover ?Baseline: initial HEP given ?Target date: 12/30/2021 ?Goal status: MET ?  ?2.  Pt will self report low back pain no greater than 6/10 for improved comfort and functional ability ?Baseline: 10/10 at worst ?Target date: 12/30/2021 ?Goal status: MET ?  ?LONG TERM GOALS: ?  ?Pt will self report low back pain no greater than 3/10 for improved comfort and functional ability ?Baseline: 10/10 at worst ?Target date: 03/27/2022 ?Goal  status: ONGOING ?  ?2.  Pt will decrease ODI score to no greater than 38% disability as proxy for functional improvement ?Baseline: 50% disability  ?12/24/2021 - 48% disability ?02/13/2022: 42% disability ?Target da

## 2022-02-25 ENCOUNTER — Ambulatory Visit: Payer: Medicaid Other

## 2022-02-25 DIAGNOSIS — M6281 Muscle weakness (generalized): Secondary | ICD-10-CM

## 2022-02-25 DIAGNOSIS — M545 Low back pain, unspecified: Secondary | ICD-10-CM | POA: Diagnosis not present

## 2022-02-25 DIAGNOSIS — R2689 Other abnormalities of gait and mobility: Secondary | ICD-10-CM

## 2022-02-25 NOTE — Therapy (Signed)
OUTPATIENT PHYSICAL THERAPY TREATMENT NOTE   Patient Name: Kathryn Andrews MRN: [id number] DOB:[birth year], 26 y.o., female Today's Date: 02/25/2022  PCP: Idamae Schuller, MD REFERRING PROVIDER: Lucious Groves, DO   PT End of Session - 02/25/22 1532     Visit Number 10    Number of Visits 17    Date for PT Re-Evaluation 03/27/22    PT Start Time 0330    PT Stop Time 0415    PT Time Calculation (min) 45 min    Activity Tolerance Patient tolerated treatment well    Behavior During Therapy [facility] for tasks assessed/performed                  Past Medical History:  Diagnosis Date   Anxiety    Asthma    Attention deficit hyperactivity disorder    Chest pain    Chronic constipation    Depression    Morbid obesity ([place]) 03/02/2020   Past Surgical History:  Procedure Laterality Date   adenoid removal     BIOPSY  08/12/2021   Procedure: BIOPSY;  Surgeon: Irving Copas., MD;  Location: [facility];  Service: Gastroenterology;;  EGD and COLON   COLONOSCOPY WITH PROPOFOL N/A 08/12/2021   Procedure: COLONOSCOPY WITH PROPOFOL;  Surgeon: Irving Copas., MD;  Location: [facility];  Service: Gastroenterology;  Laterality: N/A;   ESOPHAGOGASTRODUODENOSCOPY (EGD) WITH PROPOFOL N/A 08/12/2021   Procedure: ESOPHAGOGASTRODUODENOSCOPY (EGD) WITH PROPOFOL;  Surgeon: Rush Landmark Telford Nab., MD;  Location: [facility];  Service: Gastroenterology;  Laterality: N/A;   EXTERNAL EAR SURGERY     POLYPECTOMY  08/12/2021   Procedure: POLYPECTOMY;  Surgeon: Mansouraty, Telford Nab., MD;  Location: [facility];  Service: Gastroenterology;;   [place] EXTRACTION     Patient Active Problem List   Diagnosis Date Noted   Irritable bowel syndrome with both constipation and diarrhea 02/01/2022   Irritable bowel syndrome with diarrhea 12/15/2021   Generalized abdominal pain 12/15/2021   Class 3 severe obesity without serious comorbidity with body mass index (BMI) of 60.0 to 69.9 in  adult ([place]) 12/15/2021   Low back strain 37/07/6268   Folliculitis 48/54/6270   Chronic diarrhea 03/08/2021   Abdominal pain, epigastric 03/08/2021   Bloating 03/08/2021   Anxiety and depression 04/02/2020   Chronic lower back pain 04/02/2020   Urinary frequency 04/02/2020   Healthcare maintenance 03/02/2020   Morbid obesity ([place]) 03/02/2020   Allergic rhinitis 03/02/2020   Functional dyspepsia 02/28/2020   Chronic constipation     REFERRING DIAG:  S39.012A (ICD-10-CM) - Back strain, initial encounter  THERAPY DIAG:  Chronic right-sided low back pain, unspecified whether sciatica present  Muscle weakness (generalized)  Other abnormalities of gait and mobility  PERTINENT HISTORY:  Anxiety and depression  PRECAUTIONS:  None  SUBJECTIVE: Pt presents to PT with reports of continued lower back pain and discomfort especially with prolonged standing.   Pain: Are you having pain?  No: NPRS: 0/10 (8/10 at worst) especially with standing too long Pain location: lower back PAIN TYPE: aching and sharp Pain description: intermittent  Aggravating factors: ice/heat; crouching Relieving factors: standing >5 min, fwd bending, walking   OBJECTIVE:     PATIENT SURVEYS:  Modified Oswestry: 42% disability - 02/13/2022   MUSCLE LENGTH: Hamstrings: Right WFL deg; Left WFL deg   POSTURE:  Large body habitus; increased lumbar lordosis   PALPATION: TTP to R sided lumbar paraspinals   LUMBARAROM/PROM   A/PROM A/PROM  12/09/2021  Flexion Increased pain with repeated flexion  Extension Extension reduced; pain with repated motions   (Blank rows = not tested)   LE MMT:   MMT Right 12/09/2021 Left 12/09/2021 Right 02/25/22 Left 02/25/22  Hip flexion  5/5 5/5    Hip extension        Hip abduction 4/5 4/5 4+/5 4+/5  Hip adduction 3+/5 3+/5    Hip external rotation        Hip internal rotation        Knee extension 5/5 5/5    Knee flexion 5/5 5/5    Ankle dorsiflexion          Ankle plantarflexion        Ankle inversion        Ankle eversion        Grossly        (Blank rows = not tested)   LUMBAR SPECIAL TESTS:  Straight leg raise test: Positive Left   FUNCTIONAL TESTS:  30 seconds chair stand test: 7 reps   GAIT: Distance walked: 104f Assistive device utilized: None Level of assistance: Complete Independence Comments: decreased gait speed   TODAY'S TREATMENT  [facility] Adult PT Treatment:                                                DATE: 02/25/2022 Therapeutic Exercise: STS 2x10 Standing hip abd/ext 2x10 each 50# Chop x 10 BIL 10# Lift 7# x 10 each Paloff press 2x10 10# Leg press 3x10 80# - hips externally rotated    [facility]: 02/13/2022 Therapeutic Exercise: Standing hip abd/ext 2x10 each 50# Hex bar deadlift 70# x 8 - (hand discomfort) Chop x 10 10# Lift 7# x 10 each Paloff press 2x10 10# FM deadlift with cross bar 2x10 20# Therapeutic Activity: Assessment of tests/measures, goals, and outcomes for re-evaluation/re-cert  [facility]: [facility] Therapeutic Exercise: Supine SLR 2x10 each 2# STS from elevated table 2x10 - holding 10lb KB Paloff press 2x10 10# FM deadlift with cross bar 2x10 13# Chop at FM x 10 10# each Standing hip abd 2x10 37.5# each   PATIENT EDUCATION:  Education details: ODI, HEP, POC Person educated: Patient Education method: Explanation, Demonstration, and Handouts Education comprehension: verbalized understanding and returned demonstration     HOME EXERCISE PROGRAM: Access Code: [email or web address]: [email or web address] Date: 12/17/2021 Prepared by: DOctavio Manns Exercises Supine Posterior Pelvic Tilt - 1 x daily - 7 x weekly - 2 sets - 10 reps - 5 sec hold Supine Active Straight Leg Raise - 1 x daily - 7 x weekly - 2 sets - 10 reps Seated Hip Abduction with Resistance - 1 x daily - 7 x weekly - 3 sets - 20 reps - green tband hold Seated Hip Adduction  Squeeze with Ball - 1 x daily - 7 x weekly - 3 sets - 10 reps - 5 sec hold Sit to Stand - 1 x daily - 7 x weekly - 2 sets - 10 reps    ASSESSMENT:   CLINICAL IMPRESSION: Patient presents to PT with no current pain and reports up to 8/10 pain in the past week, she states her pain increases with prolonged standing. Session today focused on proximal hip and core strengthening. Patient was able to tolerate all prescribed exercises with no adverse effects. Patient continues to benefit from skilled PT services and should be  progressed as able to improve functional independence.    OBJECTIVE IMPAIRMENTS decreased activity tolerance, decreased endurance, decreased ROM, decreased strength, improper body mechanics, and pain.    ACTIVITY LIMITATIONS cleaning, community activity, driving, and yard work.    PERSONAL FACTORS Fitness, Time since onset of injury/illness/exacerbation, and 1-2 comorbidities: anxiety and depression  are also affecting patient's functional outcome.      GOALS: Goals reviewed with patient? Yes   SHORT TERM GOALS:   Pt will be compliant and knowledgeable with initial HEP for improved comfort and carryover Baseline: initial HEP given Target date: 12/30/2021 Goal status: MET   2.  Pt will self report low back pain no greater than 6/10 for improved comfort and functional ability Baseline: 10/10 at worst Target date: 12/30/2021 Goal status: MET   LONG TERM GOALS:   Pt will self report low back pain no greater than 3/10 for improved comfort and functional ability Baseline: 10/10 at worst Target date: 03/27/2022 Goal status: ONGOING   2.  Pt will decrease ODI score to no greater than 38% disability as proxy for functional improvement Baseline: 50% disability  12/24/2021 - 48% disability 02/13/2022: 42% disability Target date: 03/27/2022 Goal status: PARTIALLY MET   3.  Pt will be able to lift 50lb via deadlift from with no increase in LBP for improved functional ability  with ADL performance.  Baseline: unable Target date: 03/27/2022 Goal status: ONGOING   4.  Pt will increase 30 Second Sit to Stand rep count to no less than 10 reps for improved balance, strength, and functional mobility Baseline: 7 reps 02/13/2022: 7 reps Target date: 03/27/2022 Goal status: ONGOING   PLAN: PT FREQUENCY: 2x/week   PT DURATION: 6 weeks   PLANNED INTERVENTIONS: Therapeutic exercises, Therapeutic activity, Neuromuscular re-education, Balance training, Gait training, Patient/Family education, Joint mobilization, Aquatic Therapy, Dry Needling, Cryotherapy, Moist heat, and Manual therapy   PLAN FOR NEXT SESSION: assess HEP response; proximal hip and core strengthening    Evelene Croon, PTA 02/25/2022, 3:36 PM

## 2022-03-04 ENCOUNTER — Ambulatory Visit: Payer: Medicaid Other

## 2022-03-04 DIAGNOSIS — M6281 Muscle weakness (generalized): Secondary | ICD-10-CM

## 2022-03-04 DIAGNOSIS — M545 Low back pain, unspecified: Secondary | ICD-10-CM | POA: Diagnosis not present

## 2022-03-04 DIAGNOSIS — R2689 Other abnormalities of gait and mobility: Secondary | ICD-10-CM

## 2022-03-04 DIAGNOSIS — G8929 Other chronic pain: Secondary | ICD-10-CM

## 2022-03-04 NOTE — Therapy (Signed)
OUTPATIENT PHYSICAL THERAPY TREATMENT NOTE   Patient Name: Kathryn Andrews MRN: [id number] DOB:[birth year], 26 y.o., female Today's Date: 03/04/2022  PCP: Idamae Schuller, MD REFERRING PROVIDER: Lucious Groves, DO   PT End of Session - 03/04/22 1525     Visit Number 11    Number of Visits 17    Date for PT Re-Evaluation 03/27/22    PT Start Time 8295    PT Stop Time 1608    PT Time Calculation (min) 38 min    Activity Tolerance Patient tolerated treatment well    Behavior During Therapy [facility] for tasks assessed/performed                   Past Medical History:  Diagnosis Date   Anxiety    Asthma    Attention deficit hyperactivity disorder    Chest pain    Chronic constipation    Depression    Morbid obesity ([place]) 03/02/2020   Past Surgical History:  Procedure Laterality Date   adenoid removal     BIOPSY  08/12/2021   Procedure: BIOPSY;  Surgeon: Irving Copas., MD;  Location: [facility];  Service: Gastroenterology;;  EGD and COLON   COLONOSCOPY WITH PROPOFOL N/A 08/12/2021   Procedure: COLONOSCOPY WITH PROPOFOL;  Surgeon: Irving Copas., MD;  Location: [facility];  Service: Gastroenterology;  Laterality: N/A;   ESOPHAGOGASTRODUODENOSCOPY (EGD) WITH PROPOFOL N/A 08/12/2021   Procedure: ESOPHAGOGASTRODUODENOSCOPY (EGD) WITH PROPOFOL;  Surgeon: Rush Landmark Telford Nab., MD;  Location: [facility];  Service: Gastroenterology;  Laterality: N/A;   EXTERNAL EAR SURGERY     POLYPECTOMY  08/12/2021   Procedure: POLYPECTOMY;  Surgeon: Mansouraty, Telford Nab., MD;  Location: [facility];  Service: Gastroenterology;;   [place] EXTRACTION     Patient Active Problem List   Diagnosis Date Noted   Irritable bowel syndrome with both constipation and diarrhea 02/01/2022   Irritable bowel syndrome with diarrhea 12/15/2021   Generalized abdominal pain 12/15/2021   Class 3 severe obesity without serious comorbidity with body mass index (BMI) of 60.0 to 69.9  in adult ([place]) 12/15/2021   Low back strain 62/13/0865   Folliculitis 78/46/9629   Chronic diarrhea 03/08/2021   Abdominal pain, epigastric 03/08/2021   Bloating 03/08/2021   Anxiety and depression 04/02/2020   Chronic lower back pain 04/02/2020   Urinary frequency 04/02/2020   Healthcare maintenance 03/02/2020   Morbid obesity ([place]) 03/02/2020   Allergic rhinitis 03/02/2020   Functional dyspepsia 02/28/2020   Chronic constipation     REFERRING DIAG:  S39.012A (ICD-10-CM) - Back strain, initial encounter  THERAPY DIAG:  Chronic right-sided low back pain, unspecified whether sciatica present  Muscle weakness (generalized)  Other abnormalities of gait and mobility  PERTINENT HISTORY:  Anxiety and depression  PRECAUTIONS:  None  SUBJECTIVE:  Pt presents to PT with no current reports of pain. Has continued HEP compliance with no adverse effect. Pt is ready to begin PT at this time.   Pain: Are you having pain?  No: NPRS: 0/10 (8/10 at worst) especially with standing too long Pain location: lower back PAIN TYPE: aching and sharp Pain description: intermittent  Aggravating factors: ice/heat; crouching Relieving factors: standing >5 min, fwd bending, walking   OBJECTIVE:     PATIENT SURVEYS:  Modified Oswestry: 42% disability - 02/13/2022   MUSCLE LENGTH: Hamstrings: Right WFL deg; Left WFL deg   POSTURE:  Large body habitus; increased lumbar lordosis   PALPATION: TTP to R sided lumbar paraspinals   LUMBARAROM/PROM  A/PROM A/PROM  12/09/2021  Flexion Increased pain with repeated flexion  Extension Extension reduced; pain with repated motions   (Blank rows = not tested)   LE MMT:   MMT Right 12/09/2021 Left 12/09/2021 Right 02/25/22 Left 02/25/22  Hip flexion  5/5 5/5    Hip extension        Hip abduction 4/5 4/5 4+/5 4+/5  Hip adduction 3+/5 3+/5    Hip external rotation        Hip internal rotation        Knee extension 5/5 5/5    Knee flexion 5/5  5/5    Ankle dorsiflexion         Ankle plantarflexion        Ankle inversion        Ankle eversion        Grossly        (Blank rows = not tested)   LUMBAR SPECIAL TESTS:  Straight leg raise test: Positive Left   FUNCTIONAL TESTS:  30 seconds chair stand test: 7 reps   GAIT: Distance walked: 73f Assistive device utilized: None Level of assistance: Complete Independence Comments: decreased gait speed   TODAY'S TREATMENT  [facility] Adult PT Treatment:                                                DATE: 03/04/2022 Therapeutic Exercise: NuStep lvl 6 LE only x 3 min while taking subjective Standing hip abd/ext 2x10 each 50# Chop x 10 BIL 13# Lift 7# x 10 each Paloff press 2x10 13# KB Deadlift 2x10 25# from 8in box Leg press 3x10 100# - hips externally rotated   [facility]                                                [facility]: [facility] Therapeutic Exercise: STS 2x10 Standing hip abd/ext 2x10 each 50# Chop x 10 BIL 10# Lift 7# x 10 each Paloff press 2x10 10# Leg press 3x10 80# - hips externally rotated    [facility]: 02[facility]13[facility]2022 Therapeutic Exercise: Standing hip abd/ext 2x10 each 50# Hex bar deadlift 70# x 8 - (hand discomfort) Chop x 10 10# Lift 7# x 10 each Paloff press 2x10 10# FM deadlift with cross bar 2x10 20# Therapeutic Activity: Assessment of tests/measures, goals, and outcomes for re-evaluation/re-cert  PATIENT EDUCATION:  Education details: ODI, HEP, POC Person educated: Patient Education method: Explanation, Demonstration, and Handouts Education comprehension: verbalized understanding and returned demonstration     HOME EXERCISE PROGRAM: Access Code: [email or web address]: [email or web address] Date: 12/17/2021 Prepared by: DOctavio Manns Exercises Supine Posterior Pelvic Tilt - 1 x daily - 7 x weekly - 2 sets - 10 reps - 5 sec hold Supine Active Straight Leg Raise - 1 x daily - 7 x weekly - 2 sets - 10  reps Seated Hip Abduction with Resistance - 1 x daily - 7 x weekly - 3 sets - 20 reps - green tband hold Seated Hip Adduction Squeeze with Ball - 1 x daily - 7 x weekly - 3 sets - 10 reps - 5 sec hold Sit to Stand - 1 x daily - 7 x weekly - 2 sets - 10  reps    ASSESSMENT:   CLINICAL IMPRESSION: Pt was able to complete all prescribed exercises with no adverse effect or increase in pain. Therapy focused on improving core and proximal hip strength and mobility today, with pt continuing to show improving strength and activity tolerance. She continues to benefit from skilled PT and will continue ot be seen and progressed as able per POC.    OBJECTIVE IMPAIRMENTS decreased activity tolerance, decreased endurance, decreased ROM, decreased strength, improper body mechanics, and pain.    ACTIVITY LIMITATIONS cleaning, community activity, driving, and yard work.    PERSONAL FACTORS Fitness, Time since onset of injury/illness/exacerbation, and 1-2 comorbidities: anxiety and depression  are also affecting patient's functional outcome.      GOALS: Goals reviewed with patient? Yes   SHORT TERM GOALS:   Pt will be compliant and knowledgeable with initial HEP for improved comfort and carryover Baseline: initial HEP given Target date: 12/30/2021 Goal status: MET   2.  Pt will self report low back pain no greater than 6/10 for improved comfort and functional ability Baseline: 10/10 at worst Target date: 12/30/2021 Goal status: MET   LONG TERM GOALS:   Pt will self report low back pain no greater than 3/10 for improved comfort and functional ability Baseline: 10/10 at worst Target date: 03/27/2022 Goal status: ONGOING   2.  Pt will decrease ODI score to no greater than 38% disability as proxy for functional improvement Baseline: 50% disability  12/24/2021 - 48% disability 02/13/2022: 42% disability Target date: 03/27/2022 Goal status: PARTIALLY MET   3.  Pt will be able to lift 50lb via  deadlift from with no increase in LBP for improved functional ability with ADL performance.  Baseline: unable Target date: 03/27/2022 Goal status: ONGOING   4.  Pt will increase 30 Second Sit to Stand rep count to no less than 10 reps for improved balance, strength, and functional mobility Baseline: 7 reps 02/13/2022: 7 reps Target date: 03/27/2022 Goal status: ONGOING   PLAN: PT FREQUENCY: 2x/week   PT DURATION: 6 weeks   PLANNED INTERVENTIONS: Therapeutic exercises, Therapeutic activity, Neuromuscular re-education, Balance training, Gait training, Patient/Family education, Joint mobilization, Aquatic Therapy, Dry Needling, Cryotherapy, Moist heat, and Manual therapy   PLAN FOR NEXT SESSION: assess HEP response; proximal hip and core strengthening    Ward Chatters, PT 03/04/2022, 4:10 PM

## 2022-03-06 ENCOUNTER — Ambulatory Visit: Payer: Medicaid Other | Attending: Internal Medicine

## 2022-03-06 DIAGNOSIS — G8929 Other chronic pain: Secondary | ICD-10-CM | POA: Diagnosis present

## 2022-03-06 DIAGNOSIS — M6281 Muscle weakness (generalized): Secondary | ICD-10-CM | POA: Diagnosis present

## 2022-03-06 DIAGNOSIS — M545 Low back pain, unspecified: Secondary | ICD-10-CM | POA: Insufficient documentation

## 2022-03-06 DIAGNOSIS — R2689 Other abnormalities of gait and mobility: Secondary | ICD-10-CM | POA: Diagnosis present

## 2022-03-06 NOTE — Therapy (Signed)
OUTPATIENT PHYSICAL THERAPY TREATMENT NOTE   Patient Name: Kathryn Andrews MRN: [id number] DOB:[birth year], 26 y.o., female Today's Date: 03/06/2022  PCP: Idamae Schuller, MD REFERRING PROVIDER: Idamae Schuller, MD   PT End of Session - 03/06/22 1508     Visit Number 12    Number of Visits 17    Date for PT Re-Evaluation 03/27/22    PT Start Time 1510    PT Stop Time 1555    PT Time Calculation (min) 45 min    Activity Tolerance Patient tolerated treatment well    Behavior During Therapy [facility] for tasks assessed/performed                    Past Medical History:  Diagnosis Date   Anxiety    Asthma    Attention deficit hyperactivity disorder    Chest pain    Chronic constipation    Depression    Morbid obesity ([place]) 03/02/2020   Past Surgical History:  Procedure Laterality Date   adenoid removal     BIOPSY  08/12/2021   Procedure: BIOPSY;  Surgeon: Irving Copas., MD;  Location: [facility];  Service: Gastroenterology;;  EGD and COLON   COLONOSCOPY WITH PROPOFOL N/A 08/12/2021   Procedure: COLONOSCOPY WITH PROPOFOL;  Surgeon: Irving Copas., MD;  Location: [facility];  Service: Gastroenterology;  Laterality: N/A;   ESOPHAGOGASTRODUODENOSCOPY (EGD) WITH PROPOFOL N/A 08/12/2021   Procedure: ESOPHAGOGASTRODUODENOSCOPY (EGD) WITH PROPOFOL;  Surgeon: Rush Landmark Telford Nab., MD;  Location: [facility];  Service: Gastroenterology;  Laterality: N/A;   EXTERNAL EAR SURGERY     POLYPECTOMY  08/12/2021   Procedure: POLYPECTOMY;  Surgeon: Mansouraty, Telford Nab., MD;  Location: [facility];  Service: Gastroenterology;;   [place] EXTRACTION     Patient Active Problem List   Diagnosis Date Noted   Irritable bowel syndrome with both constipation and diarrhea 02/01/2022   Irritable bowel syndrome with diarrhea 12/15/2021   Generalized abdominal pain 12/15/2021   Class 3 severe obesity without serious comorbidity with body mass index (BMI) of 60.0 to 69.9 in  adult ([place]) 12/15/2021   Low back strain 99/77/4142   Folliculitis 39/53/2023   Chronic diarrhea 03/08/2021   Abdominal pain, epigastric 03/08/2021   Bloating 03/08/2021   Anxiety and depression 04/02/2020   Chronic lower back pain 04/02/2020   Urinary frequency 04/02/2020   Healthcare maintenance 03/02/2020   Morbid obesity ([place]) 03/02/2020   Allergic rhinitis 03/02/2020   Functional dyspepsia 02/28/2020   Chronic constipation     REFERRING DIAG:  S39.012A (ICD-10-CM) - Back strain, initial encounter  THERAPY DIAG:  Chronic right-sided low back pain, unspecified whether sciatica present  Muscle weakness (generalized)  Other abnormalities of gait and mobility  PERTINENT HISTORY:  Anxiety and depression  PRECAUTIONS:  None  SUBJECTIVE: Patient reports that she had a lot of pain yesterday and this morning.  Pain: Are you having pain?  No: NPRS: 8/10 (8/10 at worst) especially with standing too long Pain location: lower back PAIN TYPE: aching and sharp Pain description: intermittent  Aggravating factors: ice/heat; crouching Relieving factors: standing >5 min, fwd bending, walking   OBJECTIVE:     PATIENT SURVEYS:  Modified Oswestry: 42% disability - 02/13/2022   MUSCLE LENGTH: Hamstrings: Right WFL deg; Left WFL deg   POSTURE:  Large body habitus; increased lumbar lordosis   PALPATION: TTP to R sided lumbar paraspinals   LUMBARAROM/PROM   A/PROM A/PROM  12/09/2021  Flexion Increased pain with repeated flexion  Extension Extension reduced;  pain with repated motions   (Blank rows = not tested)   LE MMT:   MMT Right 12/09/2021 Left 12/09/2021 Right 02/25/22 Left 02/25/22  Hip flexion  5/5 5/5    Hip extension        Hip abduction 4/5 4/5 4+/5 4+/5  Hip adduction 3+/5 3+/5    Hip external rotation        Hip internal rotation        Knee extension 5/5 5/5    Knee flexion 5/5 5/5    Ankle dorsiflexion         Ankle plantarflexion        Ankle  inversion        Ankle eversion        Grossly        (Blank rows = not tested)   LUMBAR SPECIAL TESTS:  Straight leg raise test: Positive Left   FUNCTIONAL TESTS:  30 seconds chair stand test: 7 reps   GAIT: Distance walked: 27f Assistive device utilized: None Level of assistance: Complete Independence Comments: decreased gait speed   TODAY'S TREATMENT  [facility] Adult PT Treatment:                                                DATE: 03/06/2022 Therapeutic Exercise: NuStep lvl 6 LE only x 5 min while taking subjective Standing hip abd/ext 2x10 each 50# Chop x 10 BIL 13# Lift 7# x 10 each Paloff press 2x10 13# Shoulder extension 10# 2x10 Leg press 3x10 100# - hips externally rotated STS x10 holding 15# KB   [facility] Adult PT Treatment:                                                DATE: 03/04/2022 Therapeutic Exercise: NuStep lvl 6 LE only x 3 min while taking subjective Standing hip abd/ext 2x10 each 50# Chop x 10 BIL 13# Lift 7# x 10 each Paloff press 2x10 13# KB Deadlift 2x10 25# from 8in box Leg press 3x10 100# - hips externally rotated   [facility]                                                DATE: 02/25/2022 Therapeutic Exercise: STS 2x10 Standing hip abd/ext 2x10 each 50# Chop x 10 BIL 10# Lift 7# x 10 each Paloff press 2x10 10# Leg press 3x10 80# - hips externally rotated    PATIENT EDUCATION:  Education details: ODI, HEP, POC Person educated: Patient Education method: Explanation, Demonstration, and Handouts Education comprehension: verbalized understanding and returned demonstration     HOME EXERCISE PROGRAM: Access Code: [email or web address]: [email or web address] Date: 12/17/2021 Prepared by: DOctavio Manns Exercises Supine Posterior Pelvic Tilt - 1 x daily - 7 x weekly - 2 sets - 10 reps - 5 sec hold Supine Active Straight Leg Raise - 1 x daily - 7 x weekly - 2 sets - 10 reps Seated Hip Abduction with Resistance - 1 x daily - 7  x weekly - 3 sets - 20 reps - green tband hold Seated Hip Adduction Squeeze  with Ball - 1 x daily - 7 x weekly - 3 sets - 10 reps - 5 sec hold Sit to Stand - 1 x daily - 7 x weekly - 2 sets - 10 reps    ASSESSMENT:   CLINICAL IMPRESSION: Patient presents to PT with high levels of low back pain and states the day after her last session she had 10/10 pain. Session today focused on core and proximal hip strengthening and improving overall activity tolerance. She is interested in picking back up aquatic therapy. Patient continues to benefit from skilled PT services and should be progressed as able to improve functional independence.   OBJECTIVE IMPAIRMENTS decreased activity tolerance, decreased endurance, decreased ROM, decreased strength, improper body mechanics, and pain.    ACTIVITY LIMITATIONS cleaning, community activity, driving, and yard work.    PERSONAL FACTORS Fitness, Time since onset of injury/illness/exacerbation, and 1-2 comorbidities: anxiety and depression  are also affecting patient's functional outcome.      GOALS: Goals reviewed with patient? Yes   SHORT TERM GOALS:   Pt will be compliant and knowledgeable with initial HEP for improved comfort and carryover Baseline: initial HEP given Target date: 12/30/2021 Goal status: MET   2.  Pt will self report low back pain no greater than 6/10 for improved comfort and functional ability Baseline: 10/10 at worst Target date: 12/30/2021 Goal status: MET   LONG TERM GOALS:   Pt will self report low back pain no greater than 3/10 for improved comfort and functional ability Baseline: 10/10 at worst Target date: 03/27/2022 Goal status: ONGOING   2.  Pt will decrease ODI score to no greater than 38% disability as proxy for functional improvement Baseline: 50% disability  12/24/2021 - 48% disability 02/13/2022: 42% disability Target date: 03/27/2022 Goal status: PARTIALLY MET   3.  Pt will be able to lift 50lb via deadlift from  with no increase in LBP for improved functional ability with ADL performance.  Baseline: unable Target date: 03/27/2022 Goal status: ONGOING   4.  Pt will increase 30 Second Sit to Stand rep count to no less than 10 reps for improved balance, strength, and functional mobility Baseline: 7 reps 02/13/2022: 7 reps Target date: 03/27/2022 Goal status: ONGOING   PLAN: PT FREQUENCY: 2x/week   PT DURATION: 6 weeks   PLANNED INTERVENTIONS: Therapeutic exercises, Therapeutic activity, Neuromuscular re-education, Balance training, Gait training, Patient/Family education, Joint mobilization, Aquatic Therapy, Dry Needling, Cryotherapy, Moist heat, and Manual therapy   PLAN FOR NEXT SESSION: assess HEP response; proximal hip and core strengthening    Evelene Croon, PTA 03/06/2022, 3:52 PM

## 2022-03-11 ENCOUNTER — Ambulatory Visit: Payer: Medicaid Other

## 2022-03-11 DIAGNOSIS — M545 Low back pain, unspecified: Secondary | ICD-10-CM | POA: Diagnosis not present

## 2022-03-11 DIAGNOSIS — M6281 Muscle weakness (generalized): Secondary | ICD-10-CM

## 2022-03-11 DIAGNOSIS — G8929 Other chronic pain: Secondary | ICD-10-CM

## 2022-03-11 DIAGNOSIS — R2689 Other abnormalities of gait and mobility: Secondary | ICD-10-CM

## 2022-03-11 NOTE — Therapy (Signed)
OUTPATIENT PHYSICAL THERAPY TREATMENT NOTE   Patient Name: Kathryn Andrews MRN: [id number] DOB:07-16-1996, 26 y.o., female Today's Date: 03/11/2022  PCP: Idamae Schuller, MD REFERRING PROVIDER: Idamae Schuller, MD   PT End of Session - 03/11/22 1531     Visit Number 13    Number of Visits 17    Date for PT Re-Evaluation 03/27/22    PT Start Time 3614    PT Stop Time 1610    PT Time Calculation (min) 39 min    Activity Tolerance Patient tolerated treatment well    Behavior During Therapy [facility] for tasks assessed/performed                     Past Medical History:  Diagnosis Date   Anxiety    Asthma    Attention deficit hyperactivity disorder    Chest pain    Chronic constipation    Depression    Morbid obesity ([place]) 03/02/2020   Past Surgical History:  Procedure Laterality Date   adenoid removal     BIOPSY  08/12/2021   Procedure: BIOPSY;  Surgeon: Irving Copas., MD;  Location: [facility];  Service: Gastroenterology;;  EGD and COLON   COLONOSCOPY WITH PROPOFOL N/A 08/12/2021   Procedure: COLONOSCOPY WITH PROPOFOL;  Surgeon: Irving Copas., MD;  Location: [facility];  Service: Gastroenterology;  Laterality: N/A;   ESOPHAGOGASTRODUODENOSCOPY (EGD) WITH PROPOFOL N/A 08/12/2021   Procedure: ESOPHAGOGASTRODUODENOSCOPY (EGD) WITH PROPOFOL;  Surgeon: Rush Landmark Telford Nab., MD;  Location: [facility];  Service: Gastroenterology;  Laterality: N/A;   EXTERNAL EAR SURGERY     POLYPECTOMY  08/12/2021   Procedure: POLYPECTOMY;  Surgeon: Mansouraty, Telford Nab., MD;  Location: [facility];  Service: Gastroenterology;;   [place] EXTRACTION     Patient Active Problem List   Diagnosis Date Noted   Irritable bowel syndrome with both constipation and diarrhea 02/01/2022   Irritable bowel syndrome with diarrhea 12/15/2021   Generalized abdominal pain 12/15/2021   Class 3 severe obesity without serious comorbidity with body mass index (BMI) of 60.0 to 69.9  in adult ([place]) 12/15/2021   Low back strain 43/15/4008   Folliculitis 67/61/9509   Chronic diarrhea 03/08/2021   Abdominal pain, epigastric 03/08/2021   Bloating 03/08/2021   Anxiety and depression 04/02/2020   Chronic lower back pain 04/02/2020   Urinary frequency 04/02/2020   Healthcare maintenance 03/02/2020   Morbid obesity ([place]) 03/02/2020   Allergic rhinitis 03/02/2020   Functional dyspepsia 02/28/2020   Chronic constipation     REFERRING DIAG:  S39.012A (ICD-10-CM) - Back strain, initial encounter  THERAPY DIAG:  Chronic right-sided low back pain, unspecified whether sciatica present  Muscle weakness (generalized)  Other abnormalities of gait and mobility  PERTINENT HISTORY:  Anxiety and depression  PRECAUTIONS:  None  SUBJECTIVE:  Pt presents to PT with no current reports of pain or discomfort. She has continued compliance with HEP with no adverse effect. She is ready to begin PT at this time.   Pain: Are you having pain?  No: NPRS: 0/10 (8/10 at worst) especially with standing too long Pain location: lower back PAIN TYPE: aching and sharp Pain description: intermittent  Aggravating factors: ice/heat; crouching Relieving factors: standing >5 min, fwd bending, walking   OBJECTIVE:     PATIENT SURVEYS:  Modified Oswestry: 42% disability - 02/13/2022   MUSCLE LENGTH: Hamstrings: Right WFL deg; Left WFL deg   POSTURE:  Large body habitus; increased lumbar lordosis   PALPATION: TTP to R sided lumbar  paraspinals   LUMBARAROM/PROM   A/PROM A/PROM  12/09/2021  Flexion Increased pain with repeated flexion  Extension Extension reduced; pain with repated motions   (Blank rows = not tested)   LE MMT:   MMT Right 12/09/2021 Left 12/09/2021 Right 02/25/22 Left 02/25/22  Hip flexion  5/5 5/5    Hip extension        Hip abduction 4/5 4/5 4+/5 4+/5  Hip adduction 3+/5 3+/5    Hip external rotation        Hip internal rotation        Knee extension 5/5 5/5     Knee flexion 5/5 5/5    Ankle dorsiflexion         Ankle plantarflexion        Ankle inversion        Ankle eversion        Grossly        (Blank rows = not tested)   LUMBAR SPECIAL TESTS:  Straight leg raise test: Positive Left   FUNCTIONAL TESTS:  30 seconds chair stand test: 7 reps   GAIT: Distance walked: 67f Assistive device utilized: None Level of assistance: Complete Independence Comments: decreased gait speed   TODAY'S TREATMENT  [facility] Adult PT Treatment:                                                DATE: 03/11/2022 Therapeutic Exercise: NuStep lvl 6 LE only x 3 min while taking subjective Standing hip abd/ext 2x10 each 50# Chop x 10 BIL 13# Lift 7# x 10 each Paloff press 2x10 13# Shoulder extension 10# x 15 Leg press 3x10 100# - hips externally rotated Deadlift 2x10 20# (inc back pain second set)  STS x10 holding 15# KB  [facility] Adult PT Treatment:                                                DATE: 03/06/2022 Therapeutic Exercise: NuStep lvl 6 LE only x 5 min while taking subjective Standing hip abd/ext 2x10 each 50# Chop x 10 BIL 13# Lift 7# x 10 each Paloff press 2x10 13# Shoulder extension 10# 2x10 Leg press 3x10 100# - hips externally rotated STS x10 holding 15# KB   [facility] Adult PT Treatment:                                                DATE: 03/04/2022 Therapeutic Exercise: NuStep lvl 6 LE only x 3 min while taking subjective Standing hip abd/ext 2x10 each 50# Chop x 10 BIL 13# Lift 7# x 10 each Paloff press 2x10 13# KB Deadlift 2x10 25# from 8in box Leg press 3x10 100# - hips externally rotated   PATIENT EDUCATION:  Education details: ODI, HEP, POC Person educated: Patient Education method: Explanation, Demonstration, and Handouts Education comprehension: verbalized understanding and returned demonstration     HOME EXERCISE PROGRAM: Access Code: [email or web address]: [email or web address] Date: 12/17/2021 Prepared by: DOctavio Manns Exercises Supine Posterior Pelvic Tilt - 1 x daily - 7 x weekly - 2 sets - 10 reps - 5 sec  hold Supine Active Straight Leg Raise - 1 x daily - 7 x weekly - 2 sets - 10 reps Seated Hip Abduction with Resistance - 1 x daily - 7 x weekly - 3 sets - 20 reps - green tband hold Seated Hip Adduction Squeeze with Ball - 1 x daily - 7 x weekly - 3 sets - 10 reps - 5 sec hold Sit to Stand - 1 x daily - 7 x weekly - 2 sets - 10 reps    ASSESSMENT:   CLINICAL IMPRESSION: Pt was able to complete all prescribed exercises with no adverse effect or increase in pain. Therapy focused on improving core and proximal hip strength for decreasing pain and improving function. Pt continues to benefit form skilled PT services, will attempt to alternate gym and aquatic sessions. Will continue per POC.     OBJECTIVE IMPAIRMENTS decreased activity tolerance, decreased endurance, decreased ROM, decreased strength, improper body mechanics, and pain.    ACTIVITY LIMITATIONS cleaning, community activity, driving, and yard work.    PERSONAL FACTORS Fitness, Time since onset of injury/illness/exacerbation, and 1-2 comorbidities: anxiety and depression  are also affecting patient's functional outcome.      GOALS: Goals reviewed with patient? Yes   SHORT TERM GOALS:   Pt will be compliant and knowledgeable with initial HEP for improved comfort and carryover Baseline: initial HEP given Target date: 12/30/2021 Goal status: MET   2.  Pt will self report low back pain no greater than 6/10 for improved comfort and functional ability Baseline: 10/10 at worst Target date: 12/30/2021 Goal status: MET   LONG TERM GOALS:   Pt will self report low back pain no greater than 3/10 for improved comfort and functional ability Baseline: 10/10 at worst Target date: 03/27/2022 Goal status: ONGOING   2.  Pt will decrease ODI score to no greater than 38% disability as proxy for functional improvement Baseline: 50%  disability  12/24/2021 - 48% disability 02/13/2022: 42% disability Target date: 03/27/2022 Goal status: PARTIALLY MET   3.  Pt will be able to lift 50lb via deadlift from with no increase in LBP for improved functional ability with ADL performance.  Baseline: unable Target date: 03/27/2022 Goal status: ONGOING   4.  Pt will increase 30 Second Sit to Stand rep count to no less than 10 reps for improved balance, strength, and functional mobility Baseline: 7 reps 02/13/2022: 7 reps Target date: 03/27/2022 Goal status: ONGOING   PLAN: PT FREQUENCY: 2x/week   PT DURATION: 6 weeks   PLANNED INTERVENTIONS: Therapeutic exercises, Therapeutic activity, Neuromuscular re-education, Balance training, Gait training, Patient/Family education, Joint mobilization, Aquatic Therapy, Dry Needling, Cryotherapy, Moist heat, and Manual therapy   PLAN FOR NEXT SESSION: assess HEP response; proximal hip and core strengthening    Ward Chatters, PT 03/11/2022, 4:15 PM

## 2022-03-13 ENCOUNTER — Ambulatory Visit: Payer: Medicaid Other

## 2022-03-13 NOTE — Therapy (Incomplete)
OUTPATIENT PHYSICAL THERAPY TREATMENT NOTE   Patient Name: Kathryn Andrews MRN: [id number] DOB:1996/06/14, 26 y.o., female Today's Date: 03/13/2022  PCP: Idamae Schuller, MD REFERRING PROVIDER: Idamae Schuller, MD             Past Medical History:  Diagnosis Date   Anxiety    Asthma    Attention deficit hyperactivity disorder    Chest pain    Chronic constipation    Depression    Morbid obesity ([place]) 03/02/2020   Past Surgical History:  Procedure Laterality Date   adenoid removal     BIOPSY  08/12/2021   Procedure: BIOPSY;  Surgeon: Irving Copas., MD;  Location: [facility];  Service: Gastroenterology;;  EGD and COLON   COLONOSCOPY WITH PROPOFOL N/A 08/12/2021   Procedure: COLONOSCOPY WITH PROPOFOL;  Surgeon: Irving Copas., MD;  Location: [facility];  Service: Gastroenterology;  Laterality: N/A;   ESOPHAGOGASTRODUODENOSCOPY (EGD) WITH PROPOFOL N/A 08/12/2021   Procedure: ESOPHAGOGASTRODUODENOSCOPY (EGD) WITH PROPOFOL;  Surgeon: Rush Landmark Telford Nab., MD;  Location: [facility];  Service: Gastroenterology;  Laterality: N/A;   EXTERNAL EAR SURGERY     POLYPECTOMY  08/12/2021   Procedure: POLYPECTOMY;  Surgeon: Mansouraty, Telford Nab., MD;  Location: [facility];  Service: Gastroenterology;;   [place] EXTRACTION     Patient Active Problem List   Diagnosis Date Noted   Irritable bowel syndrome with both constipation and diarrhea 02/01/2022   Irritable bowel syndrome with diarrhea 12/15/2021   Generalized abdominal pain 12/15/2021   Class 3 severe obesity without serious comorbidity with body mass index (BMI) of 60.0 to 69.9 in adult ([place]) 12/15/2021   Low back strain 00/45/9977   Folliculitis 41/42/3953   Chronic diarrhea 03/08/2021   Abdominal pain, epigastric 03/08/2021   Bloating 03/08/2021   Anxiety and depression 04/02/2020   Chronic lower back pain 04/02/2020   Urinary frequency 04/02/2020   Healthcare maintenance 03/02/2020   Morbid  obesity ([place]) 03/02/2020   Allergic rhinitis 03/02/2020   Functional dyspepsia 02/28/2020   Chronic constipation     REFERRING DIAG:  S39.012A (ICD-10-CM) - Back strain, initial encounter  THERAPY DIAG:  No diagnosis found.  PERTINENT HISTORY:  Anxiety and depression  PRECAUTIONS:  None  SUBJECTIVE:  *** Pt presents to PT with no current reports of pain or discomfort. She has continued compliance with HEP with no adverse effect. She is ready to begin PT at this time.   Pain: Are you having pain?  No: NPRS: ***/10 (8/10 at worst) especially with standing too long Pain location: lower back PAIN TYPE: aching and sharp Pain description: intermittent  Aggravating factors: ice/heat; crouching Relieving factors: standing >5 min, fwd bending, walking   OBJECTIVE:     PATIENT SURVEYS:  Modified Oswestry: 42% disability - 02/13/2022   MUSCLE LENGTH: Hamstrings: Right WFL deg; Left WFL deg   POSTURE:  Large body habitus; increased lumbar lordosis   PALPATION: TTP to R sided lumbar paraspinals   LUMBARAROM/PROM   A/PROM A/PROM  12/09/2021  Flexion Increased pain with repeated flexion  Extension Extension reduced; pain with repated motions   (Blank rows = not tested)   LE MMT:   MMT Right 12/09/2021 Left 12/09/2021 Right 02/25/22 Left 02/25/22  Hip flexion  5/5 5/5    Hip extension        Hip abduction 4/5 4/5 4+/5 4+/5  Hip adduction 3+/5 3+/5    Hip external rotation        Hip internal rotation  Knee extension 5/5 5/5    Knee flexion 5/5 5/5    Ankle dorsiflexion         Ankle plantarflexion        Ankle inversion        Ankle eversion        Grossly        (Blank rows = not tested)   LUMBAR SPECIAL TESTS:  Straight leg raise test: Positive Left   FUNCTIONAL TESTS:  30 seconds chair stand test: 7 reps   GAIT: Distance walked: 2f Assistive device utilized: None Level of assistance: Complete Independence Comments: decreased gait speed    TODAY'S TREATMENT  [facility] Adult PT Treatment:                                                DATE: 03/13/2022 Therapeutic Exercise: NuStep lvl 7 LE only x 3 min while taking subjective Standing hip abd/ext 2x10 each 50# Chop x 10 BIL 13# Lift 7# x 10 each Paloff press 2x10 13# Shoulder extension 10# x 15 Leg press 3x10 100# - hips externally rotated Deadlift 2x10 20# (inc back pain second set)  STS x10 holding 15# KB   [facility] Adult PT Treatment:                                                DATE: 03/11/2022 Therapeutic Exercise: NuStep lvl 6 LE only x 3 min while taking subjective Standing hip abd/ext 2x10 each 50# Chop x 10 BIL 13# Lift 7# x 10 each Paloff press 2x10 13# Shoulder extension 10# x 15 Leg press 3x10 100# - hips externally rotated Deadlift 2x10 20# (inc back pain second set)  STS x10 holding 15# KB  [facility] Adult PT Treatment:                                                DATE: 03/06/2022 Therapeutic Exercise: NuStep lvl 6 LE only x 5 min while taking subjective Standing hip abd/ext 2x10 each 50# Chop x 10 BIL 13# Lift 7# x 10 each Paloff press 2x10 13# Shoulder extension 10# 2x10 Leg press 3x10 100# - hips externally rotated STS x10 holding 15# KB   PATIENT EDUCATION:  Education details: ODI, HEP, POC Person educated: Patient Education method: Explanation, Demonstration, and Handouts Education comprehension: verbalized understanding and returned demonstration     HOME EXERCISE PROGRAM: Access Code: [email or web address]: [email or web address] Date: 12/17/2021 Prepared by: DOctavio Manns Exercises Supine Posterior Pelvic Tilt - 1 x daily - 7 x weekly - 2 sets - 10 reps - 5 sec hold Supine Active Straight Leg Raise - 1 x daily - 7 x weekly - 2 sets - 10 reps Seated Hip Abduction with Resistance - 1 x daily - 7 x weekly - 3 sets - 20 reps - green tband hold Seated Hip Adduction Squeeze with Ball - 1 x daily - 7 x weekly - 3 sets - 10 reps - 5 sec  hold Sit to Stand - 1 x daily - 7 x weekly - 2 sets - 10 reps  ASSESSMENT:   CLINICAL IMPRESSION: ***  Pt was able to complete all prescribed exercises with no adverse effect or increase in pain. Therapy focused on improving core and proximal hip strength for decreasing pain and improving function. Pt continues to benefit form skilled PT services, will attempt to alternate gym and aquatic sessions. Will continue per POC.     OBJECTIVE IMPAIRMENTS decreased activity tolerance, decreased endurance, decreased ROM, decreased strength, improper body mechanics, and pain.    ACTIVITY LIMITATIONS cleaning, community activity, driving, and yard work.    PERSONAL FACTORS Fitness, Time since onset of injury/illness/exacerbation, and 1-2 comorbidities: anxiety and depression  are also affecting patient's functional outcome.      GOALS: Goals reviewed with patient? Yes   SHORT TERM GOALS:   Pt will be compliant and knowledgeable with initial HEP for improved comfort and carryover Baseline: initial HEP given Target date: 12/30/2021 Goal status: MET   2.  Pt will self report low back pain no greater than 6/10 for improved comfort and functional ability Baseline: 10/10 at worst Target date: 12/30/2021 Goal status: MET   LONG TERM GOALS:   Pt will self report low back pain no greater than 3/10 for improved comfort and functional ability Baseline: 10/10 at worst Target date: 03/27/2022 Goal status: ONGOING   2.  Pt will decrease ODI score to no greater than 38% disability as proxy for functional improvement Baseline: 50% disability  12/24/2021 - 48% disability 02/13/2022: 42% disability Target date: 03/27/2022 Goal status: PARTIALLY MET   3.  Pt will be able to lift 50lb via deadlift from with no increase in LBP for improved functional ability with ADL performance.  Baseline: unable Target date: 03/27/2022 Goal status: ONGOING   4.  Pt will increase 30 Second Sit to Stand rep count to no  less than 10 reps for improved balance, strength, and functional mobility Baseline: 7 reps 02/13/2022: 7 reps Target date: 03/27/2022 Goal status: ONGOING   PLAN: PT FREQUENCY: 2x/week   PT DURATION: 6 weeks   PLANNED INTERVENTIONS: Therapeutic exercises, Therapeutic activity, Neuromuscular re-education, Balance training, Gait training, Patient/Family education, Joint mobilization, Aquatic Therapy, Dry Needling, Cryotherapy, Moist heat, and Manual therapy   PLAN FOR NEXT SESSION: assess HEP response; proximal hip and core strengthening    Evelene Croon, PTA 03/13/2022, 10:56 AM

## 2022-03-18 ENCOUNTER — Ambulatory Visit: Payer: Medicaid Other

## 2022-03-18 DIAGNOSIS — R2689 Other abnormalities of gait and mobility: Secondary | ICD-10-CM

## 2022-03-18 DIAGNOSIS — M545 Low back pain, unspecified: Secondary | ICD-10-CM

## 2022-03-18 DIAGNOSIS — G8929 Other chronic pain: Secondary | ICD-10-CM

## 2022-03-18 DIAGNOSIS — M6281 Muscle weakness (generalized): Secondary | ICD-10-CM

## 2022-03-18 NOTE — Therapy (Signed)
OUTPATIENT PHYSICAL THERAPY TREATMENT NOTE   Patient Name: Kathryn Andrews MRN: [id number] DOB:18-Jan-1996, 26 y.o., female Today's Date: 03/18/2022  PCP: Idamae Schuller, MD REFERRING PROVIDER: Idamae Schuller, MD   PT End of Session - 03/18/22 1524     Visit Number 14    Number of Visits 17    Date for PT Re-Evaluation 03/27/22    PT Start Time 7829    PT Stop Time 1610    PT Time Calculation (min) 40 min    Activity Tolerance Patient tolerated treatment well    Behavior During Therapy [facility] for tasks assessed/performed                      Past Medical History:  Diagnosis Date   Anxiety    Asthma    Attention deficit hyperactivity disorder    Chest pain    Chronic constipation    Depression    Morbid obesity ([place]) 03/02/2020   Past Surgical History:  Procedure Laterality Date   adenoid removal     BIOPSY  08/12/2021   Procedure: BIOPSY;  Surgeon: Irving Copas., MD;  Location: [facility];  Service: Gastroenterology;;  EGD and COLON   COLONOSCOPY WITH PROPOFOL N/A 08/12/2021   Procedure: COLONOSCOPY WITH PROPOFOL;  Surgeon: Irving Copas., MD;  Location: [facility];  Service: Gastroenterology;  Laterality: N/A;   ESOPHAGOGASTRODUODENOSCOPY (EGD) WITH PROPOFOL N/A 08/12/2021   Procedure: ESOPHAGOGASTRODUODENOSCOPY (EGD) WITH PROPOFOL;  Surgeon: Rush Landmark Telford Nab., MD;  Location: [facility];  Service: Gastroenterology;  Laterality: N/A;   EXTERNAL EAR SURGERY     POLYPECTOMY  08/12/2021   Procedure: POLYPECTOMY;  Surgeon: Mansouraty, Telford Nab., MD;  Location: [facility];  Service: Gastroenterology;;   [place] EXTRACTION     Patient Active Problem List   Diagnosis Date Noted   Irritable bowel syndrome with both constipation and diarrhea 02/01/2022   Irritable bowel syndrome with diarrhea 12/15/2021   Generalized abdominal pain 12/15/2021   Class 3 severe obesity without serious comorbidity with body mass index (BMI) of 60.0 to  69.9 in adult ([place]) 12/15/2021   Low back strain 56/21/3086   Folliculitis 57/84/6962   Chronic diarrhea 03/08/2021   Abdominal pain, epigastric 03/08/2021   Bloating 03/08/2021   Anxiety and depression 04/02/2020   Chronic lower back pain 04/02/2020   Urinary frequency 04/02/2020   Healthcare maintenance 03/02/2020   Morbid obesity ([place]) 03/02/2020   Allergic rhinitis 03/02/2020   Functional dyspepsia 02/28/2020   Chronic constipation     REFERRING DIAG:  S39.012A (ICD-10-CM) - Back strain, initial encounter  THERAPY DIAG:  Chronic right-sided low back pain, unspecified whether sciatica present  Muscle weakness (generalized)  Other abnormalities of gait and mobility  PERTINENT HISTORY:  Anxiety and depression  PRECAUTIONS:  None  SUBJECTIVE:  Pt presents to PT with no current reports of pain. Has been compliant with HEP with no adverse effect. Pt is ready to begin PT at this time.   Pain: Are you having pain?  No: NPRS: 0/10 (8/10 at worst) especially with standing too long Pain location: lower back PAIN TYPE: aching and sharp Pain description: intermittent  Aggravating factors: ice/heat; crouching Relieving factors: standing >5 min, fwd bending, walking   OBJECTIVE:     PATIENT SURVEYS:  Modified Oswestry: 42% disability - 02/13/2022   MUSCLE LENGTH: Hamstrings: Right WFL deg; Left WFL deg   POSTURE:  Large body habitus; increased lumbar lordosis   PALPATION: TTP to R sided lumbar paraspinals  LUMBARAROM/PROM   A/PROM A/PROM  12/09/2021  Flexion Increased pain with repeated flexion  Extension Extension reduced; pain with repated motions   (Blank rows = not tested)   LE MMT:   MMT Right 12/09/2021 Left 12/09/2021 Right 02/25/22 Left 02/25/22  Hip flexion  5/5 5/5    Hip extension        Hip abduction 4/5 4/5 4+/5 4+/5  Hip adduction 3+/5 3+/5    Hip external rotation        Hip internal rotation        Knee extension 5/5 5/5    Knee flexion  5/5 5/5    Ankle dorsiflexion         Ankle plantarflexion        Ankle inversion        Ankle eversion        Grossly        (Blank rows = not tested)   LUMBAR SPECIAL TESTS:  Straight leg raise test: Positive Left   FUNCTIONAL TESTS:  30 seconds chair stand test: 7 reps   GAIT: Distance walked: 38f Assistive device utilized: None Level of assistance: Complete Independence Comments: decreased gait speed   TODAY'S TREATMENT  [facility] Adult PT Treatment:                                                DATE: 03/18/2022 Therapeutic Exercise: NuStep lvl 6 LE only x 4 min while taking subjective Standing hip abd/ext 2x10 each 50# Chop x 10 BIL 13# Lift 7# x 10 each FM deadlift with row 2x10 20# Paloff press 2x10 13# Shoulder extension 10# x 15 Leg press 3x10 100# - hips externally rotated Step ups x 10 8 in step  [facility]                                                DATE: 03/[facility]/20[facility] Therapeutic Exercise: NuStep lvl 6 LE only x 3 min while taking subjective Standing hip abd/ext 2x10 each 50# Chop x 10 BIL 13# Lift 7# x 10 each Paloff press 2x10 13# Shoulder extension 10# x 15 Leg press 3x10 100# - hips externally rotated Deadlift 2x10 20# (inc back pain second set)  STS x10 holding 15# KB  [facility] Adult PT Treatment:                                                DATE: 03/06/2022 Therapeutic Exercise: NuStep lvl 6 LE only x 5 min while taking subjective Standing hip abd/ext 2x10 each 50# Chop x 10 BIL 13# Lift 7# x 10 each Paloff press 2x10 13# Shoulder extension 10# 2x10 Leg press 3x10 100# - hips externally rotated STS x10 holding 15# KB  PATIENT EDUCATION:  Education details: ODI, HEP, POC Person educated: Patient Education method: Explanation, Demonstration, and Handouts Education comprehension: verbalized understanding and returned demonstration     HOME EXERCISE PROGRAM: Access Code: [email or web address]: [email or web address] Date:  12/17/2021 Prepared by: DOctavio Manns Exercises Supine Posterior Pelvic Tilt - 1 x daily - 7 x weekly - 2 sets -  10 reps - 5 sec hold Supine Active Straight Leg Raise - 1 x daily - 7 x weekly - 2 sets - 10 reps Seated Hip Abduction with Resistance - 1 x daily - 7 x weekly - 3 sets - 20 reps - green tband hold Seated Hip Adduction Squeeze with Ball - 1 x daily - 7 x weekly - 3 sets - 10 reps - 5 sec hold Sit to Stand - 1 x daily - 7 x weekly - 2 sets - 10 reps    ASSESSMENT:   CLINICAL IMPRESSION: Pt was again able to complete all prescribed exercises with no adverse effect or increase in pain. Therapy continued to progress core and proximal hip strength, with pt showing continued improvement in strength and functional ability. Pt is progressing well with therapy and will continue to be seen and progressed as able per POC.   OBJECTIVE IMPAIRMENTS decreased activity tolerance, decreased endurance, decreased ROM, decreased strength, improper body mechanics, and pain.    ACTIVITY LIMITATIONS cleaning, community activity, driving, and yard work.    PERSONAL FACTORS Fitness, Time since onset of injury/illness/exacerbation, and 1-2 comorbidities: anxiety and depression  are also affecting patient's functional outcome.      GOALS: Goals reviewed with patient? Yes   SHORT TERM GOALS:   Pt will be compliant and knowledgeable with initial HEP for improved comfort and carryover Baseline: initial HEP given Target date: 12/30/2021 Goal status: MET   2.  Pt will self report low back pain no greater than 6/10 for improved comfort and functional ability Baseline: 10/10 at worst Target date: 12/30/2021 Goal status: MET   LONG TERM GOALS:   Pt will self report low back pain no greater than 3/10 for improved comfort and functional ability Baseline: 10/10 at worst Target date: 03/27/2022 Goal status: ONGOING   2.  Pt will decrease ODI score to no greater than 38% disability as proxy for  functional improvement Baseline: 50% disability  12/24/2021 - 48% disability 02/13/2022: 42% disability Target date: 03/27/2022 Goal status: PARTIALLY MET   3.  Pt will be able to lift 50lb via deadlift from with no increase in LBP for improved functional ability with ADL performance.  Baseline: unable Target date: 03/27/2022 Goal status: ONGOING   4.  Pt will increase 30 Second Sit to Stand rep count to no less than 10 reps for improved balance, strength, and functional mobility Baseline: 7 reps 02/13/2022: 7 reps Target date: 03/27/2022 Goal status: ONGOING   PLAN: PT FREQUENCY: 2x/week   PT DURATION: 6 weeks   PLANNED INTERVENTIONS: Therapeutic exercises, Therapeutic activity, Neuromuscular re-education, Balance training, Gait training, Patient/Family education, Joint mobilization, Aquatic Therapy, Dry Needling, Cryotherapy, Moist heat, and Manual therapy   PLAN FOR NEXT SESSION: assess HEP response; proximal hip and core strengthening    Ward Chatters, PT 03/18/2022, 4:19 PM

## 2022-03-20 ENCOUNTER — Ambulatory Visit: Payer: Medicaid Other

## 2022-03-20 DIAGNOSIS — G8929 Other chronic pain: Secondary | ICD-10-CM

## 2022-03-20 DIAGNOSIS — R2689 Other abnormalities of gait and mobility: Secondary | ICD-10-CM

## 2022-03-20 DIAGNOSIS — M545 Low back pain, unspecified: Secondary | ICD-10-CM | POA: Diagnosis not present

## 2022-03-20 DIAGNOSIS — M6281 Muscle weakness (generalized): Secondary | ICD-10-CM

## 2022-03-20 NOTE — Therapy (Signed)
OUTPATIENT PHYSICAL THERAPY TREATMENT NOTE   Patient Name: Kathryn Andrews MRN: [id number] DOB:1996-09-16, 26 y.o., female Today's Date: 03/20/2022  PCP: Idamae Schuller, MD REFERRING PROVIDER: Idamae Schuller, MD   PT End of Session - 03/20/22 1507     Visit Number 15    Number of Visits 17    Date for PT Re-Evaluation 03/27/22    PT Start Time 1525    PT Stop Time 1605    PT Time Calculation (min) 40 min    Activity Tolerance Patient tolerated treatment well    Behavior During Therapy [facility] for tasks assessed/performed                       Past Medical History:  Diagnosis Date   Anxiety    Asthma    Attention deficit hyperactivity disorder    Chest pain    Chronic constipation    Depression    Morbid obesity ([place]) 03/02/2020   Past Surgical History:  Procedure Laterality Date   adenoid removal     BIOPSY  08/12/2021   Procedure: BIOPSY;  Surgeon: Irving Copas., MD;  Location: [facility];  Service: Gastroenterology;;  EGD and COLON   COLONOSCOPY WITH PROPOFOL N/A 08/12/2021   Procedure: COLONOSCOPY WITH PROPOFOL;  Surgeon: Irving Copas., MD;  Location: [facility];  Service: Gastroenterology;  Laterality: N/A;   ESOPHAGOGASTRODUODENOSCOPY (EGD) WITH PROPOFOL N/A 08/12/2021   Procedure: ESOPHAGOGASTRODUODENOSCOPY (EGD) WITH PROPOFOL;  Surgeon: Rush Landmark Telford Nab., MD;  Location: [facility];  Service: Gastroenterology;  Laterality: N/A;   EXTERNAL EAR SURGERY     POLYPECTOMY  08/12/2021   Procedure: POLYPECTOMY;  Surgeon: Mansouraty, Telford Nab., MD;  Location: [facility];  Service: Gastroenterology;;   [place] EXTRACTION     Patient Active Problem List   Diagnosis Date Noted   Irritable bowel syndrome with both constipation and diarrhea 02/01/2022   Irritable bowel syndrome with diarrhea 12/15/2021   Generalized abdominal pain 12/15/2021   Class 3 severe obesity without serious comorbidity with body mass index (BMI) of 60.0 to  69.9 in adult ([place]) 12/15/2021   Low back strain 62/26/3335   Folliculitis 45/62/5638   Chronic diarrhea 03/08/2021   Abdominal pain, epigastric 03/08/2021   Bloating 03/08/2021   Anxiety and depression 04/02/2020   Chronic lower back pain 04/02/2020   Urinary frequency 04/02/2020   Healthcare maintenance 03/02/2020   Morbid obesity ([place]) 03/02/2020   Allergic rhinitis 03/02/2020   Functional dyspepsia 02/28/2020   Chronic constipation     REFERRING DIAG:  S39.012A (ICD-10-CM) - Back strain, initial encounter  THERAPY DIAG:  Chronic right-sided low back pain, unspecified whether sciatica present  Muscle weakness (generalized)  Other abnormalities of gait and mobility  PERTINENT HISTORY:  Anxiety and depression  PRECAUTIONS:  None  SUBJECTIVE:  Pt presents to PT with no current reports of pain or discomfort. She has been compliant with HEP with no adverse effect. Pt is ready to begin PT at this time.   Pain: Are you having pain?  No: NPRS: 0/10 (8/10 at worst) especially with standing too long Pain location: lower back PAIN TYPE: aching and sharp Pain description: intermittent  Aggravating factors: ice/heat; crouching Relieving factors: standing >5 min, fwd bending, walking   OBJECTIVE:     PATIENT SURVEYS:  Modified Oswestry: 42% disability - 02/13/2022   MUSCLE LENGTH: Hamstrings: Right WFL deg; Left WFL deg   POSTURE:  Large body habitus; increased lumbar lordosis   PALPATION: TTP to R  sided lumbar paraspinals   LUMBARAROM/PROM   A/PROM A/PROM  12/09/2021  Flexion Increased pain with repeated flexion  Extension Extension reduced; pain with repated motions   (Blank rows = not tested)   LE MMT:   MMT Right 12/09/2021 Left 12/09/2021 Right 02/25/22 Left 02/25/22  Hip flexion  5/5 5/5    Hip extension        Hip abduction 4/5 4/5 4+/5 4+/5  Hip adduction 3+/5 3+/5    Hip external rotation        Hip internal rotation        Knee extension 5/5 5/5     Knee flexion 5/5 5/5    Ankle dorsiflexion         Ankle plantarflexion        Ankle inversion        Ankle eversion        Grossly        (Blank rows = not tested)   LUMBAR SPECIAL TESTS:  Straight leg raise test: Positive Left   FUNCTIONAL TESTS:  30 seconds chair stand test: 7 reps   GAIT: Distance walked: 46ft Assistive device utilized: None Level of assistance: Complete Independence Comments: decreased gait speed   TODAY'S TREATMENT  [facility] Adult PT Treatment:                                                DATE: 03/20/2022 Therapeutic Exercise: NuStep lvl 6 LE only x 4 min while taking subjective Standing hip abd/ext 2x10 each 50# Chop x 10 BIL 13# Lift 7# x 10 each FM deadlift with row 2x10 20# Paloff press 2x10 13# Shoulder extension 10# 2x15 Leg press 3x10 100# - hips externally rotated Step ups x 10 8 in step  [facility] Adult PT Treatment:                                                DATE: 03/18/2022 Therapeutic Exercise: NuStep lvl 6 LE only x 4 min while taking subjective Standing hip abd/ext 2x10 each 50# Chop x 10 BIL 13# Lift 7# x 10 each FM deadlift with row 2x10 20# Paloff press 2x10 13# Shoulder extension 10# x 15 Leg press 3x10 100# - hips externally rotated Step ups x 10 8 in step  [facility] Adult PT Treatment:                                                DATE: 03/11/2022 Therapeutic Exercise: NuStep lvl 6 LE only x 3 min while taking subjective Standing hip abd/ext 2x10 each 50# Chop x 10 BIL 13# Lift 7# x 10 each Paloff press 2x10 13# Shoulder extension 10# x 15 Leg press 3x10 100# - hips externally rotated Deadlift 2x10 20# (inc back pain second set)  STS x10 holding 15# KB  PATIENT EDUCATION:  Education details: ODI, HEP, POC Person educated: Patient Education method: Explanation, Demonstration, and Handouts Education comprehension: verbalized understanding and returned demonstration     HOME EXERCISE PROGRAM: Access Code: [email or web address] URL:  [email or web address] Date: 12/17/2021 Prepared by: Octavio Manns  Exercises Supine Posterior  Pelvic Tilt - 1 x daily - 7 x weekly - 2 sets - 10 reps - 5 sec hold Supine Active Straight Leg Raise - 1 x daily - 7 x weekly - 2 sets - 10 reps Seated Hip Abduction with Resistance - 1 x daily - 7 x weekly - 3 sets - 20 reps - green tband hold Seated Hip Adduction Squeeze with Ball - 1 x daily - 7 x weekly - 3 sets - 10 reps - 5 sec hold Sit to Stand - 1 x daily - 7 x weekly - 2 sets - 10 reps    ASSESSMENT:   CLINICAL IMPRESSION: Pt was able to complete prescribed exercises with no adverse effect or increase in pain. Therapy focused on improving core and proximal hip strength for decreasing pain and improving functional ability. She continues to benefit from skilled PT services in order to decrease pain and improve function.    OBJECTIVE IMPAIRMENTS decreased activity tolerance, decreased endurance, decreased ROM, decreased strength, improper body mechanics, and pain.    ACTIVITY LIMITATIONS cleaning, community activity, driving, and yard work.    PERSONAL FACTORS Fitness, Time since onset of injury/illness/exacerbation, and 1-2 comorbidities: anxiety and depression  are also affecting patient's functional outcome.      GOALS: Goals reviewed with patient? Yes   SHORT TERM GOALS:   Pt will be compliant and knowledgeable with initial HEP for improved comfort and carryover Baseline: initial HEP given Target date: 12/30/2021 Goal status: MET   2.  Pt will self report low back pain no greater than 6/10 for improved comfort and functional ability Baseline: 10/10 at worst Target date: 12/30/2021 Goal status: MET   LONG TERM GOALS:   Pt will self report low back pain no greater than 3/10 for improved comfort and functional ability Baseline: 10/10 at worst Target date: 03/27/2022 Goal status: ONGOING   2.  Pt will decrease ODI score to no greater than 38% disability as proxy  for functional improvement Baseline: 50% disability  12/24/2021 - 48% disability 02/13/2022: 42% disability Target date: 03/27/2022 Goal status: PARTIALLY MET   3.  Pt will be able to lift 50lb via deadlift from with no increase in LBP for improved functional ability with ADL performance.  Baseline: unable Target date: 03/27/2022 Goal status: ONGOING   4.  Pt will increase 30 Second Sit to Stand rep count to no less than 10 reps for improved balance, strength, and functional mobility Baseline: 7 reps 02/13/2022: 7 reps Target date: 03/27/2022 Goal status: ONGOING   PLAN: PT FREQUENCY: 2x/week   PT DURATION: 6 weeks   PLANNED INTERVENTIONS: Therapeutic exercises, Therapeutic activity, Neuromuscular re-education, Balance training, Gait training, Patient/Family education, Joint mobilization, Aquatic Therapy, Dry Needling, Cryotherapy, Moist heat, and Manual therapy   PLAN FOR NEXT SESSION: assess HEP response; proximal hip and core strengthening    Ward Chatters, PT 03/20/2022, 4:12 PM

## 2022-03-24 NOTE — Therapy (Signed)
OUTPATIENT PHYSICAL THERAPY TREATMENT NOTE   Patient Name: Kathryn Andrews MRN: [id number] DOB:1996/01/25, 26 y.o., female Today's Date: 03/25/2022  PCP: Idamae Schuller, MD REFERRING PROVIDER: Idamae Schuller, MD   PT End of Session - 03/25/22 1530     Visit Number 16    Number of Visits 17    Date for PT Re-Evaluation 03/27/22    PT Start Time 1530    PT Stop Time 1608    PT Time Calculation (min) 38 min    Activity Tolerance Patient tolerated treatment well    Behavior During Therapy [facility] for tasks assessed/performed                        Past Medical History:  Diagnosis Date   Anxiety    Asthma    Attention deficit hyperactivity disorder    Chest pain    Chronic constipation    Depression    Morbid obesity ([place]) 03/02/2020   Past Surgical History:  Procedure Laterality Date   adenoid removal     BIOPSY  08/12/2021   Procedure: BIOPSY;  Surgeon: Irving Copas., MD;  Location: [facility];  Service: Gastroenterology;;  EGD and COLON   COLONOSCOPY WITH PROPOFOL N/A 08/12/2021   Procedure: COLONOSCOPY WITH PROPOFOL;  Surgeon: Irving Copas., MD;  Location: [facility];  Service: Gastroenterology;  Laterality: N/A;   ESOPHAGOGASTRODUODENOSCOPY (EGD) WITH PROPOFOL N/A 08/12/2021   Procedure: ESOPHAGOGASTRODUODENOSCOPY (EGD) WITH PROPOFOL;  Surgeon: Rush Landmark Telford Nab., MD;  Location: [facility];  Service: Gastroenterology;  Laterality: N/A;   EXTERNAL EAR SURGERY     POLYPECTOMY  08/12/2021   Procedure: POLYPECTOMY;  Surgeon: Mansouraty, Telford Nab., MD;  Location: [facility];  Service: Gastroenterology;;   [place] EXTRACTION     Patient Active Problem List   Diagnosis Date Noted   Irritable bowel syndrome with both constipation and diarrhea 02/01/2022   Irritable bowel syndrome with diarrhea 12/15/2021   Generalized abdominal pain 12/15/2021   Class 3 severe obesity without serious comorbidity with body mass index (BMI) of 60.0 to  69.9 in adult ([place]) 12/15/2021   Low back strain 62/83/1517   Folliculitis 61/60/7371   Chronic diarrhea 03/08/2021   Abdominal pain, epigastric 03/08/2021   Bloating 03/08/2021   Anxiety and depression 04/02/2020   Chronic lower back pain 04/02/2020   Urinary frequency 04/02/2020   Healthcare maintenance 03/02/2020   Morbid obesity ([place]) 03/02/2020   Allergic rhinitis 03/02/2020   Functional dyspepsia 02/28/2020   Chronic constipation     REFERRING DIAG:  S39.012A (ICD-10-CM) - Back strain, initial encounter  THERAPY DIAG:  Chronic right-sided low back pain, unspecified whether sciatica present  Muscle weakness (generalized)  Other abnormalities of gait and mobility  PERTINENT HISTORY:  Anxiety and depression  PRECAUTIONS:  None  SUBJECTIVE:  Pt presents to PT with no current reports of back pain or discomfort. Has been compliant with HEP with no adverse effect. Pt is ready to begin PT at this time.   Pain: Are you having pain?  No: NPRS: 0/10 (8/10 at worst) especially with standing too long Pain location: lower back PAIN TYPE: aching and sharp Pain description: intermittent  Aggravating factors: ice/heat; crouching Relieving factors: standing >5 min, fwd bending, walking   OBJECTIVE:     PATIENT SURVEYS:  Modified Oswestry: 42% disability - 02/13/2022   MUSCLE LENGTH: Hamstrings: Right WFL deg; Left WFL deg   POSTURE:  Large body habitus; increased lumbar lordosis   PALPATION: TTP to  R sided lumbar paraspinals   LUMBARAROM/PROM   A/PROM A/PROM  12/09/2021  Flexion Increased pain with repeated flexion  Extension Extension reduced; pain with repated motions   (Blank rows = not tested)   LE MMT:   MMT Right 12/09/2021 Left 12/09/2021 Right 02/25/22 Left 02/25/22  Hip flexion  5/5 5/5    Hip extension        Hip abduction 4/5 4/5 4+/5 4+/5  Hip adduction 3+/5 3+/5    Hip external rotation        Hip internal rotation        Knee extension 5/5 5/5     Knee flexion 5/5 5/5    Ankle dorsiflexion         Ankle plantarflexion        Ankle inversion        Ankle eversion        Grossly        (Blank rows = not tested)   LUMBAR SPECIAL TESTS:  Straight leg raise test: Positive Left   FUNCTIONAL TESTS:  30 seconds chair stand test: 7 reps   GAIT: Distance walked: 36f Assistive device utilized: None Level of assistance: Complete Independence Comments: decreased gait speed   TODAY'S TREATMENT  [facility] Adult PT Treatment:                                                DATE: 03/25/2022 Therapeutic Exercise: NuStep lvl 6 LE only x 4 min while taking subjective Standing hip abd/ext 2x10 each 50# Chop x 10 BIL 13# Lift 10# x 10 each Row 2x10 23# Paloff press 2x10 13# Shoulder extension 23# 2x10 Leg press 3x10 100# - hips externally rotated Step ups x 10 8 in step  [facility]                                                [facility]: [facility] Therapeutic Exercise: NuStep lvl 6 LE only x 4 min while taking subjective Standing hip abd/ext 2x10 each 50# Chop x 10 BIL 13# Lift 7# x 10 each FM deadlift with row 2x10 20# Paloff press 2x10 13# Shoulder extension 10# 2x15 Leg press 3x10 100# - hips externally rotated Step ups x 10 8 in step  [facility]                                                DATE: 03/18/2022 Therapeutic Exercise: NuStep lvl 6 LE only x 4 min while taking subjective Standing hip abd/ext 2x10 each 50# Chop x 10 BIL 13# Lift 7# x 10 each FM deadlift with row 2x10 20# Paloff press 2x10 13# Shoulder extension 10# x 15 Leg press 3x10 100# - hips externally rotated Step ups x 10 8 in step  PATIENT EDUCATION:  Education details: ODI, HEP, POC Person educated: Patient Education method: Explanation, Demonstration, and Handouts Education comprehension: verbalized understanding and returned demonstration     HOME EXERCISE PROGRAM: Access Code: [email or web address]:  [email or web address] Date: 12/17/2021 Prepared by: DOctavio Manns Exercises Supine Posterior Pelvic Tilt - 1  x daily - 7 x weekly - 2 sets - 10 reps - 5 sec hold Supine Active Straight Leg Raise - 1 x daily - 7 x weekly - 2 sets - 10 reps Seated Hip Abduction with Resistance - 1 x daily - 7 x weekly - 3 sets - 20 reps - green tband hold Seated Hip Adduction Squeeze with Ball - 1 x daily - 7 x weekly - 3 sets - 10 reps - 5 sec hold Sit to Stand - 1 x daily - 7 x weekly - 2 sets - 10 reps    ASSESSMENT:   CLINICAL IMPRESSION: Pt was able to complete all prescribed exercises with no adverse effect or increase in pain. Therapy today continued to progress core/back and proximal hip strength in order to decrease pain and improve mobility.    OBJECTIVE IMPAIRMENTS decreased activity tolerance, decreased endurance, decreased ROM, decreased strength, improper body mechanics, and pain.    ACTIVITY LIMITATIONS cleaning, community activity, driving, and yard work.    PERSONAL FACTORS Fitness, Time since onset of injury/illness/exacerbation, and 1-2 comorbidities: anxiety and depression  are also affecting patient's functional outcome.      GOALS: Goals reviewed with patient? Yes   SHORT TERM GOALS:   Pt will be compliant and knowledgeable with initial HEP for improved comfort and carryover Baseline: initial HEP given Target date: 12/30/2021 Goal status: MET   2.  Pt will self report low back pain no greater than 6/10 for improved comfort and functional ability Baseline: 10/10 at worst Target date: 12/30/2021 Goal status: MET   LONG TERM GOALS:   Pt will self report low back pain no greater than 3/10 for improved comfort and functional ability Baseline: 10/10 at worst Target date: 03/27/2022 Goal status: ONGOING   2.  Pt will decrease ODI score to no greater than 38% disability as proxy for functional improvement Baseline: 50% disability  12/24/2021 - 48%  disability 02/13/2022: 42% disability Target date: 03/27/2022 Goal status: PARTIALLY MET   3.  Pt will be able to lift 50lb via deadlift from with no increase in LBP for improved functional ability with ADL performance.  Baseline: unable Target date: 03/27/2022 Goal status: ONGOING   4.  Pt will increase 30 Second Sit to Stand rep count to no less than 10 reps for improved balance, strength, and functional mobility Baseline: 7 reps 02/13/2022: 7 reps Target date: 03/27/2022 Goal status: ONGOING   PLAN: PT FREQUENCY: 2x/week   PT DURATION: 6 weeks   PLANNED INTERVENTIONS: Therapeutic exercises, Therapeutic activity, Neuromuscular re-education, Balance training, Gait training, Patient/Family education, Joint mobilization, Aquatic Therapy, Dry Needling, Cryotherapy, Moist heat, and Manual therapy   PLAN FOR NEXT SESSION: assess HEP response; proximal hip and core strengthening    Ward Chatters, PT 03/25/2022, 4:08 PM

## 2022-03-25 ENCOUNTER — Ambulatory Visit: Payer: Medicaid Other

## 2022-03-25 DIAGNOSIS — M545 Low back pain, unspecified: Secondary | ICD-10-CM | POA: Diagnosis not present

## 2022-03-25 DIAGNOSIS — M6281 Muscle weakness (generalized): Secondary | ICD-10-CM

## 2022-03-25 DIAGNOSIS — R2689 Other abnormalities of gait and mobility: Secondary | ICD-10-CM

## 2022-03-26 ENCOUNTER — Ambulatory Visit (INDEPENDENT_AMBULATORY_CARE_PROVIDER_SITE_OTHER): Payer: Medicaid Other | Admitting: Gastroenterology

## 2022-03-26 ENCOUNTER — Encounter: Payer: Self-pay | Admitting: Gastroenterology

## 2022-03-26 VITALS — BP 124/82 | HR 94 | Ht 68.0 in | Wt >= 6400 oz

## 2022-03-26 DIAGNOSIS — K580 Irritable bowel syndrome with diarrhea: Secondary | ICD-10-CM | POA: Diagnosis not present

## 2022-03-26 DIAGNOSIS — K219 Gastro-esophageal reflux disease without esophagitis: Secondary | ICD-10-CM | POA: Diagnosis not present

## 2022-03-26 MED ORDER — RIFAXIMIN 550 MG PO TABS: 550.0000 mg | ORAL_TABLET | Freq: Three times a day (TID) | ORAL | 0 refills | Status: DC

## 2022-03-26 NOTE — Progress Notes (Unsigned)
GASTROENTEROLOGY OUTPATIENT CLINIC VISIT   Primary Care Provider Gwenevere Abbot, MD [address] [place] [address] [phone]  Patient Profile: Kathryn Andrews is a 26 y.o. female with a pmh significant for morbid obesity, asthma, MDD/anxiety, ADHD, ?IBS.  The patient presents to the [facility] for an evaluation and management of problem(s) noted below:  Problem List 1. Irritable bowel syndrome with diarrhea   2. Morbid obesity (HCC)   3. Gastroesophageal reflux disease, unspecified whether esophagitis present      History of Present Illness Please see prior notes for full details of HPI.  Interval History The patient returns for follow-up.  She is unaccompanied today.  She states that she did not pick up the Xifaxan that we had ordered her last week.  Her symptoms of diarrhea persist between 2 and 5 times per day.  This does not keep her from doing her activities of daily living.  She did run out of her other medications however and has not called her primary care provider about this yet.  She denies any blood in her stools.  She has not had any significant weight loss.  She has not heard from the bariatric surgery group that we have referred her to.    GI Review of Systems Positive as above including bloating at times Negative for dysphagia, odynophagia, melena, hematochezia  Review of Systems General: Denies fevers/chills/weight loss unintentionally Cardiovascular: Denies chest pain Pulmonary: Denies shortness of breath Gastroenterological: See HPI Genitourinary: Denies darkened urine Hematological: Denies easy bruising/bleeding Dermatological: Denies jaundice Psychological: Mood is stable per her report   Medications Current Outpatient Medications  Medication Sig Dispense Refill   DULoxetine (CYMBALTA) 30 MG capsule Take 2 capsules (60 mg total) by mouth daily. 60 capsule 3   esomeprazole (NEXIUM) 40 MG capsule Take 40 mg by mouth daily  at 12 noon.     ibuprofen (ADVIL) 400 MG tablet TAKE 1 TABLET (400 MG TOTAL) BY MOUTH 2 (TWO) TIMES DAILY AS NEEDED FOR MODERATE PAIN. 30 tablet 0   loratadine (CLARITIN) 10 MG tablet TAKE 1 TABLET BY MOUTH EVERY DAY 90 tablet 0   VENTOLIN HFA 108 (90 Base) MCG/ACT inhaler TAKE 2 PUFFS BY MOUTH EVERY 6 HOURS AS NEEDED FOR WHEEZE OR SHORTNESS OF BREATH 18 each 2   rifaximin (XIFAXAN) 550 MG TABS tablet Take 1 tablet (550 mg total) by mouth 3 (three) times daily. 42 tablet 0   No current facility-administered medications for this visit.    Allergies No Known Allergies  Histories Past Medical History:  Diagnosis Date   Anxiety    Asthma    Attention deficit hyperactivity disorder    Chest pain    Chronic constipation    Depression    Morbid obesity (HCC) 03/02/2020   Past Surgical History:  Procedure Laterality Date   adenoid removal     BIOPSY  08/12/2021   Procedure: BIOPSY;  Surgeon: Lemar Lofty., MD;  Location: [facility];  Service: Gastroenterology;;  EGD and COLON   COLONOSCOPY WITH PROPOFOL N/A 08/12/2021   Procedure: COLONOSCOPY WITH PROPOFOL;  Surgeon: Lemar Lofty., MD;  Location: [facility];  Service: Gastroenterology;  Laterality: N/A;   ESOPHAGOGASTRODUODENOSCOPY (EGD) WITH PROPOFOL N/A 08/12/2021   Procedure: ESOPHAGOGASTRODUODENOSCOPY (EGD) WITH PROPOFOL;  Surgeon: Meridee Score Netty Starring., MD;  Location: [facility];  Service: Gastroenterology;  Laterality: N/A;   EXTERNAL EAR SURGERY     POLYPECTOMY  08/12/2021   Procedure: POLYPECTOMY;  Surgeon: Mansouraty, Netty Starring., MD;  Location: [facility];  Service: Gastroenterology;;   WISDOM TOOTH EXTRACTION     Social History   Socioeconomic History   Marital status: Single    Spouse name: Not on file   Number of children: Not on file   Years of education: Not on file   Highest education level: Not on file  Occupational History   Not on file  Tobacco Use   Smoking status: Every Day     Types: E-cigarettes    Start date: 02/28/2019   Smokeless tobacco: Never   Tobacco comments:    Vapes   Vaping Use   Vaping Use: Never used  Substance and Sexual Activity   Alcohol use: Yes    Comment: occasionally   Drug use: Yes    Types: Marijuana   Sexual activity: Not on file  Other Topics Concern   Not on file  Social History Narrative   Not on file   Social Determinants of Health   Financial Resource Strain: Not on file  Food Insecurity: Not on file  Transportation Needs: Not on file  Physical Activity: Not on file  Stress: Not on file  Social Connections: Not on file  Intimate Partner Violence: Not on file   Family History  Problem Relation Age of Onset   Diabetes Paternal Grandmother    CAD Paternal Grandmother    Hypertension Paternal Grandmother    Hyperlipidemia Paternal Grandmother    Colon cancer Neg Hx    Esophageal cancer Neg Hx    Inflammatory bowel disease Neg Hx    Liver disease Neg Hx    Pancreatic cancer Neg Hx    Rectal cancer Neg Hx    Stomach cancer Neg Hx    I have reviewed her medical, social, and family history in detail and updated the electronic medical record as necessary.    PHYSICAL EXAMINATION  BP 124/82   Pulse 94   Ht 5\' 8"  (1.727 m)   Wt (!) 421 lb (191 kg)   BMI 64.01 kg/m  Wt Readings from Last 3 Encounters:  03/26/22 (!) 421 lb (191 kg)  01/28/22 (!) 416 lb (188.7 kg)  12/10/21 (!) 407 lb 2 oz (184.7 kg)  GEN: NAD, appears stated age, doesn't appear chronically ill PSYCH: Cooperative, without pressured speech EYE: Conjunctivae pink, sclerae anicteric ENT: MMM CV: Nontachycardic RESP: No audible wheezing GI: Obese, rounded, no rebound MSK/EXT: Slight lower extremity edema present SKIN: No jaundice NEURO:  Alert & Oriented x 3, no focal deficits   REVIEW OF DATA  I reviewed the following data at the time of this encounter:  GI Procedures and Studies  No new studies to review  Laboratory Studies  Reviewed  those in epic  Imaging Studies  No relevant studies to review   ASSESSMENT  [name] is a 26 y.o. female with a pmh significant for morbid obesity, asthma, MDD/anxiety, ADHD, ?IBS.  The patient is seen today for evaluation and management of:  1. Irritable bowel syndrome with diarrhea   2. Morbid obesity (HCC)   3. Gastroesophageal reflux disease, unspecified whether esophagitis present    The patient is hemodynamically stable.  Clinically she continues to experience issues of alternating bowel habits though mostly diarrhea.  She never picked up her Xifaxan treatment, and we have confirmed that with the pharmacy.  She will pick that up today.  SIBO breath testing will be considered in the near future depending on how she does with Xifaxan.  We found that they did  try to call her and actually she did not appear at her scheduled bariatric surgery visit.  We will give the number to the patient so she can call and try to reschedule her bariatric referral.  All patient questions were answered to the best of my ability, and the patient agrees to the aforementioned plan of action with follow-up as indicated.   PLAN  Xifaxan 550 mg 3 times daily x14 days for IBS-D treatment Consider SIBO breath testing in future We will consider Lomotil use in future Continue PPI once daily Patient given bariatric surgery phone number to call and reschedule   No orders of the defined types were placed in this encounter.   New Prescriptions   No medications on file   Modified Medications   Modified Medication Previous Medication   RIFAXIMIN (XIFAXAN) 550 MG TABS TABLET rifaximin (XIFAXAN) 550 MG TABS tablet      Take 1 tablet (550 mg total) by mouth 3 (three) times daily.    Take 1 tablet (550 mg total) by mouth 3 (three) times daily.    Planned Follow Up Return in about 4 months (around 07/26/2022).   Total Time in Face-to-Face and in Coordination of Care for patient including independent/personal  interpretation/review of prior testing, medical history, examination, medication adjustment, communicating results with the patient directly, and documentation with the EHR is 25 minutes.   Corliss Parish, MD [facility] Office # [phone]

## 2022-03-26 NOTE — Patient Instructions (Signed)
Continue Nexium.   We have sent the following medications to your pharmacy for you to pick up at your convenience: Xifaxan( rifaximin)- Please call office and let us know if you are unable to get this medication from your pharmacy.   If you are age 28 or older, your body mass index should be between 23-30. Your Body mass index is 64.01 kg/m. If this is out of the aforementioned range listed, please consider follow up with your Primary Care Provider.  If you are age 89 or younger, your body mass index should be between 19-25. Your Body mass index is 64.01 kg/m. If this is out of the aformentioned range listed, please consider follow up with your Primary Care Provider.   ________________________________________________________  The [facility] providers would like to encourage you to use [facility] to communicate with providers for non-urgent requests or questions.  Due to long hold times on the telephone, sending your provider a message by [facility] may be a faster and more efficient way to get a response.  Please allow 48 business hours for a response.  Please remember that this is for non-urgent requests.  _______________________________________________________  Follow up in 3-4 months. Office to contact you with an appointment.   Thank you for choosing me and [facility].  Dr. Meridee Score

## 2022-03-27 ENCOUNTER — Ambulatory Visit: Payer: Medicaid Other

## 2022-03-27 DIAGNOSIS — M545 Low back pain, unspecified: Secondary | ICD-10-CM | POA: Diagnosis not present

## 2022-03-27 DIAGNOSIS — G8929 Other chronic pain: Secondary | ICD-10-CM

## 2022-03-27 DIAGNOSIS — R2689 Other abnormalities of gait and mobility: Secondary | ICD-10-CM

## 2022-03-27 DIAGNOSIS — M6281 Muscle weakness (generalized): Secondary | ICD-10-CM

## 2022-03-27 NOTE — Therapy (Signed)
OUTPATIENT PHYSICAL THERAPY TREATMENT NOTE/DISCHARGE  PHYSICAL THERAPY DISCHARGE SUMMARY  Visits from Start of Care: 17  Current functional level related to goals / functional outcomes: See goals and objective   Remaining deficits: See goals and objective   Education / Equipment: HEP   Patient agrees to discharge. Patient goals were met. Patient is being discharged due to meeting the stated rehab goals.   Patient Name: Kathryn Andrews MRN: [id number] DOB:1996-04-14, 26 y.o., female Today's Date: 03/27/2022  PCP: Idamae Schuller, MD REFERRING PROVIDER: Idamae Schuller, MD   PT End of Session - 03/27/22 1523     Visit Number 17    Number of Visits 17    Date for PT Re-Evaluation 03/27/22    PT Start Time 1530    PT Stop Time 1600    PT Time Calculation (min) 30 min    Activity Tolerance Patient tolerated treatment well    Behavior During Therapy [facility] for tasks assessed/performed                         Past Medical History:  Diagnosis Date   Anxiety    Asthma    Attention deficit hyperactivity disorder    Chest pain    Chronic constipation    Depression    Morbid obesity ([place]) 03/02/2020   Past Surgical History:  Procedure Laterality Date   adenoid removal     BIOPSY  08/12/2021   Procedure: BIOPSY;  Surgeon: Irving Copas., MD;  Location: [facility];  Service: Gastroenterology;;  EGD and COLON   COLONOSCOPY WITH PROPOFOL N/A 08/12/2021   Procedure: COLONOSCOPY WITH PROPOFOL;  Surgeon: Irving Copas., MD;  Location: [facility];  Service: Gastroenterology;  Laterality: N/A;   ESOPHAGOGASTRODUODENOSCOPY (EGD) WITH PROPOFOL N/A 08/12/2021   Procedure: ESOPHAGOGASTRODUODENOSCOPY (EGD) WITH PROPOFOL;  Surgeon: Rush Landmark Telford Nab., MD;  Location: [facility];  Service: Gastroenterology;  Laterality: N/A;   EXTERNAL EAR SURGERY     POLYPECTOMY  08/12/2021   Procedure: POLYPECTOMY;  Surgeon: Mansouraty, Telford Nab., MD;  Location: [facility];  Service: Gastroenterology;;   [place] EXTRACTION     Patient Active Problem List   Diagnosis Date Noted   Irritable bowel syndrome with both constipation and diarrhea 02/01/2022   Irritable bowel syndrome with diarrhea 12/15/2021   Generalized abdominal pain 12/15/2021   Class 3 severe obesity without serious comorbidity with body mass index (BMI) of 60.0 to 69.9 in adult ([place]) 12/15/2021   Low back strain 65/79/0383   Folliculitis 33/83/2919   Chronic diarrhea 03/08/2021   Abdominal pain, epigastric 03/08/2021   Bloating 03/08/2021   Anxiety and depression 04/02/2020   Chronic lower back pain 04/02/2020   Urinary frequency 04/02/2020   Healthcare maintenance 03/02/2020   Morbid obesity ([place]) 03/02/2020   Allergic rhinitis 03/02/2020   Functional dyspepsia 02/28/2020   Chronic constipation     REFERRING DIAG:  S39.012A (ICD-10-CM) - Back strain, initial encounter  THERAPY DIAG:  Chronic right-sided low back pain, unspecified whether sciatica present  Muscle weakness (generalized)  Other abnormalities of gait and mobility  PERTINENT HISTORY:  Anxiety and depression  PRECAUTIONS:  None  SUBJECTIVE:  Pt presents to PT with continued reports of decreased pain. She has been compliant with HEP no adverse effect. She is ready to begin PT at this time.   Pain: Are you having pain?  No: NPRS: 0/10 (8/10 at worst) especially with standing too long Pain location: lower back PAIN TYPE: aching and  sharp Pain description: intermittent  Aggravating factors: ice/heat; crouching Relieving factors: standing >5 min, fwd bending, walking   OBJECTIVE:     PATIENT SURVEYS:  Modified Oswestry: 22% disability - 03/27/2022   MUSCLE LENGTH: Hamstrings: Right WFL deg; Left WFL deg   POSTURE:  Large body habitus; increased lumbar lordosis   PALPATION: TTP to R sided lumbar paraspinals   LUMBARAROM/PROM   A/PROM A/PROM  12/09/2021  Flexion Increased pain with  repeated flexion  Extension Extension reduced; pain with repated motions   (Blank rows = not tested)   LE MMT:   MMT Right 12/09/2021 Left 12/09/2021 Right 02/25/22 Left 02/25/22  Hip flexion  5/5 5/5    Hip extension        Hip abduction 4/5 4/5 4+/5 4+/5  Hip adduction 3+/5 3+/5    Hip external rotation        Hip internal rotation        Knee extension 5/5 5/5    Knee flexion 5/5 5/5    Ankle dorsiflexion         Ankle plantarflexion        Ankle inversion        Ankle eversion        Grossly        (Blank rows = not tested)     FUNCTIONAL TESTS:  30 seconds chair stand test: 9 reps   GAIT: Distance walked: 42f Assistive device utilized: None Level of assistance: Complete [place] Comments: decreased gait speed   TODAY'S TREATMENT  [facility] Adult PT Treatment:                                                DATE: 03/27/2022 Therapeutic Exercise: Standing hip abd/ext x 15 BTB Pallof black TB x 10 each STS x 10 Row Black TB x 15 Therapeutic Activity: Assessment of tests/measures, goals, and outcomes for discharge  [facility]                                                [facility]: [facility] Therapeutic Exercise: NuStep lvl 6 LE only x 4 min while taking subjective Standing hip abd/ext 2x10 each 50# Chop x 10 BIL 13# Lift 10# x 10 each Row 2x10 23# Paloff press 2x10 13# Shoulder extension 23# 2x10 Leg press 3x10 100# - hips externally rotated Step ups x 10 8 in step  [facility]                                                DATE: 03/20/2022 Therapeutic Exercise: NuStep lvl 6 LE only x 4 min while taking subjective Standing hip abd/ext 2x10 each 50# Chop x 10 BIL 13# Lift 7# x 10 each FM deadlift with row 2x10 20# Paloff press 2x10 13# Shoulder extension 10# 2x15 Leg press 3x10 100# - hips externally rotated Step ups x 10 8 in step  PATIENT EDUCATION:  Education details: ODI, HEP, POC Person educated: Patient Education method:  Explanation, Demonstration, and Handouts Education comprehension: verbalized understanding and returned demonstration  HOME EXERCISE PROGRAM: Access Code: [email or web address] URL: [email or web address] Date: 03/27/2022 Prepared by: Octavio Manns  Exercises - Sit to Stand  - 3-4 x weekly - 2 sets - 10 reps - Standing Hip Abduction with Resistance at Ankles and Counter Support  - 3-4 x weekly - 3 sets - 15 reps - Standing Hip Extension with Resistance at Ankles and Counter Support  - 3-4 x weekly - 3 sets - 15 reps - Standing Anti-Rotation Press with Anchored Resistance  - 3-4 x weekly - 3 sets - 15 reps - Standing Shoulder Row with Anchored Resistance  - 3-4 x weekly - 3 sets - 15 reps    ASSESSMENT:   CLINICAL IMPRESSION: Pt was able to complete all prescribed exercises and demonstrated knowledge of HEP with no adverse effect. Over the course of PT treatment pt was able to greatly improve strength and functional activity tolerance while decreasing LBP. Her ODI score decreased from 50% disability at evaluation to 22% today. She should continue to improve with HEP compliance and no longer requires skilled PT services at this time.   OBJECTIVE IMPAIRMENTS decreased activity tolerance, decreased endurance, decreased ROM, decreased strength, improper body mechanics, and pain.    ACTIVITY LIMITATIONS cleaning, community activity, driving, and yard work.    PERSONAL FACTORS Fitness, Time since onset of injury/illness/exacerbation, and 1-2 comorbidities: anxiety and depression  are also affecting patient's functional outcome.      GOALS: Goals reviewed with patient? Yes   SHORT TERM GOALS:   Pt will be compliant and knowledgeable with initial HEP for improved comfort and carryover Baseline: initial HEP given Target date: 12/30/2021 Goal status: MET   2.  Pt will self report low back pain no greater than 6/10 for improved comfort and functional ability Baseline: 10/10 at  worst Target date: 12/30/2021 Goal status: MET   LONG TERM GOALS:   Pt will self report low back pain no greater than 3/10 for improved comfort and functional ability Baseline: 10/10 at worst Target date: 03/27/2022 Goal status: MET   2.  Pt will decrease ODI score to no greater than 38% disability as proxy for functional improvement Baseline: 50% disability  12/24/2021 - 48% disability 02/13/2022: 42% disability 03/27/2022: 22% disbaility Target date: 03/27/2022 Goal status: MET   3.  Pt will be able to lift 50lb via deadlift from with no increase in LBP for improved functional ability with ADL performance.  Baseline: unable Target date: 03/27/2022 Goal status: MET   4.  Pt will increase 30 Second Sit to Stand rep count to no less than 10 reps for improved balance, strength, and functional mobility Baseline: 7 reps 02/13/2022: 7 reps 03/27/2022: 9 reps Target date: 03/27/2022 Goal status: MOSTLY MET   PLAN: PT FREQUENCY: 2x/week   PT DURATION: 6 weeks   PLANNED INTERVENTIONS: Therapeutic exercises, Therapeutic activity, Neuromuscular re-education, Balance training, Gait training, Patient/Family education, Joint mobilization, Aquatic Therapy, Dry Needling, Cryotherapy, Moist heat, and Manual therapy   PLAN FOR NEXT SESSION: assess HEP response; proximal hip and core strengthening    Ward Chatters, PT 03/27/2022, 4:08 PM

## 2022-04-04 ENCOUNTER — Other Ambulatory Visit: Payer: Self-pay | Admitting: Internal Medicine

## 2022-04-04 ENCOUNTER — Ambulatory Visit: Payer: Medicaid Other

## 2022-04-23 ENCOUNTER — Other Ambulatory Visit (HOSPITAL_COMMUNITY): Payer: Self-pay

## 2022-06-04 ENCOUNTER — Telehealth: Payer: Self-pay | Admitting: *Deleted

## 2022-06-04 NOTE — Telephone Encounter (Signed)
Received call from patient's grandmother requesting appt. States patient has had 2 episodes where upon waking her, patient c/o SHOB and CP. Unable to tell how long this lasts as patient will not tell her. First episode was 2 weeks ago, and second episode was yesterday. States, "she needs to lay down all the time." GM is concerned as there is a strong family hx of heart disease. Patient's father had multiple MIs; first one at age 28. Also, states patient has "bad back pain." No relief with PT. First available appt is not till 06/13/22. GM has taken this appt. [facility] not want to take patient to ED. She is advised to take patient to UC/ED if another episode occurs before this appt. States she will.

## 2022-06-13 ENCOUNTER — Encounter: Payer: Self-pay | Admitting: Internal Medicine

## 2022-06-13 ENCOUNTER — Other Ambulatory Visit: Payer: Self-pay

## 2022-06-13 ENCOUNTER — Ambulatory Visit (INDEPENDENT_AMBULATORY_CARE_PROVIDER_SITE_OTHER): Payer: Medicaid Other | Admitting: Internal Medicine

## 2022-06-13 VITALS — BP 122/82 | HR 81 | Temp 98.4°F | Ht 68.0 in | Wt >= 6400 oz

## 2022-06-13 DIAGNOSIS — K219 Gastro-esophageal reflux disease without esophagitis: Secondary | ICD-10-CM | POA: Diagnosis not present

## 2022-06-13 DIAGNOSIS — Z0000 Encounter for general adult medical examination without abnormal findings: Secondary | ICD-10-CM

## 2022-06-13 DIAGNOSIS — F339 Major depressive disorder, recurrent, unspecified: Secondary | ICD-10-CM | POA: Diagnosis not present

## 2022-06-13 DIAGNOSIS — K582 Mixed irritable bowel syndrome: Secondary | ICD-10-CM | POA: Diagnosis not present

## 2022-06-13 DIAGNOSIS — J309 Allergic rhinitis, unspecified: Secondary | ICD-10-CM

## 2022-06-13 DIAGNOSIS — K580 Irritable bowel syndrome with diarrhea: Secondary | ICD-10-CM

## 2022-06-13 DIAGNOSIS — S39012A Strain of muscle, fascia and tendon of lower back, initial encounter: Secondary | ICD-10-CM

## 2022-06-13 DIAGNOSIS — G4733 Obstructive sleep apnea (adult) (pediatric): Secondary | ICD-10-CM | POA: Diagnosis not present

## 2022-06-13 DIAGNOSIS — F419 Anxiety disorder, unspecified: Secondary | ICD-10-CM

## 2022-06-13 DIAGNOSIS — F32A Depression, unspecified: Secondary | ICD-10-CM

## 2022-06-13 DIAGNOSIS — Z6841 Body Mass Index (BMI) 40.0 and over, adult: Secondary | ICD-10-CM

## 2022-06-13 LAB — POCT GLYCOSYLATED HEMOGLOBIN (HGB A1C): Hemoglobin A1C: 4.9 % (ref 4.0–5.6)

## 2022-06-13 LAB — GLUCOSE, CAPILLARY: Glucose-Capillary: 98 mg/dL (ref 70–99)

## 2022-06-13 MED ORDER — LORATADINE 10 MG PO TABS: 10.0000 mg | ORAL_TABLET | Freq: Every day | ORAL | 0 refills | Status: DC

## 2022-06-13 MED ORDER — ESOMEPRAZOLE MAGNESIUM 40 MG PO CPDR: 40.0000 mg | DELAYED_RELEASE_CAPSULE | Freq: Every day | ORAL | 2 refills | Status: DC

## 2022-06-13 MED ORDER — DULOXETINE HCL 30 MG PO CPEP: 30.0000 mg | ORAL_CAPSULE | Freq: Every day | ORAL | 0 refills | Status: DC

## 2022-06-13 NOTE — Progress Notes (Signed)
   CC: Follow up   HPI:  Kathryn Andrews is a 26 y.o. with medical history of GAD, MDD, Class III Obesity, IBS presenting to [facility] for follow up.  Please see problem-based list for further details, assessments, and plans.  Past Medical History:  Diagnosis Date   Anxiety    Asthma    Attention deficit hyperactivity disorder    Chest pain    Chronic constipation    Depression    Morbid obesity ([facility]) 03/02/2020      Current Outpatient Medications (Respiratory):    loratadine (CLARITIN) 10 MG tablet, TAKE 1 TABLET BY MOUTH EVERY DAY   VENTOLIN HFA 108 (90 Base) MCG/ACT inhaler, TAKE 2 PUFFS BY MOUTH EVERY 6 HOURS AS NEEDED FOR WHEEZE OR SHORTNESS OF BREATH  Current Outpatient Medications (Analgesics):    ibuprofen (ADVIL) 400 MG tablet, TAKE 1 TABLET BY MOUTH 2 TIMES DAILY AS NEEDED FOR MODERATE PAIN.   Current Outpatient Medications (Other):    DULoxetine (CYMBALTA) 30 MG capsule, Take 2 capsules (60 mg total) by mouth daily.   esomeprazole (NEXIUM) 40 MG capsule, Take 40 mg by mouth daily at 12 noon.   rifaximin (XIFAXAN) 550 MG TABS tablet, Take 1 tablet (550 mg total) by mouth 3 (three) times daily.  Review of Systems:  Review of system negative unless stated in the problem list or HPI.    Physical Exam:  Vitals:   06/13/22 1031  BP: 122/82  Pulse: 81  Temp: 98.4 F (36.9 C)  TempSrc: Oral  SpO2: 99%  Weight: (!) 424 lb 1.6 oz (192.4 kg)  Height: 5\' 8"  (1.727 m)    Physical Exam General: NAD HENT: NCAT Lungs: CTAB, no wheeze, rhonchi or rales.  Cardiovascular: Normal heart sounds, no r/m/g, 2+ pulses in all extremities. No LE edema Abdomen: No TTP, normal bowel sounds MSK: No asymmetry or muscle atrophy.  Skin: no lesions noted on exposed skin Neuro: Alert and oriented x4. CN grossly intact Psych: Normal mood and normal affect   Assessment & Plan:   No problem-specific Assessment & Plan notes found for this encounter.   See Encounters Tab for problem  based charting.  Patient discussed with Dr. , MD Karrie Meres. [facility] Internal Medicine Residency, PGY-2   Had a complaint of SHOB/CP waking up from sleep.  Happens few times unable to specify.  Sleep apnea testing.   Cymbalta Last filled in June  IBS-D Xifaxan 550 mg given by GI.   Obesity Follow up referral to bariatric referral by Dr. July.

## 2022-06-13 NOTE — Patient Instructions (Addendum)
[name], it was a pleasure seeing you today! You endorsed feeling well today. Below are some of the things we talked about this visit. We look forward to seeing you in the follow up appointment!  Today we discussed: We will refill your medications today. You have not been taking your Cymbalta and need refills. Please start with taking one tablet for now. We will see you in one month.  Please see your GI doctor. We will refer you for a sleep study.   I have ordered the following labs today:  Lab Orders  No laboratory test(s) ordered today      Referrals ordered today:   Referral Orders  No referral(s) requested today     I have ordered the following medication/changed the following medications:   Stop the following medications: Medications Discontinued During This Encounter  Medication Reason   ibuprofen (ADVIL) 400 MG tablet Patient has not taken in last 30 days     Start the following medications: No orders of the defined types were placed in this encounter.    Follow-up: 1 month follow up  Please make sure to arrive 15 minutes prior to your next appointment. If you arrive late, you may be asked to reschedule.   We look forward to seeing you next time. Please call our clinic at [phone] if you have any questions or concerns. The best time to call is Monday-Friday from 9am-4pm, but there is someone available 24/7. If after hours or the weekend, call the main hospital number and ask for the Internal Medicine Resident On-Call. If you need medication refills, please notify your pharmacy one week in advance and they will send [place] a request.  Thank you for letting us take part in your care. Wishing you the best!  Thank you, Gwenevere Abbot, MD

## 2022-06-14 ENCOUNTER — Encounter: Payer: Self-pay | Admitting: Internal Medicine

## 2022-06-14 NOTE — Assessment & Plan Note (Addendum)
This is a chronic problem for the patient that is alleviated by conservative therapy but recurs. This is very likely 2/2 to her obesity. Plan is to continue conservative therapy and use exercises done previously by PT and attempt to treat underlying obesity (see obesity tab). No red flag symptoms present. The duloxetine will help with back as well.  -Continue conservative treatment, re-start duloxetine -Treat obesity as outlined separately.

## 2022-06-14 NOTE — Assessment & Plan Note (Signed)
Will address care gaps in one month appointment.

## 2022-06-14 NOTE — Assessment & Plan Note (Signed)
Pt follows with GI. Last follow up was 03/2022 and pt was prescribed 14 day course of Rifaximan and noted improvement. She wanted refill for that medication. Advised GI recommended follow up with them and encouraged her to schedule an appointment with them.  -Continue GI follow up.

## 2022-06-14 NOTE — Assessment & Plan Note (Addendum)
Referral was placed by GI for bariatric surgery given Class III obesity. She missed her appointment for initial evaluation. Will see if our office can follow up to see if she can be re-scheduled based on the prior referral. Will obtain A1c given obesity and if pt diabetic then we can utilize GLP 1 agonist for weight loss. A1c within normal limit. Pt had episodes of waking up gasping for breathing occurring few times over the past year. She is at risk given her obesity. Her Stop-Bang Score is 4. She will need sleep study to before any surgery to rule out OSA.  -Re-schedule bariatric surgery appointment.  -Referral to sleep study placed.

## 2022-06-14 NOTE — Assessment & Plan Note (Signed)
Pt stated this improved with duloxetine but she has not taken this medication for a couple of months. Last refill date is 04/04/22. Will refill this and start at 30 mg and follow up in 1 month to up-titrate to 30 mg BID.  -Restart Duloxetine 30 mg qd.  -CTM

## 2022-06-16 ENCOUNTER — Encounter: Payer: Medicaid Other | Admitting: Internal Medicine

## 2022-06-16 NOTE — Addendum Note (Signed)
Addended by: Gwenevere Abbot on: 06/16/2022 03:09 PM   Modules accepted: Orders

## 2022-06-17 NOTE — Progress Notes (Signed)
Internal Medicine Clinic Attending  Case discussed with the resident at the time of the visit.  We reviewed the resident's history and exam and pertinent patient test results.  I agree with the assessment, diagnosis, and plan of care documented in the resident's note.  

## 2022-06-30 ENCOUNTER — Telehealth: Payer: Self-pay | Admitting: Neurology

## 2022-06-30 ENCOUNTER — Institutional Professional Consult (permissible substitution): Payer: Medicaid Other | Admitting: Neurology

## 2022-06-30 NOTE — Telephone Encounter (Signed)
Pt cancel appt due transportation issues.

## 2022-07-03 ENCOUNTER — Other Ambulatory Visit: Payer: Self-pay | Admitting: Internal Medicine

## 2022-07-03 DIAGNOSIS — F419 Anxiety disorder, unspecified: Secondary | ICD-10-CM

## 2022-07-03 DIAGNOSIS — J309 Allergic rhinitis, unspecified: Secondary | ICD-10-CM

## 2022-07-03 DIAGNOSIS — K580 Irritable bowel syndrome with diarrhea: Secondary | ICD-10-CM

## 2022-07-03 DIAGNOSIS — F339 Major depressive disorder, recurrent, unspecified: Secondary | ICD-10-CM

## 2022-07-14 ENCOUNTER — Encounter: Payer: Medicaid Other | Admitting: Student

## 2022-07-14 ENCOUNTER — Encounter: Payer: Self-pay | Admitting: Internal Medicine

## 2022-07-28 ENCOUNTER — Institutional Professional Consult (permissible substitution): Payer: Medicaid Other | Admitting: Neurology

## 2022-07-28 ENCOUNTER — Encounter: Payer: Self-pay | Admitting: Neurology

## 2022-07-29 ENCOUNTER — Telehealth: Payer: Self-pay | Admitting: Neurology

## 2022-07-29 NOTE — Telephone Encounter (Signed)
Second NS for New appt. Please follow dismissal protocol as per our No Show Policy.

## 2022-07-30 ENCOUNTER — Ambulatory Visit: Payer: Medicaid Other | Admitting: Podiatry

## 2022-07-30 ENCOUNTER — Encounter: Payer: Self-pay | Admitting: Neurology

## 2022-08-25 ENCOUNTER — Other Ambulatory Visit: Payer: Self-pay

## 2022-08-25 DIAGNOSIS — F419 Anxiety disorder, unspecified: Secondary | ICD-10-CM

## 2022-08-25 DIAGNOSIS — K580 Irritable bowel syndrome with diarrhea: Secondary | ICD-10-CM

## 2022-08-25 DIAGNOSIS — F339 Major depressive disorder, recurrent, unspecified: Secondary | ICD-10-CM

## 2022-08-25 NOTE — Telephone Encounter (Signed)
DULoxetine (CYMBALTA) 30 MG capsule, refill request @ [id number] - [place],  - [address].

## 2022-08-27 MED ORDER — DULOXETINE HCL 30 MG PO CPEP: 30.0000 mg | ORAL_CAPSULE | Freq: Every day | ORAL | 0 refills | Status: DC

## 2022-09-03 ENCOUNTER — Encounter: Payer: Medicaid Other | Admitting: Student

## 2022-09-08 ENCOUNTER — Encounter: Payer: Medicaid Other | Admitting: Student

## 2022-09-10 ENCOUNTER — Ambulatory Visit (INDEPENDENT_AMBULATORY_CARE_PROVIDER_SITE_OTHER): Payer: Medicaid Other | Admitting: Podiatry

## 2022-09-10 DIAGNOSIS — R601 Generalized edema: Secondary | ICD-10-CM | POA: Diagnosis not present

## 2022-09-10 DIAGNOSIS — L03031 Cellulitis of right toe: Secondary | ICD-10-CM | POA: Diagnosis not present

## 2022-09-10 NOTE — Progress Notes (Signed)
Subjective:  Patient ID: Kathryn Andrews, female    DOB: March 29, 1996,  MRN: [id number]  Chief Complaint  Patient presents with   Foot Pain    26 y.o. female presents with the above complaint.  Patient presents with right foot generalized swelling without any localized pain.  Patient states the swelling has been present for quite some time when to get it evaluated.  She has been in a dependent position for quite some time.  She does not wear any kind of compression socks or sleep.  She went to get it evaluated she denies any other acute complaints.  Pain scale is 2 out of 10.   Review of Systems: Negative except as noted in the HPI. Denies N/V/F/Ch.  Past Medical History:  Diagnosis Date   Abdominal pain, epigastric 03/08/2021   Anxiety    Asthma    Attention deficit hyperactivity disorder    Bloating 03/08/2021   Chest pain    Chronic constipation    Chronic diarrhea 03/08/2021   Chronic lower back pain 04/02/2020   Class 3 severe obesity without serious comorbidity with body mass index (BMI) of 60.0 to 69.9 in adult [facility]) 12/15/2021   Depression    Functional dyspepsia 02/28/2020   Generalized abdominal pain 12/15/2021   Irritable bowel syndrome with diarrhea 12/15/2021   Morbid obesity ([facility]) 03/02/2020   Urinary frequency 04/02/2020    Current Outpatient Medications:    DULoxetine (CYMBALTA) 30 MG capsule, Take 1 capsule (30 mg total) by mouth daily., Disp: 30 capsule, Rfl: 0   esomeprazole (NEXIUM) 40 MG capsule, Take 1 capsule (40 mg total) by mouth daily at 12 noon., Disp: 30 capsule, Rfl: 2   hydrOXYzine (ATARAX) 10 MG tablet, Take 1 tablet (10 mg total) by mouth 3 (three) times daily as needed., Disp: 30 tablet, Rfl: 3   lidocaine (HM LIDOCAINE PATCH) 4 %, Place 1 patch onto the skin daily., Disp: 30 patch, Rfl: 0   loratadine (CLARITIN) 10 MG tablet, Take 1 tablet (10 mg total) by mouth daily., Disp: 90 tablet, Rfl: 0   rifaximin (XIFAXAN) 550 MG TABS tablet, Take 1 tablet (550 mg  total) by mouth 3 (three) times daily., Disp: 42 tablet, Rfl: 0   VENTOLIN HFA 108 (90 Base) MCG/ACT inhaler, TAKE 2 PUFFS BY MOUTH EVERY 6 HOURS AS NEEDED FOR WHEEZE OR SHORTNESS OF BREATH, Disp: 18 each, Rfl: 2  Social History   Tobacco Use  Smoking Status Every Day   Types: E-cigarettes   Start date: 02/28/2019  Smokeless Tobacco Never  Tobacco Comments   Vapes     No Known Allergies Objective:  There were no vitals filed for this visit. There is no height or weight on file to calculate BMI. Constitutional Well developed. Well nourished.  Vascular Dorsalis pedis pulses palpable bilaterally. Posterior tibial pulses palpable bilaterally. Capillary refill normal to all digits.  No cyanosis or clubbing noted. Pedal hair growth normal.  Neurologic Normal speech. Oriented to person, place, and time. Epicritic sensation to light touch grossly present bilaterally.  Dermatologic Nails well groomed and normal in appearance. No open wounds. No skin lesions.  Orthopedic: Right generalized ankle swelling noted nonpitting edema.  No pain with range of motion of the ankle joint or midfoot joints.  No localized pain noted.  No area of tenderness noted.   Radiographs: None Assessment:   1. Generalized edema    Plan:  Patient was evaluated and treated and all questions answered.  Right foot generalized swelling noted likely  due to dependent position -All questions and concerns were discussed with the patient in extensive detail. -I discussed with her the importance of elevation and compression socks.  Both of these things were discussed in extensive detail she states understanding. -Compression sleeve sleeve was dispensed. -If there is no improvement we can discuss further workup.  She states understanding  No follow-ups on file.

## 2022-09-16 ENCOUNTER — Ambulatory Visit (INDEPENDENT_AMBULATORY_CARE_PROVIDER_SITE_OTHER): Payer: Medicaid Other | Admitting: Student

## 2022-09-16 VITALS — BP 124/78 | HR 55 | Temp 97.9°F | Ht 68.0 in | Wt >= 6400 oz

## 2022-09-16 DIAGNOSIS — K582 Mixed irritable bowel syndrome: Secondary | ICD-10-CM | POA: Diagnosis not present

## 2022-09-16 DIAGNOSIS — F419 Anxiety disorder, unspecified: Secondary | ICD-10-CM | POA: Diagnosis not present

## 2022-09-16 DIAGNOSIS — F32A Depression, unspecified: Secondary | ICD-10-CM | POA: Diagnosis not present

## 2022-09-16 DIAGNOSIS — J309 Allergic rhinitis, unspecified: Secondary | ICD-10-CM

## 2022-09-16 DIAGNOSIS — Z6841 Body Mass Index (BMI) 40.0 and over, adult: Secondary | ICD-10-CM | POA: Diagnosis not present

## 2022-09-16 DIAGNOSIS — S39012A Strain of muscle, fascia and tendon of lower back, initial encounter: Secondary | ICD-10-CM | POA: Diagnosis not present

## 2022-09-16 MED ORDER — LIDOCAINE 4 % EX PTCH: 1.0000 | MEDICATED_PATCH | CUTANEOUS | 0 refills | Status: AC

## 2022-09-16 MED ORDER — HYDROXYZINE HCL 10 MG PO TABS: 10.0000 mg | ORAL_TABLET | Freq: Three times a day (TID) | ORAL | 3 refills | Status: DC | PRN

## 2022-09-16 NOTE — Patient Instructions (Addendum)
Allergies Please try xyxal, cetirizine, or allegra for allergies   Depression/anxiety Please continue duloxetine 30 mg daily Please take with the Nexium to help prevent nausea  You can try atarax 10 mg three times daily as needed to help with stressful situations  Back pain I believe you strained you back from falling a week ago Please continue doing back stretch and staying active I will make a referral to PT Try lidocaine patches to help with you back pain Follow up with bariatric surgery  [facility] Sur[place] Address: [address], [place], [place] [address] Phone: [phone]  IBS please make an appointment to follow up with you stomach doctor Corliss Parish, MD [facility] Gastroenterology Advanced Endoscopy Office # [phone]  Follow up in 1 month

## 2022-09-23 NOTE — Assessment & Plan Note (Signed)
States loratadine helps but feels she need a take 3-4 tablets daily. Recommended she try alternative antihistamine to see if they are more effective. She will fexofenadine, cetrizine, or levocetirizine to see if they are more efficacious.

## 2022-09-23 NOTE — Assessment & Plan Note (Signed)
Fall 1 week ago, tripper over dog. Having right sided back pain. States she fell on her left hip. Denies head trauma or hip pain. Diffuse lumbar pain R>L. Stopped taking duloxetine due to GI upset. States she tolerates with if she takes esomeprazole. Likely has lumbosacral strain from fall exacerbation chronic back pain. No red flag symptoms do not think imaging necessary at this time.   -restart duloxetine 30 mg daily -lidocaine patches -referral to PT - weight is contributing to her chronic back pain, she need to schedule appointment with bariatric surgery. Contact information provided.

## 2022-09-23 NOTE — Assessment & Plan Note (Signed)
She is not sure if she tried the rifaximiin. She has not followed up. Still have frequent stools, but no worse than usual. Encourage her to follow up with GI.

## 2022-09-23 NOTE — Assessment & Plan Note (Signed)
Dismissed from [facility] for no showing appointments. States she forgot about her visit and went on the wong day. Still interested in sleepy study and CPAP if she were to need it. Will place new referral.

## 2022-09-23 NOTE — Assessment & Plan Note (Signed)
PHQ9 17 and GAD7 12 today. Stopped duloxetine due to GI side effects, but states it helps her mood and side effect tolerable with nexium. Notes social anxiety has been worse lately and often avoid situations due to this. Will restart duloxetine 30 mg daily and add on hydroxyzine for as needed for anxiety provoking situations. Offered her counseling but she declined.

## 2022-09-23 NOTE — Progress Notes (Signed)
Established Patient Office Visit  Subjective   Patient ID: Kathryn Andrews, female    DOB: August 07, 1996  Age: 26 y.o. MRN: [id number]  Chief Complaint  Patient presents with   Medication Refill   Follow-up    Kathryn Andrews is a 26 y.o. person living with a history listed below who presents to clinic for low back pain. Please refer to problem based charting for further details and assessment and plan of current problem and chronic medical conditions.     Patient Active Problem List   Diagnosis Date Noted   Irritable bowel syndrome with both constipation and diarrhea 02/01/2022   Low back strain 11/18/2021   Anxiety and depression 04/02/2020   Healthcare maintenance 03/02/2020   Class 3 severe obesity with body mass index (BMI) of 60.0 to 69.9 in adult [facility]) 03/02/2020   Allergic rhinitis 03/02/2020      Review of Systems  Constitutional:  Negative for chills and fever.  Respiratory:  Negative for sputum production and shortness of breath.   Cardiovascular:  Negative for chest pain.  Gastrointestinal:  Positive for diarrhea. Negative for abdominal pain, nausea and vomiting.  Musculoskeletal:  Positive for back pain. Negative for joint pain.  All other systems reviewed and are negative.     Objective:     BP 124/78 (BP Location: Right Arm, Patient Position: Sitting, Cuff Size: Small)   Pulse (!) 55   Temp 97.9 F (36.6 C) (Oral)   Ht 5\' 8"  (1.727 m)   Wt (!) 401 lb 9.6 oz (182.2 kg)   SpO2 98%   BMI 61.06 kg/m  BP Readings from Last 3 Encounters:  09/16/22 124/78  06/13/22 122/82  03/26/22 124/82      Physical Exam Constitutional:      General: She is not in acute distress.    Appearance: She is obese. She is not toxic-appearing.  HENT:     Mouth/Throat:     Mouth: Mucous membranes are moist.     Pharynx: Oropharynx is clear.  Eyes:     Extraocular Movements: Extraocular movements intact.     Conjunctiva/sclera: Conjunctivae normal.     Pupils: Pupils  are equal, round, and reactive to light.  Cardiovascular:     Rate and Rhythm: Normal rate and regular rhythm.     Heart sounds: No murmur heard. Pulmonary:     Effort: Pulmonary effort is normal.     Breath sounds: No rhonchi or rales.  Abdominal:     General: Abdomen is flat. Bowel sounds are normal. There is no distension.     Palpations: Abdomen is soft.     Tenderness: There is no abdominal tenderness.  Musculoskeletal:        General: No deformity. Normal range of motion.     Right lower leg: No edema.     Left lower leg: No edema.     Comments: TTP of the paraspinal muscles R>L  Skin:    General: Skin is warm and dry.     Capillary Refill: Capillary refill takes less than 2 seconds.  Neurological:     General: No focal deficit present.     Mental Status: She is alert and oriented to person, place, and time. Mental status is at baseline.  Psychiatric:     Comments: Flat affect, normal behavior, no SI, HI, or AVD      No results found for any visits on 09/16/22.     The ASCVD Risk score (Arnett DK, et al., 2019)  failed to calculate for the following reasons:   The 2019 ASCVD risk score is only valid for ages 44 to 40    Assessment & Plan:   Problem List Items Addressed This Visit       Respiratory   Allergic rhinitis    States loratadine helps but feels she need a take 3-4 tablets daily. Recommended she try alternative antihistamine to see if they are more effective. She will fexofenadine, cetrizine, or levocetirizine to see if they are more efficacious.         Digestive   Irritable bowel syndrome with both constipation and diarrhea    She is not sure if she tried the rifaximiin. She has not followed up. Still have frequent stools, but no worse than usual. Encourage her to follow up with GI.         Musculoskeletal and Integument   Low back strain - Primary    Fall 1 week ago, tripper over dog. Having right sided back pain. States she fell on her left hip.  Denies head trauma or hip pain. Diffuse lumbar pain R>L. Stopped taking duloxetine due to GI upset. States she tolerates with if she takes esomeprazole. Likely has lumbosacral strain from fall exacerbation chronic back pain. No red flag symptoms do not think imaging necessary at this time.   -restart duloxetine 30 mg daily -lidocaine patches -referral to PT - weight is contributing to her chronic back pain, she need to schedule appointment with bariatric surgery. Contact information provided.      Relevant Orders   Ambulatory referral to Physical Therapy     Other   Class 3 severe obesity with body mass index (BMI) of 60.0 to 69.9 in adult (HCC)    Dismissed from [facility] for no showing appointments. States she forgot about her visit and went on the wong day. Still interested in sleepy study and CPAP if she were to need it. Will place new referral.       Relevant Orders   Nocturnal polysomnography (NPSG)   Anxiety and depression    PHQ9 17 and GAD7 12 today. Stopped duloxetine due to GI side effects, but states it helps her mood and side effect tolerable with nexium. Notes social anxiety has been worse lately and often avoid situations due to this. Will restart duloxetine 30 mg daily and add on hydroxyzine for as needed for anxiety provoking situations. Offered her counseling but she declined.      Relevant Medications   hydrOXYzine (ATARAX) 10 MG tablet    Return in about 4 weeks (around 10/14/2022).    Quincy Simmonds, MD

## 2022-09-25 NOTE — Progress Notes (Signed)
Internal Medicine Clinic Attending  I saw and evaluated the patient.  I personally confirmed the key portions of the history and exam documented by Dr. Liang and I reviewed pertinent patient test results.  The assessment, diagnosis, and plan were formulated together and I agree with the documentation in the resident's note.  

## 2022-10-16 ENCOUNTER — Ambulatory Visit (INDEPENDENT_AMBULATORY_CARE_PROVIDER_SITE_OTHER): Payer: Medicaid Other | Admitting: Internal Medicine

## 2022-10-16 ENCOUNTER — Encounter: Payer: Self-pay | Admitting: Internal Medicine

## 2022-10-16 VITALS — BP 140/71 | HR 63 | Temp 97.8°F | Ht 68.5 in | Wt >= 6400 oz

## 2022-10-16 DIAGNOSIS — S39012D Strain of muscle, fascia and tendon of lower back, subsequent encounter: Secondary | ICD-10-CM

## 2022-10-16 MED ORDER — NAPROXEN 500 MG PO TABS: 500.0000 mg | ORAL_TABLET | Freq: Two times a day (BID) | ORAL | 0 refills | Status: AC

## 2022-10-16 MED ORDER — BACLOFEN 10 MG PO TABS: 10.0000 mg | ORAL_TABLET | Freq: Three times a day (TID) | ORAL | 0 refills | Status: AC

## 2022-10-16 NOTE — Patient Instructions (Addendum)
Kathryn Andrews, it was a pleasure seeing you today! You endorsed feeling well today. Below are some of the things we talked about this visit. We look forward to seeing you in the follow up appointment!  Today we discussed: I will send in muscle relaxer to the pharmacy. I will short course of naproxen to the pharmacy. Do not take additional ibuprofen, or medicines with NSAIDS in it. Take up to 3000 mg of tylenol daily. Use heat therapy and massage for you back.  I will follow up on PT referral placed previously  I have ordered the following labs today:  Lab Orders  No laboratory test(s) ordered today      Referrals ordered today:   Referral Orders  No referral(s) requested today     I have ordered the following medication/changed the following medications:   Stop the following medications: There are no discontinued medications.   Start the following medications: Meds ordered this encounter  Medications   baclofen (LIORESAL) 10 MG tablet    Sig: Take 1 tablet (10 mg total) by mouth 3 (three) times daily for 7 days.    Dispense:  21 each    Refill:  0   naproxen (NAPROSYN) 500 MG tablet    Sig: Take 1 tablet (500 mg total) by mouth 2 (two) times daily with a meal for 7 days.    Dispense:  14 tablet    Refill:  0     Follow-up: 3 month follow up or earlier if need for back pain  Please make sure to arrive 15 minutes prior to your next appointment. If you arrive late, you may be asked to reschedule.   We look forward to seeing you next time. Please call our clinic at [phone] if you have any questions or concerns. The best time to call is Monday-Friday from 9am-4pm, but there is someone available 24/7. If after hours or the weekend, call the main hospital number and ask for the Internal Medicine Resident On-Call. If you need medication refills, please notify your pharmacy one week in advance and they will send [place] a request.  Thank you for letting us take part in your  care. Wishing you the best!  Thank you, Idamae Schuller, MD

## 2022-10-16 NOTE — Progress Notes (Signed)
CC: Needs hep A testing  HPI:  [name] is a 27 y.o. with medical history of GAD, IBS-diarrhea, chronic back pain presenting to [facility] for need for   Please see problem-based list for further details, assessments, and plans.  Past Medical History:  Diagnosis Date   Abdominal pain, epigastric 03/08/2021   Anxiety    Asthma    Attention deficit hyperactivity disorder    Bloating 03/08/2021   Chest pain    Chronic constipation    Chronic diarrhea 03/08/2021   Chronic lower back pain 04/02/2020   Class 3 severe obesity without serious comorbidity with body mass index (BMI) of 60.0 to 69.9 in adult [facility]) 12/15/2021   Depression    Functional dyspepsia 02/28/2020   Generalized abdominal pain 12/15/2021   Irritable bowel syndrome with diarrhea 12/15/2021   Morbid obesity ([place]) 03/02/2020   Urinary frequency 04/02/2020      Current Outpatient Medications (Respiratory):    loratadine (CLARITIN) 10 MG tablet, Take 1 tablet (10 mg total) by mouth daily.   VENTOLIN HFA 108 (90 Base) MCG/ACT inhaler, TAKE 2 PUFFS BY MOUTH EVERY 6 HOURS AS NEEDED FOR WHEEZE OR SHORTNESS OF BREATH  Current Outpatient Medications (Analgesics):    naproxen (NAPROSYN) 500 MG tablet, Take 1 tablet (500 mg total) by mouth 2 (two) times daily with a meal for 7 days.   Current Outpatient Medications (Other):    baclofen (LIORESAL) 10 MG tablet, Take 1 tablet (10 mg total) by mouth 3 (three) times daily for 7 days.   DULoxetine (CYMBALTA) 30 MG capsule, Take 1 capsule (30 mg total) by mouth daily.   esomeprazole (NEXIUM) 40 MG capsule, Take 1 capsule (40 mg total) by mouth daily at 12 noon.   hydrOXYzine (ATARAX) 10 MG tablet, Take 1 tablet (10 mg total) by mouth 3 (three) times daily as needed.   rifaximin (XIFAXAN) 550 MG TABS tablet, Take 1 tablet (550 mg total) by mouth 3 (three) times daily.  Review of Systems:  Review of system negative unless stated in the problem list or HPI.    Physical Exam:  Vitals:    10/16/22 1447  BP: (!) 140/71  Pulse: 63  Temp: 97.8 F (36.6 C)  TempSrc: Oral  SpO2: 97%  Weight: (!) 422 lb (191.4 kg)  Height: 5' 8.5" (1.74 m)    Physical Exam General: NAD HENT: NCAT Lungs: CTAB, no wheeze, rhonchi or rales.  Cardiovascular: Normal heart sounds, no r/m/g, 2+ pulses in all extremities. No LE edema Abdomen: No TTP, normal bowel sounds MSK: No asymmetry or muscle atrophy.  Skin: no lesions noted on exposed skin Neuro: Alert and oriented x4. CN grossly intact Psych: Normal mood and normal affect   Assessment & Plan:   Low back strain Pt presenting for a follow up on her back pain. She states it has not improved. Pt's description of pain being in the paraspinal area points to lumbar muscle strain 2/2 to fall. Exam reassuring without any alarm symptoms. Pt has good strength and sensation in all extremities. Will give short course of naproxen given normal renal function and course of baclofen to help with her back pain. Encouraged her to make an appointment with bariatric surgery as weight loss will reduce her risk of lumbar strain 2/2 to posture while sleeping or during activity. Sent 7 days of baclofen and 7 days of naproxen BID for the patient.    See Encounters Tab for problem based charting.  Patient discussed with Dr. Philipp Ovens  Idamae Schuller, MD Tillie Rung. [facility] Internal Medicine Residency, PGY-2

## 2022-10-18 NOTE — Assessment & Plan Note (Signed)
Pt presenting for a follow up on her back pain. She states it has not improved. Pt's description of pain being in the paraspinal area points to lumbar muscle strain 2/2 to fall. Exam reassuring without any alarm symptoms. Pt has good strength and sensation in all extremities. Will give short course of naproxen given normal renal function and course of baclofen to help with her back pain. Encouraged her to make an appointment with bariatric surgery as weight loss will reduce her risk of lumbar strain 2/2 to posture while sleeping or during activity. Sent 7 days of baclofen and 7 days of naproxen BID for the patient.

## 2022-10-21 NOTE — Addendum Note (Signed)
Addended by: Jodean Lima on: 10/21/2022 09:45 AM   Modules accepted: Level of Service

## 2022-10-21 NOTE — Progress Notes (Signed)
Internal Medicine Clinic Attending  Case discussed with Dr. Khan  At the time of the visit.  We reviewed the resident's history and exam and pertinent patient test results.  I agree with the assessment, diagnosis, and plan of care documented in the resident's note.  

## 2022-11-12 ENCOUNTER — Ambulatory Visit: Payer: Medicaid Other

## 2022-11-24 ENCOUNTER — Ambulatory Visit: Payer: Medicaid Other

## 2022-12-04 ENCOUNTER — Other Ambulatory Visit: Payer: Self-pay

## 2022-12-04 ENCOUNTER — Ambulatory Visit: Payer: Medicaid Other | Attending: Internal Medicine

## 2022-12-04 DIAGNOSIS — G8929 Other chronic pain: Secondary | ICD-10-CM | POA: Diagnosis present

## 2022-12-04 DIAGNOSIS — M545 Low back pain, unspecified: Secondary | ICD-10-CM | POA: Insufficient documentation

## 2022-12-04 DIAGNOSIS — R2689 Other abnormalities of gait and mobility: Secondary | ICD-10-CM | POA: Insufficient documentation

## 2022-12-04 DIAGNOSIS — S39012A Strain of muscle, fascia and tendon of lower back, initial encounter: Secondary | ICD-10-CM | POA: Diagnosis not present

## 2022-12-04 DIAGNOSIS — M6281 Muscle weakness (generalized): Secondary | ICD-10-CM | POA: Diagnosis present

## 2022-12-04 NOTE — Therapy (Signed)
OUTPATIENT PHYSICAL THERAPY THORACOLUMBAR EVALUATION   Patient Name: Kathryn Andrews MRN: [id number] DOB:1996/04/12, 27 y.o., female Today's Date: 12/04/2022  END OF SESSION:  PT End of Session - 12/04/22 1408     Visit Number 1    Number of Visits 9    Date for PT Re-Evaluation 01/29/23    Authorization Type MCD    Activity Tolerance Patient tolerated treatment well    Behavior During Therapy [facility] for tasks assessed/performed             Past Medical History:  Diagnosis Date   Abdominal pain, epigastric 03/08/2021   Anxiety    Asthma    Attention deficit hyperactivity disorder    Bloating 03/08/2021   Chest pain    Chronic constipation    Chronic diarrhea 03/08/2021   Chronic lower back pain 04/02/2020   Class 3 severe obesity without serious comorbidity with body mass index (BMI) of 60.0 to 69.9 in adult [facility]) 12/15/2021   Depression    Functional dyspepsia 02/28/2020   Generalized abdominal pain 12/15/2021   Irritable bowel syndrome with diarrhea 12/15/2021   Morbid obesity ([place]) 03/02/2020   Urinary frequency 04/02/2020   Past Surgical History:  Procedure Laterality Date   adenoid removal     BIOPSY  08/12/2021   Procedure: BIOPSY;  Surgeon: Irving Copas., MD;  Location: [facility];  Service: Gastroenterology;;  EGD and COLON   COLONOSCOPY WITH PROPOFOL N/A 08/12/2021   Procedure: COLONOSCOPY WITH PROPOFOL;  Surgeon: Irving Copas., MD;  Location: [facility];  Service: Gastroenterology;  Laterality: N/A;   ESOPHAGOGASTRODUODENOSCOPY (EGD) WITH PROPOFOL N/A 08/12/2021   Procedure: ESOPHAGOGASTRODUODENOSCOPY (EGD) WITH PROPOFOL;  Surgeon: Rush Landmark Telford Nab., MD;  Location: [facility];  Service: Gastroenterology;  Laterality: N/A;   EXTERNAL EAR SURGERY     POLYPECTOMY  08/12/2021   Procedure: POLYPECTOMY;  Surgeon: Mansouraty, Telford Nab., MD;  Location: [facility];  Service: Gastroenterology;;   [place] EXTRACTION     Patient Active Problem  List   Diagnosis Date Noted   Irritable bowel syndrome with both constipation and diarrhea 02/01/2022   Low back strain 11/18/2021   Anxiety and depression 04/02/2020   Healthcare maintenance 03/02/2020   Class 3 severe obesity with body mass index (BMI) of 60.0 to 69.9 in adult ([place]) 03/02/2020   Allergic rhinitis 03/02/2020    PCP: Idamae Schuller, MD   REFERRING PROVIDER: Aldine Contes, MD  REFERRING DIAG: S39.012A (ICD-10-CM) - Strain of lumbar region, initial encounter  Rationale for Evaluation and Treatment: Rehabilitation  THERAPY DIAG:  Low back pain  ONSET DATE: 09/2022  SUBJECTIVE:  SUBJECTIVE STATEMENT: Pt presenting for a follow up on her back pain. She states it has not improved. Pt's description of pain being in the paraspinal area points to lumbar muscle strain 2/2 to fall. Exam reassuring without any alarm symptoms. Pt has good strength and sensation in all extremities. Will give short course of naproxen given normal renal function and course of baclofen to help with her back pain. Encouraged her to make an appointment with bariatric surgery as weight loss will reduce her risk of lumbar strain 2/2 to posture while sleeping or during activity. Sent 7 days of baclofen and 7 days of naproxen BID for the patient.    PERTINENT HISTORY:  Low back strain - Primary     Fall 1 week ago, tripper over dog. Having right sided back pain. States she fell on her left hip. Denies head trauma or hip pain. Diffuse lumbar pain R>L. Stopped taking duloxetine due to GI upset. States she tolerates with if she takes esomeprazole. Likely has lumbosacral strain from fall exacerbation chronic back pain. No red flag symptoms do not think imaging necessary at this time.     PAIN:  Are you having pain? Yes: NPRS  scale: 9/10 Pain location: low and mid back  Pain description: ache, cramp Aggravating factors: prolonged standing, bending, lifting Relieving factors: lying down   PRECAUTIONS: None  WEIGHT BEARING RESTRICTIONS: No  FALLS:  Has patient fallen in last 6 months? Yes. Number of falls 1  LIVING ENVIRONMENT: Lives with: lives with their family Lives in: House/apartment Stairs:  yes   OCCUPATION: not working  PLOF: Independent  PATIENT GOALS: To manage my low back pain and lose weight  NEXT MD VISIT: PRN  OBJECTIVE:   DIAGNOSTIC FINDINGS:  none  PATIENT SURVEYS:  Modified Oswestry 18/50 36% perceived disability   SCREENING FOR RED FLAGS: negative  MUSCLE LENGTH: Hamstrings: Right 90 deg; Left 90 deg  POSTURE: rounded shoulders, forward head, decreased lumbar lordosis, and increased thoracic kyphosis  PALPATION: Deferred due to body habitus  LUMBAR ROM:   AROM eval  Flexion 90%  Extension 90%  Right lateral flexion 90%P!  Left lateral flexion 90%  Right rotation 90%  Left rotation 90%P!   (Blank rows = not tested)  LOWER EXTREMITY ROM:   WFL limited by body habitus  Active  Right eval Left eval  Hip flexion    Hip extension    Hip abduction    Hip adduction    Hip internal rotation    Hip external rotation    Knee flexion    Knee extension    Ankle dorsiflexion    Ankle plantarflexion    Ankle inversion    Ankle eversion     (Blank rows = not tested)  LOWER EXTREMITY MMT:    MMT Right eval Left eval  Hip flexion 4 4  Hip extension 4 4  Hip abduction 4 4  Hip adduction    Hip internal rotation    Hip external rotation    Knee flexion    Knee extension 4 4  Ankle dorsiflexion    Ankle plantarflexion 4 4  Ankle inversion    Ankle eversion     (Blank rows = not tested)  LUMBAR SPECIAL TESTS:  Straight leg raise test: Negative, Slump test: Negative, and FABER test: deferred due to body habitus  FUNCTIONAL TESTS:  5 times sit to  stand: 17s w/o UE Support  GAIT: Distance walked: 66f x2 Assistive device utilized: None Level of  assistance: Complete Independence  TODAY'S TREATMENT:                                                                                                                              DATE: 12/04/22 Eval    PATIENT EDUCATION:  Education details: Discussed eval findings, rehab rationale and POC and patient is in agreement  Person educated: Patient Education method: Explanation Education comprehension: verbalized understanding and needs further education  HOME EXERCISE PROGRAM: Access Code: [email or web address] URL: [email or web address] Date: 12/04/2022 Prepared by: Sharlynn Oliphant  Exercises - Supine Bridge  - 2 x daily - 5 x weekly - 2 sets - 10 reps - Supine 90/90 Abdominal Bracing  - 2 x daily - 5 x weekly - 2 sets - 2 reps - 30s hold  ASSESSMENT:  CLINICAL IMPRESSION: Patient is a 27 y.o. female who was seen today for physical therapy evaluation and treatment for chronic low back pain.  Test positions limited by body habitus.  Patient presents with weakness in core, trunk and LE's.  Overall trunk and LE ROM is good with pain with RSB and L rotation.  No radicular symptoms reported, no neuro tension signs elicited.  Perceived disability at 36% per ODI and 5x STS 17s indicating decreased functional strength.   OBJECTIVE IMPAIRMENTS: decreased activity tolerance, decreased knowledge of condition, decreased mobility, decreased strength, impaired perceived functional ability, obesity, and pain.   ACTIVITY LIMITATIONS: carrying, lifting, bending, and standing  PERSONAL FACTORS: Age, Past/current experiences, Time since onset of injury/illness/exacerbation, and previous PT for same condition  are also affecting patient's functional outcome.   REHAB POTENTIAL: Fair based on chronicity, body habitus and prior OPPT  CLINICAL DECISION MAKING: Stable/uncomplicated  EVALUATION COMPLEXITY:  Low   GOALS: Goals reviewed with patient? No  SHORT TERM GOALS: Target date: 12/25/2022  Patient to demonstrate independence in HEP  Baseline: [id number] Goal status: INITIAL  2.  Patient to hold 90/90 position for 30s to demo increased core strength  Baseline: 12s Goal status: INITIAL    LONG TERM GOALS: Target date: 01/15/2023  Decrease ODI score to 14/50 to improve disability from moderate to minimal Baseline: 18/50 Goal status: INITIAL  2.  Increase LE strength to 4+/5 throughout Baseline:  MMT Right eval Left eval  Hip flexion 4 4  Hip extension 4 4  Hip abduction 4 4  Hip adduction    Hip internal rotation    Hip external rotation    Knee flexion    Knee extension 4 4  Ankle dorsiflexion    Ankle plantarflexion 4 4  Ankle inversion    Ankle eversion     Goal status: INITIAL  3.  Decrease 5x STS score to 15s arms crossed Baseline: 17s arms crossed Goal status: INITIAL  4.  Decrease worst pain to 6/10 Baseline: 9/10 Goal status: INITIAL    PLAN:  PT FREQUENCY: 2x/week  PT DURATION: 6 weeks  PLANNED INTERVENTIONS: Therapeutic exercises, Therapeutic activity, Neuromuscular re-education,  Balance training, Gait training, Patient/Family education, Self Care, Joint mobilization, Dry Needling, Manual therapy, and Re-evaluation.  PLAN FOR NEXT SESSION: HEP review and update, flexibility and ROM, core strengthening, postural training, aerobic work   Lanice Shirts, PT 12/04/2022, 3:08 PM  Check all possible CPT codes: [phone] - PT Re-evaluation, 97110- Therapeutic Exercise, [phone]- Neuro Re-education, [phone] - Gait Training, [phone] - Manual Therapy, and [phone] - Therapeutic Activities    Check all conditions that are expected to impact treatment: Morbid obesity, Respiratory disorders, and Social determinants of health   If treatment provided at initial evaluation, no treatment charged due to lack of authorization.

## 2022-12-10 ENCOUNTER — Ambulatory Visit: Payer: Medicaid Other | Admitting: Podiatry

## 2022-12-17 ENCOUNTER — Ambulatory Visit: Payer: Medicaid Other

## 2022-12-22 ENCOUNTER — Ambulatory Visit: Payer: Medicaid Other | Attending: Internal Medicine

## 2022-12-22 DIAGNOSIS — R2689 Other abnormalities of gait and mobility: Secondary | ICD-10-CM | POA: Insufficient documentation

## 2022-12-22 DIAGNOSIS — G8929 Other chronic pain: Secondary | ICD-10-CM | POA: Insufficient documentation

## 2022-12-22 DIAGNOSIS — M6281 Muscle weakness (generalized): Secondary | ICD-10-CM | POA: Insufficient documentation

## 2022-12-22 DIAGNOSIS — M545 Low back pain, unspecified: Secondary | ICD-10-CM | POA: Diagnosis present

## 2022-12-22 NOTE — Therapy (Signed)
OUTPATIENT PHYSICAL THERAPY TREATMENT NOTE   Patient Name: Kathryn Andrews MRN: [id number] DOB:1996-03-03, 27 y.o., female Today's Date: 12/22/2022  PCP: Idamae Schuller, MD  REFERRING PROVIDER: Aldine Contes, MD   END OF SESSION:   PT End of Session - 12/22/22 1512     Visit Number 2    Number of Visits 9    Date for PT Re-Evaluation 01/29/23    Authorization Type MCD    PT Start Time 1505   arrived late   PT Stop Time 1530    PT Time Calculation (min) 25 min    Activity Tolerance Patient tolerated treatment well    Behavior During Therapy [facility] for tasks assessed/performed             Past Medical History:  Diagnosis Date   Abdominal pain, epigastric 03/08/2021   Anxiety    Asthma    Attention deficit hyperactivity disorder    Bloating 03/08/2021   Chest pain    Chronic constipation    Chronic diarrhea 03/08/2021   Chronic lower back pain 04/02/2020   Class 3 severe obesity without serious comorbidity with body mass index (BMI) of 60.0 to 69.9 in adult [facility]) 12/15/2021   Depression    Functional dyspepsia 02/28/2020   Generalized abdominal pain 12/15/2021   Irritable bowel syndrome with diarrhea 12/15/2021   Morbid obesity ([place]) 03/02/2020   Urinary frequency 04/02/2020   Past Surgical History:  Procedure Laterality Date   adenoid removal     BIOPSY  08/12/2021   Procedure: BIOPSY;  Surgeon: Irving Copas., MD;  Location: [facility];  Service: Gastroenterology;;  EGD and COLON   COLONOSCOPY WITH PROPOFOL N/A 08/12/2021   Procedure: COLONOSCOPY WITH PROPOFOL;  Surgeon: Irving Copas., MD;  Location: [facility];  Service: Gastroenterology;  Laterality: N/A;   ESOPHAGOGASTRODUODENOSCOPY (EGD) WITH PROPOFOL N/A 08/12/2021   Procedure: ESOPHAGOGASTRODUODENOSCOPY (EGD) WITH PROPOFOL;  Surgeon: Rush Landmark Telford Nab., MD;  Location: [facility];  Service: Gastroenterology;  Laterality: N/A;   EXTERNAL EAR SURGERY     POLYPECTOMY  08/12/2021   Procedure:  POLYPECTOMY;  Surgeon: Mansouraty, Telford Nab., MD;  Location: [facility];  Service: Gastroenterology;;   [place] EXTRACTION     Patient Active Problem List   Diagnosis Date Noted   Irritable bowel syndrome with both constipation and diarrhea 02/01/2022   Low back strain 11/18/2021   Anxiety and depression 04/02/2020   Healthcare maintenance 03/02/2020   Class 3 severe obesity with body mass index (BMI) of 60.0 to 69.9 in adult ([place]) 03/02/2020   Allergic rhinitis 03/02/2020    REFERRING DIAG: [id number] (ICD-10-CM) - Strain of lumbar region, initial encounter   THERAPY DIAG:  Chronic right-sided low back pain, unspecified whether sciatica present  Muscle weakness (generalized)  Other abnormalities of gait and mobility  Rationale for Evaluation and Treatment Rehabilitation  PERTINENT HISTORY: None  PRECAUTIONS: None  SUBJECTIVE:  SUBJECTIVE STATEMENT:  Pt presents to PT with reports of decreased pain. Has been compliant with HEP.    PAIN:  Are you having pain?  Yes: NPRS scale: 9/10 Pain location: low and mid back  Pain description: ache, cramp Aggravating factors: prolonged standing, bending, lifting Relieving factors: lying down    OBJECTIVE: (objective measures completed at initial evaluation unless otherwise dated)  DIAGNOSTIC FINDINGS:  none   PATIENT SURVEYS:  Modified Oswestry 18/50 36% perceived disability    SCREENING FOR RED FLAGS: negative   MUSCLE LENGTH: Hamstrings: Right 90 deg; Left 90 deg   POSTURE: rounded shoulders, forward head, decreased lumbar lordosis, and increased thoracic kyphosis   PALPATION: Deferred due to body habitus   LUMBAR ROM:    AROM eval  Flexion 90%  Extension 90%  Right lateral flexion 90%P!  Left lateral flexion 90%  Right  rotation 90%  Left rotation 90%P!   (Blank rows = not tested)   LOWER EXTREMITY ROM:   WFL limited by body habitus   Active  Right eval Left eval  Hip flexion      Hip extension      Hip abduction      Hip adduction      Hip internal rotation      Hip external rotation      Knee flexion      Knee extension      Ankle dorsiflexion      Ankle plantarflexion      Ankle inversion      Ankle eversion       (Blank rows = not tested)   LOWER EXTREMITY MMT:     MMT Right eval Left eval  Hip flexion 4 4  Hip extension 4 4  Hip abduction 4 4  Hip adduction      Hip internal rotation      Hip external rotation      Knee flexion      Knee extension 4 4  Ankle dorsiflexion      Ankle plantarflexion 4 4  Ankle inversion      Ankle eversion       (Blank rows = not tested)   LUMBAR SPECIAL TESTS:  Straight leg raise test: Negative, Slump test: Negative, and FABER test: deferred due to body habitus   FUNCTIONAL TESTS:  5 times sit to stand: 17s w/o UE Support   GAIT: Distance walked: 74ft x2 Assistive device utilized: None Level of assistance: Complete Independence   TODAY'S TREATMENT: [facility] Adult PT Treatment:                                                DATE: 12/22/2022 Therapeutic Exercise: SLR x 10 each Bridge 2x10 STS 2x10 Pallof press 2x10 10# Standing hip abd/ext 2x10 30# KB deadlift to platform x 10 - 10#   PATIENT EDUCATION:  Education details: Discussed eval findings, rehab rationale and POC and patient is in agreement  Person educated: Patient Education method: Explanation Education comprehension: verbalized understanding and needs further education   HOME EXERCISE PROGRAM: Access Code: [email or web address] URL: [email or web address] Date: 12/04/2022 Prepared by: Sharlynn Oliphant   Exercises - Supine Bridge  - 2 x daily - 5 x weekly - 2 sets - 10 reps - Supine 90/90 Abdominal Bracing  - 2 x daily - 5 x weekly - 2 sets - 2 reps -  30s hold    ASSESSMENT:   CLINICAL IMPRESSION: Pt was able to complete all prescribed exercises with no adverse effect or increase in pain. Therapy focused on improving core and proximal hip strength in order to decrease pain and improve comfort. Pt continues to benefit from skilled PT, will continue per POC as prescribed and progress as able.    OBJECTIVE IMPAIRMENTS: decreased activity tolerance, decreased knowledge of condition, decreased mobility, decreased strength, impaired perceived functional ability, obesity, and pain.    ACTIVITY LIMITATIONS: carrying, lifting, bending, and standing   PERSONAL FACTORS: Age, Past/current experiences, Time since onset of injury/illness/exacerbation, and previous PT for same condition  are also affecting patient's functional outcome.    GOALS: Goals reviewed with patient? No   SHORT TERM GOALS: Target date: 12/25/2022   Patient to demonstrate independence in HEP  Baseline: [id number] Goal status: INITIAL   2.  Patient to hold 90/90 position for 30s to demo increased core strength  Baseline: 12s Goal status: INITIAL       LONG TERM GOALS: Target date: 01/15/2023   Decrease ODI score to 14/50 to improve disability from moderate to minimal Baseline: 18/50 Goal status: INITIAL   2.  Increase LE strength to 4+/5 throughout Baseline:  MMT Right eval Left eval  Hip flexion 4 4  Hip extension 4 4  Hip abduction 4 4  Hip adduction      Hip internal rotation      Hip external rotation      Knee flexion      Knee extension 4 4  Ankle dorsiflexion      Ankle plantarflexion 4 4  Ankle inversion      Ankle eversion        Goal status: INITIAL   3.  Decrease 5x STS score to 15s arms crossed Baseline: 17s arms crossed Goal status: INITIAL   4.  Decrease worst pain to 6/10 Baseline: 9/10 Goal status: INITIAL       PLAN:   PT FREQUENCY: 2x/week   PT DURATION: 6 weeks   PLANNED INTERVENTIONS: Therapeutic exercises, Therapeutic activity,  Neuromuscular re-education, Balance training, Gait training, Patient/Family education, Self Care, Joint mobilization, Dry Needling, Manual therapy, and Re-evaluation.   PLAN FOR NEXT SESSION: HEP review and update, flexibility and ROM, core strengthening, postural training, aerobic work   Ward Chatters, PT 12/22/2022, 3:33 PM

## 2022-12-23 NOTE — Therapy (Unsigned)
OUTPATIENT PHYSICAL THERAPY TREATMENT NOTE   Patient Name: Kathryn Andrews MRN: [id number] DOB:25-Sep-1996, 27 y.o., female Today's Date: 12/24/2022  PCP: Idamae Schuller, MD  REFERRING PROVIDER: Aldine Contes, MD   END OF SESSION:   PT End of Session - 12/24/22 1406     Visit Number 3    Number of Visits 9    Date for PT Re-Evaluation 01/29/23    Authorization Type MCD    PT Start Time 1400    PT Stop Time 1440    PT Time Calculation (min) 40 min    Activity Tolerance Patient tolerated treatment well    Behavior During Therapy [facility] for tasks assessed/performed              Past Medical History:  Diagnosis Date   Abdominal pain, epigastric 03/08/2021   Anxiety    Asthma    Attention deficit hyperactivity disorder    Bloating 03/08/2021   Chest pain    Chronic constipation    Chronic diarrhea 03/08/2021   Chronic lower back pain 04/02/2020   Class 3 severe obesity without serious comorbidity with body mass index (BMI) of 60.0 to 69.9 in adult [facility]) 12/15/2021   Depression    Functional dyspepsia 02/28/2020   Generalized abdominal pain 12/15/2021   Irritable bowel syndrome with diarrhea 12/15/2021   Morbid obesity ([place]) 03/02/2020   Urinary frequency 04/02/2020   Past Surgical History:  Procedure Laterality Date   adenoid removal     BIOPSY  08/12/2021   Procedure: BIOPSY;  Surgeon: Irving Copas., MD;  Location: [facility];  Service: Gastroenterology;;  EGD and COLON   COLONOSCOPY WITH PROPOFOL N/A 08/12/2021   Procedure: COLONOSCOPY WITH PROPOFOL;  Surgeon: Irving Copas., MD;  Location: [facility];  Service: Gastroenterology;  Laterality: N/A;   ESOPHAGOGASTRODUODENOSCOPY (EGD) WITH PROPOFOL N/A 08/12/2021   Procedure: ESOPHAGOGASTRODUODENOSCOPY (EGD) WITH PROPOFOL;  Surgeon: Rush Landmark Telford Nab., MD;  Location: [facility];  Service: Gastroenterology;  Laterality: N/A;   EXTERNAL EAR SURGERY     POLYPECTOMY  08/12/2021   Procedure: POLYPECTOMY;   Surgeon: Mansouraty, Telford Nab., MD;  Location: [facility];  Service: Gastroenterology;;   [place] EXTRACTION     Patient Active Problem List   Diagnosis Date Noted   Irritable bowel syndrome with both constipation and diarrhea 02/01/2022   Low back strain 11/18/2021   Anxiety and depression 04/02/2020   Healthcare maintenance 03/02/2020   Class 3 severe obesity with body mass index (BMI) of 60.0 to 69.9 in adult ([place]) 03/02/2020   Allergic rhinitis 03/02/2020    REFERRING DIAG: [id number] (ICD-10-CM) - Strain of lumbar region, initial encounter   THERAPY DIAG: Lumbar strain Low back pain chronic  Rationale for Evaluation and Treatment Rehabilitation  PERTINENT HISTORY: None  PRECAUTIONS: None  SUBJECTIVE:  SUBJECTIVE STATEMENT:  Reports no changes in lumbar symptoms.  Cited laying with pillows in the wrong position aggravates her back.   PAIN:  Are you having pain?  Yes: NPRS scale: 9/10 Pain location: low and mid back  Pain description: ache, cramp Aggravating factors: prolonged standing, bending, lifting Relieving factors: lying down    OBJECTIVE: (objective measures completed at initial evaluation unless otherwise dated)  DIAGNOSTIC FINDINGS:  none   PATIENT SURVEYS:  Modified Oswestry 18/50 36% perceived disability    SCREENING FOR RED FLAGS: negative   MUSCLE LENGTH: Hamstrings: Right 90 deg; Left 90 deg   POSTURE: rounded shoulders, forward head, decreased lumbar lordosis, and increased thoracic kyphosis   PALPATION: Deferred due to body habitus   LUMBAR ROM:    AROM eval  Flexion 90%  Extension 90%  Right lateral flexion 90%P!  Left lateral flexion 90%  Right rotation 90%  Left rotation 90%P!   (Blank rows = not tested)   LOWER EXTREMITY ROM:   WFL limited by  body habitus   Active  Right eval Left eval  Hip flexion      Hip extension      Hip abduction      Hip adduction      Hip internal rotation      Hip external rotation      Knee flexion      Knee extension      Ankle dorsiflexion      Ankle plantarflexion      Ankle inversion      Ankle eversion       (Blank rows = not tested)   LOWER EXTREMITY MMT:     MMT Right eval Left eval  Hip flexion 4 4  Hip extension 4 4  Hip abduction 4 4  Hip adduction      Hip internal rotation      Hip external rotation      Knee flexion      Knee extension 4 4  Ankle dorsiflexion      Ankle plantarflexion 4 4  Ankle inversion      Ankle eversion       (Blank rows = not tested)   LUMBAR SPECIAL TESTS:  Straight leg raise test: Negative, Slump test: Negative, and FABER test: deferred due to body habitus   FUNCTIONAL TESTS:  5 times sit to stand: 17s w/o UE Support   GAIT: Distance walked: 33ft x2 Assistive device utilized: None Level of assistance: Complete Independence   TODAY'S TREATMENT: [facility] Adult PT Treatment:                                                DATE: 12/24/22 Therapeutic Exercise: Nustep L2 6 min Seated sciatic stretch 10x B LTR(unable to hold QL stretch) 30s x2 B Supine march 15/15 90/90 30s hold only completing 1/3 trials holding 30s Seated hamstring stretch 30s x2 Bil Open book 10/10 Supine SLR 15/15  [facility] Adult PT Treatment:                                                DATE: 12/22/2022 Therapeutic Exercise: SLR x 10 each Bridge 2x10 STS 2x10 Pallof press 2x10 10# Standing hip abd/ext 2x10  30# KB deadlift to platform x 10 - 10#   PATIENT EDUCATION:  Education details: Discussed eval findings, rehab rationale and POC and patient is in agreement  Person educated: Patient Education method: Explanation Education comprehension: verbalized understanding and needs further education   HOME EXERCISE PROGRAM: Access Code: [email or web address] URL:  [email or web address] Date: 12/04/2022 Prepared by: Sharlynn Oliphant   Exercises - Supine Bridge  - 2 x daily - 5 x weekly - 2 sets - 10 reps - Supine 90/90 Abdominal Bracing  - 2 x daily - 5 x weekly - 2 sets - 2 reps - 30s hold   ASSESSMENT:   CLINICAL IMPRESSION: Began stretch and strength of trunk and Les adding core tasks, LE and hop strengthening, aerobic work.  Unable to maintain 90/90 position more than one attempt indicating core weakness.   OBJECTIVE IMPAIRMENTS: decreased activity tolerance, decreased knowledge of condition, decreased mobility, decreased strength, impaired perceived functional ability, obesity, and pain.    ACTIVITY LIMITATIONS: carrying, lifting, bending, and standing   PERSONAL FACTORS: Age, Past/current experiences, Time since onset of injury/illness/exacerbation, and previous PT for same condition  are also affecting patient's functional outcome.    GOALS: Goals reviewed with patient? No   SHORT TERM GOALS: Target date: 12/25/2022   Patient to demonstrate independence in HEP  Baseline: [id number] Goal status: INITIAL   2.  Patient to hold 90/90 position for 30s to demo increased core strength  Baseline: 12s Goal status: INITIAL       LONG TERM GOALS: Target date: 01/15/2023   Decrease ODI score to 14/50 to improve disability from moderate to minimal Baseline: 18/50 Goal status: INITIAL   2.  Increase LE strength to 4+/5 throughout Baseline:  MMT Right eval Left eval  Hip flexion 4 4  Hip extension 4 4  Hip abduction 4 4  Hip adduction      Hip internal rotation      Hip external rotation      Knee flexion      Knee extension 4 4  Ankle dorsiflexion      Ankle plantarflexion 4 4  Ankle inversion      Ankle eversion        Goal status: INITIAL   3.  Decrease 5x STS score to 15s arms crossed Baseline: 17s arms crossed Goal status: INITIAL   4.  Decrease worst pain to 6/10 Baseline: 9/10 Goal status: INITIAL        PLAN:   PT FREQUENCY: 2x/week   PT DURATION: 6 weeks   PLANNED INTERVENTIONS: Therapeutic exercises, Therapeutic activity, Neuromuscular re-education, Balance training, Gait training, Patient/Family education, Self Care, Joint mobilization, Dry Needling, Manual therapy, and Re-evaluation.   PLAN FOR NEXT SESSION: HEP review and update, flexibility and ROM, core strengthening, postural training, aerobic work   Lanice Shirts, PT 12/24/2022, 2:44 PM

## 2022-12-24 ENCOUNTER — Ambulatory Visit: Payer: Medicaid Other

## 2022-12-24 DIAGNOSIS — R2689 Other abnormalities of gait and mobility: Secondary | ICD-10-CM

## 2022-12-24 DIAGNOSIS — M6281 Muscle weakness (generalized): Secondary | ICD-10-CM

## 2022-12-24 DIAGNOSIS — M545 Low back pain, unspecified: Secondary | ICD-10-CM | POA: Diagnosis not present

## 2022-12-24 DIAGNOSIS — G8929 Other chronic pain: Secondary | ICD-10-CM

## 2022-12-29 ENCOUNTER — Ambulatory Visit: Payer: Medicaid Other

## 2022-12-29 NOTE — Therapy (Deleted)
OUTPATIENT PHYSICAL THERAPY TREATMENT NOTE   Patient Name: Kathryn Andrews MRN: [id number] DOB:[birth year], 27 y.o., female Today's Date: 12/24/2022  PCP: Idamae Schuller, MD  REFERRING PROVIDER: Aldine Contes, MD   END OF SESSION:   PT End of Session - 12/24/22 1406     Visit Number 3    Number of Visits 9    Date for PT Re-Evaluation 01/29/23    Authorization Type MCD    PT Start Time 1400    PT Stop Time 1440    PT Time Calculation (min) 40 min    Activity Tolerance Patient tolerated treatment well    Behavior During Therapy [facility] for tasks assessed/performed              Past Medical History:  Diagnosis Date   Abdominal pain, epigastric 03/08/2021   Anxiety    Asthma    Attention deficit hyperactivity disorder    Bloating 03/08/2021   Chest pain    Chronic constipation    Chronic diarrhea 03/08/2021   Chronic lower back pain 04/02/2020   Class 3 severe obesity without serious comorbidity with body mass index (BMI) of 60.0 to 69.9 in adult [facility]) 12/15/2021   Depression    Functional dyspepsia 02/28/2020   Generalized abdominal pain 12/15/2021   Irritable bowel syndrome with diarrhea 12/15/2021   Morbid obesity ([place]) 03/02/2020   Urinary frequency 04/02/2020   Past Surgical History:  Procedure Laterality Date   adenoid removal     BIOPSY  08/12/2021   Procedure: BIOPSY;  Surgeon: Irving Copas., MD;  Location: [facility];  Service: Gastroenterology;;  EGD and COLON   COLONOSCOPY WITH PROPOFOL N/A 08/12/2021   Procedure: COLONOSCOPY WITH PROPOFOL;  Surgeon: Irving Copas., MD;  Location: [facility];  Service: Gastroenterology;  Laterality: N/A;   ESOPHAGOGASTRODUODENOSCOPY (EGD) WITH PROPOFOL N/A 08/12/2021   Procedure: ESOPHAGOGASTRODUODENOSCOPY (EGD) WITH PROPOFOL;  Surgeon: Rush Landmark Telford Nab., MD;  Location: [facility];  Service: Gastroenterology;  Laterality: N/A;   EXTERNAL EAR SURGERY     POLYPECTOMY  08/12/2021   Procedure: POLYPECTOMY;   Surgeon: Mansouraty, Telford Nab., MD;  Location: [facility];  Service: Gastroenterology;;   [place] EXTRACTION     Patient Active Problem List   Diagnosis Date Noted   Irritable bowel syndrome with both constipation and diarrhea 02/01/2022   Low back strain 11/18/2021   Anxiety and depression 04/02/2020   Healthcare maintenance 03/02/2020   Class 3 severe obesity with body mass index (BMI) of 60.0 to 69.9 in adult ([place]) 03/02/2020   Allergic rhinitis 03/02/2020    REFERRING DIAG: [id number] (ICD-10-CM) - Strain of lumbar region, initial encounter   THERAPY DIAG: Lumbar strain Low back pain chronic  Rationale for Evaluation and Treatment Rehabilitation  PERTINENT HISTORY: None  PRECAUTIONS: None  SUBJECTIVE:  SUBJECTIVE STATEMENT:  Reports no changes in lumbar symptoms.  Cited laying with pillows in the wrong position aggravates her back.   PAIN:  Are you having pain?  Yes: NPRS scale: 9/10 Pain location: low and mid back  Pain description: ache, cramp Aggravating factors: prolonged standing, bending, lifting Relieving factors: lying down    OBJECTIVE: (objective measures completed at initial evaluation unless otherwise dated)  DIAGNOSTIC FINDINGS:  none   PATIENT SURVEYS:  Modified Oswestry 18/50 36% perceived disability    SCREENING FOR RED FLAGS: negative   MUSCLE LENGTH: Hamstrings: Right 90 deg; Left 90 deg   POSTURE: rounded shoulders, forward head, decreased lumbar lordosis, and increased thoracic kyphosis   PALPATION: Deferred due to body habitus   LUMBAR ROM:    AROM eval  Flexion 90%  Extension 90%  Right lateral flexion 90%P!  Left lateral flexion 90%  Right rotation 90%  Left rotation 90%P!   (Blank rows = not tested)   LOWER EXTREMITY ROM:   WFL limited by  body habitus   Active  Right eval Left eval  Hip flexion      Hip extension      Hip abduction      Hip adduction      Hip internal rotation      Hip external rotation      Knee flexion      Knee extension      Ankle dorsiflexion      Ankle plantarflexion      Ankle inversion      Ankle eversion       (Blank rows = not tested)   LOWER EXTREMITY MMT:     MMT Right eval Left eval  Hip flexion 4 4  Hip extension 4 4  Hip abduction 4 4  Hip adduction      Hip internal rotation      Hip external rotation      Knee flexion      Knee extension 4 4  Ankle dorsiflexion      Ankle plantarflexion 4 4  Ankle inversion      Ankle eversion       (Blank rows = not tested)   LUMBAR SPECIAL TESTS:  Straight leg raise test: Negative, Slump test: Negative, and FABER test: deferred due to body habitus   FUNCTIONAL TESTS:  5 times sit to stand: 17s w/o UE Support   GAIT: Distance walked: 46ft x2 Assistive device utilized: None Level of assistance: Complete Independence   TODAY'S TREATMENT: [facility] Adult PT Treatment:                                                DATE: 12/24/22 Therapeutic Exercise: Nustep L2 6 min Seated sciatic stretch 10x B LTR(unable to hold QL stretch) 30s x2 B Supine march 15/15 90/90 30s hold only completing 1/3 trials holding 30s Seated hamstring stretch 30s x2 Bil Open book 10/10 Supine SLR 15/15  [facility] Adult PT Treatment:                                                DATE: 12/22/2022 Therapeutic Exercise: SLR x 10 each Bridge 2x10 STS 2x10 Pallof press 2x10 10# Standing hip abd/ext 2x10  30# KB deadlift to platform x 10 - 10#   PATIENT EDUCATION:  Education details: Discussed eval findings, rehab rationale and POC and patient is in agreement  Person educated: Patient Education method: Explanation Education comprehension: verbalized understanding and needs further education   HOME EXERCISE PROGRAM: Access Code: [email or web address] URL:  [email or web address] Date: 12/04/2022 Prepared by: Sharlynn Oliphant   Exercises - Supine Bridge  - 2 x daily - 5 x weekly - 2 sets - 10 reps - Supine 90/90 Abdominal Bracing  - 2 x daily - 5 x weekly - 2 sets - 2 reps - 30s hold   ASSESSMENT:   CLINICAL IMPRESSION: Began stretch and strength of trunk and Les adding core tasks, LE and hop strengthening, aerobic work.  Unable to maintain 90/90 position more than one attempt indicating core weakness.   OBJECTIVE IMPAIRMENTS: decreased activity tolerance, decreased knowledge of condition, decreased mobility, decreased strength, impaired perceived functional ability, obesity, and pain.    ACTIVITY LIMITATIONS: carrying, lifting, bending, and standing   PERSONAL FACTORS: Age, Past/current experiences, Time since onset of injury/illness/exacerbation, and previous PT for same condition  are also affecting patient's functional outcome.    GOALS: Goals reviewed with patient? No   SHORT TERM GOALS: Target date: 12/25/2022   Patient to demonstrate independence in HEP  Baseline: [id number] Goal status: INITIAL   2.  Patient to hold 90/90 position for 30s to demo increased core strength  Baseline: 12s Goal status: INITIAL       LONG TERM GOALS: Target date: 01/15/2023   Decrease ODI score to 14/50 to improve disability from moderate to minimal Baseline: 18/50 Goal status: INITIAL   2.  Increase LE strength to 4+/5 throughout Baseline:  MMT Right eval Left eval  Hip flexion 4 4  Hip extension 4 4  Hip abduction 4 4  Hip adduction      Hip internal rotation      Hip external rotation      Knee flexion      Knee extension 4 4  Ankle dorsiflexion      Ankle plantarflexion 4 4  Ankle inversion      Ankle eversion        Goal status: INITIAL   3.  Decrease 5x STS score to 15s arms crossed Baseline: 17s arms crossed Goal status: INITIAL   4.  Decrease worst pain to 6/10 Baseline: 9/10 Goal status: INITIAL        PLAN:   PT FREQUENCY: 2x/week   PT DURATION: 6 weeks   PLANNED INTERVENTIONS: Therapeutic exercises, Therapeutic activity, Neuromuscular re-education, Balance training, Gait training, Patient/Family education, Self Care, Joint mobilization, Dry Needling, Manual therapy, and Re-evaluation.   PLAN FOR NEXT SESSION: HEP review and update, flexibility and ROM, core strengthening, postural training, aerobic work   Lanice Shirts, PT 12/24/2022, 2:44 PM

## 2022-12-30 NOTE — Therapy (Deleted)
OUTPATIENT PHYSICAL THERAPY TREATMENT NOTE   Patient Name: Kathryn Andrews MRN: [id number] DOB:1995-12-16, 27 y.o., female Today's Date: 12/30/2022  PCP: Idamae Schuller, MD  REFERRING PROVIDER: Aldine Contes, MD   END OF SESSION:      Past Medical History:  Diagnosis Date   Abdominal pain, epigastric 03/08/2021   Anxiety    Asthma    Attention deficit hyperactivity disorder    Bloating 03/08/2021   Chest pain    Chronic constipation    Chronic diarrhea 03/08/2021   Chronic lower back pain 04/02/2020   Class 3 severe obesity without serious comorbidity with body mass index (BMI) of 60.0 to 69.9 in adult [facility]) 12/15/2021   Depression    Functional dyspepsia 02/28/2020   Generalized abdominal pain 12/15/2021   Irritable bowel syndrome with diarrhea 12/15/2021   Morbid obesity ([place]) 03/02/2020   Urinary frequency 04/02/2020   Past Surgical History:  Procedure Laterality Date   adenoid removal     BIOPSY  08/12/2021   Procedure: BIOPSY;  Surgeon: Irving Copas., MD;  Location: [facility];  Service: Gastroenterology;;  EGD and COLON   COLONOSCOPY WITH PROPOFOL N/A 08/12/2021   Procedure: COLONOSCOPY WITH PROPOFOL;  Surgeon: Irving Copas., MD;  Location: [facility];  Service: Gastroenterology;  Laterality: N/A;   ESOPHAGOGASTRODUODENOSCOPY (EGD) WITH PROPOFOL N/A 08/12/2021   Procedure: ESOPHAGOGASTRODUODENOSCOPY (EGD) WITH PROPOFOL;  Surgeon: Rush Landmark Telford Nab., MD;  Location: [facility];  Service: Gastroenterology;  Laterality: N/A;   EXTERNAL EAR SURGERY     POLYPECTOMY  08/12/2021   Procedure: POLYPECTOMY;  Surgeon: Mansouraty, Telford Nab., MD;  Location: [facility];  Service: Gastroenterology;;   [place] EXTRACTION     Patient Active Problem List   Diagnosis Date Noted   Irritable bowel syndrome with both constipation and diarrhea 02/01/2022   Low back strain 11/18/2021   Anxiety and depression 04/02/2020   Healthcare maintenance 03/02/2020    Class 3 severe obesity with body mass index (BMI) of 60.0 to 69.9 in adult ([place]) 03/02/2020   Allergic rhinitis 03/02/2020    REFERRING DIAG: [id number] (ICD-10-CM) - Strain of lumbar region, initial encounter   THERAPY DIAG: Lumbar strain Low back pain chronic  Rationale for Evaluation and Treatment Rehabilitation  PERTINENT HISTORY: None  PRECAUTIONS: None  SUBJECTIVE:                                                                                                                                                                                      SUBJECTIVE STATEMENT:  Reports no changes in lumbar symptoms.  Cited laying with pillows in the wrong position aggravates her back.   PAIN:  Are you having  pain?  Yes: NPRS scale: 9/10 Pain location: low and mid back  Pain description: ache, cramp Aggravating factors: prolonged standing, bending, lifting Relieving factors: lying down    OBJECTIVE: (objective measures completed at initial evaluation unless otherwise dated)  DIAGNOSTIC FINDINGS:  none   PATIENT SURVEYS:  Modified Oswestry 18/50 36% perceived disability    SCREENING FOR RED FLAGS: negative   MUSCLE LENGTH: Hamstrings: Right 90 deg; Left 90 deg   POSTURE: rounded shoulders, forward head, decreased lumbar lordosis, and increased thoracic kyphosis   PALPATION: Deferred due to body habitus   LUMBAR ROM:    AROM eval  Flexion 90%  Extension 90%  Right lateral flexion 90%P!  Left lateral flexion 90%  Right rotation 90%  Left rotation 90%P!   (Blank rows = not tested)   LOWER EXTREMITY ROM:   WFL limited by body habitus   Active  Right eval Left eval  Hip flexion      Hip extension      Hip abduction      Hip adduction      Hip internal rotation      Hip external rotation      Knee flexion      Knee extension      Ankle dorsiflexion      Ankle plantarflexion      Ankle inversion      Ankle eversion       (Blank rows = not tested)   LOWER  EXTREMITY MMT:     MMT Right eval Left eval  Hip flexion 4 4  Hip extension 4 4  Hip abduction 4 4  Hip adduction      Hip internal rotation      Hip external rotation      Knee flexion      Knee extension 4 4  Ankle dorsiflexion      Ankle plantarflexion 4 4  Ankle inversion      Ankle eversion       (Blank rows = not tested)   LUMBAR SPECIAL TESTS:  Straight leg raise test: Negative, Slump test: Negative, and FABER test: deferred due to body habitus   FUNCTIONAL TESTS:  5 times sit to stand: 17s w/o UE Support   GAIT: Distance walked: 82ft x2 Assistive device utilized: None Level of assistance: Complete Independence   TODAY'S TREATMENT: [facility] Adult PT Treatment:                                                DATE: 12/24/22 Therapeutic Exercise: Nustep L2 6 min Seated sciatic stretch 10x B LTR(unable to hold QL stretch) 30s x2 B Supine march 15/15 90/90 30s hold only completing 1/3 trials holding 30s Seated hamstring stretch 30s x2 Bil Open book 10/10 Supine SLR 15/15  [facility] Adult PT Treatment:                                                DATE: 12/22/2022 Therapeutic Exercise: SLR x 10 each Bridge 2x10 STS 2x10 Pallof press 2x10 10# Standing hip abd/ext 2x10 30# KB deadlift to platform x 10 - 10#   PATIENT EDUCATION:  Education details: Discussed eval findings, rehab rationale and POC and patient is in agreement  Person educated: Patient Education method: Explanation Education comprehension: verbalized understanding and needs further education   HOME EXERCISE PROGRAM: Access Code: [email or web address] URL: [email or web address] Date: 12/04/2022 Prepared by: Sharlynn Oliphant   Exercises - Supine Bridge  - 2 x daily - 5 x weekly - 2 sets - 10 reps - Supine 90/90 Abdominal Bracing  - 2 x daily - 5 x weekly - 2 sets - 2 reps - 30s hold   ASSESSMENT:   CLINICAL IMPRESSION: Began stretch and strength of trunk and Les adding core tasks, LE and hop  strengthening, aerobic work.  Unable to maintain 90/90 position more than one attempt indicating core weakness.   OBJECTIVE IMPAIRMENTS: decreased activity tolerance, decreased knowledge of condition, decreased mobility, decreased strength, impaired perceived functional ability, obesity, and pain.    ACTIVITY LIMITATIONS: carrying, lifting, bending, and standing   PERSONAL FACTORS: Age, Past/current experiences, Time since onset of injury/illness/exacerbation, and previous PT for same condition  are also affecting patient's functional outcome.    GOALS: Goals reviewed with patient? No   SHORT TERM GOALS: Target date: 12/25/2022   Patient to demonstrate independence in HEP  Baseline: [id number] Goal status: INITIAL   2.  Patient to hold 90/90 position for 30s to demo increased core strength  Baseline: 12s Goal status: INITIAL       LONG TERM GOALS: Target date: 01/15/2023   Decrease ODI score to 14/50 to improve disability from moderate to minimal Baseline: 18/50 Goal status: INITIAL   2.  Increase LE strength to 4+/5 throughout Baseline:  MMT Right eval Left eval  Hip flexion 4 4  Hip extension 4 4  Hip abduction 4 4  Hip adduction      Hip internal rotation      Hip external rotation      Knee flexion      Knee extension 4 4  Ankle dorsiflexion      Ankle plantarflexion 4 4  Ankle inversion      Ankle eversion        Goal status: INITIAL   3.  Decrease 5x STS score to 15s arms crossed Baseline: 17s arms crossed Goal status: INITIAL   4.  Decrease worst pain to 6/10 Baseline: 9/10 Goal status: INITIAL       PLAN:   PT FREQUENCY: 2x/week   PT DURATION: 6 weeks   PLANNED INTERVENTIONS: Therapeutic exercises, Therapeutic activity, Neuromuscular re-education, Balance training, Gait training, Patient/Family education, Self Care, Joint mobilization, Dry Needling, Manual therapy, and Re-evaluation.   PLAN FOR NEXT SESSION: HEP review and update, flexibility and  ROM, core strengthening, postural training, aerobic work   Lanice Shirts, PT 12/30/2022, 2:34 PM

## 2022-12-31 ENCOUNTER — Ambulatory Visit: Payer: Medicaid Other

## 2023-01-05 NOTE — Therapy (Unsigned)
OUTPATIENT PHYSICAL THERAPY TREATMENT NOTE   Patient Name: Kathryn Andrews MRN: [id number] DOB:1996-04-06, 27 y.o., female Today's Date: 01/06/2023  PCP: Gwenevere Abbot, MD  REFERRING PROVIDER: Earl Lagos, MD   END OF SESSION:   PT End of Session - 01/06/23 1358     Visit Number 4    Number of Visits 9    Date for PT Re-Evaluation 01/29/23    Authorization Type MCD    PT Start Time 1315    PT Stop Time 1400    PT Time Calculation (min) 45 min    Activity Tolerance Patient tolerated treatment well    Behavior During Therapy [facility] for tasks assessed/performed               Past Medical History:  Diagnosis Date   Abdominal pain, epigastric 03/08/2021   Anxiety    Asthma    Attention deficit hyperactivity disorder    Bloating 03/08/2021   Chest pain    Chronic constipation    Chronic diarrhea 03/08/2021   Chronic lower back pain 04/02/2020   Class 3 severe obesity without serious comorbidity with body mass index (BMI) of 60.0 to 69.9 in adult 12/15/2021   Depression    Functional dyspepsia 02/28/2020   Generalized abdominal pain 12/15/2021   Irritable bowel syndrome with diarrhea 12/15/2021   Morbid obesity 03/02/2020   Urinary frequency 04/02/2020   Past Surgical History:  Procedure Laterality Date   adenoid removal     BIOPSY  08/12/2021   Procedure: BIOPSY;  Surgeon: Lemar Lofty., MD;  Location: [facility];  Service: Gastroenterology;;  EGD and COLON   COLONOSCOPY WITH PROPOFOL N/A 08/12/2021   Procedure: COLONOSCOPY WITH PROPOFOL;  Surgeon: Lemar Lofty., MD;  Location: [facility];  Service: Gastroenterology;  Laterality: N/A;   ESOPHAGOGASTRODUODENOSCOPY (EGD) WITH PROPOFOL N/A 08/12/2021   Procedure: ESOPHAGOGASTRODUODENOSCOPY (EGD) WITH PROPOFOL;  Surgeon: Meridee Score Netty Starring., MD;  Location: [facility];  Service: Gastroenterology;  Laterality: N/A;   EXTERNAL EAR SURGERY     POLYPECTOMY  08/12/2021   Procedure: POLYPECTOMY;  Surgeon:  Mansouraty, Netty Starring., MD;  Location: [facility];  Service: Gastroenterology;;   WISDOM TOOTH EXTRACTION     Patient Active Problem List   Diagnosis Date Noted   Irritable bowel syndrome with both constipation and diarrhea 02/01/2022   Low back strain 11/18/2021   Anxiety and depression 04/02/2020   Healthcare maintenance 03/02/2020   Class 3 severe obesity with body mass index (BMI) of 60.0 to 69.9 in adult 03/02/2020   Allergic rhinitis 03/02/2020    REFERRING DIAG: S39.012A (ICD-10-CM) - Strain of lumbar region, initial encounter   THERAPY DIAG: Lumbar strain Low back pain chronic  Rationale for Evaluation and Treatment Rehabilitation  PERTINENT HISTORY: None  PRECAUTIONS: None  SUBJECTIVE:  SUBJECTIVE STATEMENT:  Low back pain ranges from 3-8/10.  Worse with prolonged standing and walking, has not felt any sciatic symptoms since last session.   PAIN:  Are you having pain?  Yes: NPRS scale: 8/10 Pain location: low and mid back  Pain description: ache, cramp Aggravating factors: prolonged standing, bending, lifting Relieving factors: lying down    OBJECTIVE: (objective measures completed at initial evaluation unless otherwise dated)  DIAGNOSTIC FINDINGS:  none   PATIENT SURVEYS:  Modified Oswestry 18/50 36% perceived disability    SCREENING FOR RED FLAGS: negative   MUSCLE LENGTH: Hamstrings: Right 90 deg; Left 90 deg   POSTURE: rounded shoulders, forward head, decreased lumbar lordosis, and increased thoracic kyphosis   PALPATION: Deferred due to body habitus   LUMBAR ROM:    AROM eval  Flexion 90%  Extension 90%  Right lateral flexion 90%P!  Left lateral flexion 90%  Right rotation 90%  Left rotation 90%P!   (Blank rows = not tested)   LOWER EXTREMITY ROM:   WFL  limited by body habitus   Active  Right eval Left eval  Hip flexion      Hip extension      Hip abduction      Hip adduction      Hip internal rotation      Hip external rotation      Knee flexion      Knee extension      Ankle dorsiflexion      Ankle plantarflexion      Ankle inversion      Ankle eversion       (Blank rows = not tested)   LOWER EXTREMITY MMT:     MMT Right eval Left eval  Hip flexion 4 4  Hip extension 4 4  Hip abduction 4 4  Hip adduction      Hip internal rotation      Hip external rotation      Knee flexion      Knee extension 4 4  Ankle dorsiflexion      Ankle plantarflexion 4 4  Ankle inversion      Ankle eversion       (Blank rows = not tested)   LUMBAR SPECIAL TESTS:  Straight leg raise test: Negative, Slump test: Negative, and FABER test: deferred due to body habitus   FUNCTIONAL TESTS:  5 times sit to stand: 17s w/o UE Support   GAIT: Distance walked: 17ft x2 Assistive device utilized: None Level of assistance: Complete Independence   TODAY'S TREATMENT: [facility] Adult PT Treatment:                                                DATE: 01/06/23 Therapeutic Exercise: Treadmill 8 min 0.8-1.0 MPH Seated sciatic stretch 10x B QL stretch 30s x2 B Supine march 15/15 w/PPT 90/90 30s x3 (holding 1/3 trials) PPT 3s 10x Open book 10/10 Supine hip flexor stretch with bolster 60s x2 Bil   [facility] Adult PT Treatment:                                                DATE: 12/24/22 Therapeutic Exercise: Nustep L2 6 min Seated sciatic stretch 10x B LTR(unable to hold  QL stretch) 30s x2 B Supine march 15/15 90/90 30s hold only completing 1/3 trials holding 30s Seated hamstring stretch 30s x2 Bil Open book 10/10 Supine SLR 15/15  [facility] Adult PT Treatment:                                                DATE: 12/22/2022 Therapeutic Exercise: SLR x 10 each Bridge 2x10 STS 2x10 Pallof press 2x10 10# Standing hip abd/ext 2x10 30# KB deadlift to  platform x 10 - 10#   PATIENT EDUCATION:  Education details: Discussed eval findings, rehab rationale and POC and patient is in agreement  Person educated: Patient Education method: Explanation Education comprehension: verbalized understanding and needs further education   HOME EXERCISE PROGRAM: Access Code: [email or web address] URL: [email or web address] Date: 12/04/2022 Prepared by: Gustavus Bryant   Exercises - Supine Bridge  - 2 x daily - 5 x weekly - 2 sets - 10 reps - Supine 90/90 Abdominal Bracing  - 2 x daily - 5 x weekly - 2 sets - 2 reps - 30s hold   ASSESSMENT:   CLINICAL IMPRESSION: LE symptoms have resolved and no sciatic reported.  Incorporated treadmill walking for aerobic and functional task.  Better tolerance to QL stretches today.  Added additional core strengthening tasks as well as hip flexor stretching.  Core weakness prohibits patient from completing 3 trials of 90/90 position   OBJECTIVE IMPAIRMENTS: decreased activity tolerance, decreased knowledge of condition, decreased mobility, decreased strength, impaired perceived functional ability, obesity, and pain.    ACTIVITY LIMITATIONS: carrying, lifting, bending, and standing   PERSONAL FACTORS: Age, Past/current experiences, Time since onset of injury/illness/exacerbation, and previous PT for same condition  are also affecting patient's functional outcome.    GOALS: Goals reviewed with patient? No   SHORT TERM GOALS: Target date: 12/25/2022   Patient to demonstrate independence in HEP  Baseline: [id number] Goal status: INITIAL   2.  Patient to hold 90/90 position for 30s to demo increased core strength  Baseline: 12s Goal status: INITIAL       LONG TERM GOALS: Target date: 01/15/2023   Decrease ODI score to 14/50 to improve disability from moderate to minimal Baseline: 18/50 Goal status: INITIAL   2.  Increase LE strength to 4+/5 throughout Baseline:  MMT Right eval Left eval  Hip flexion 4 4   Hip extension 4 4  Hip abduction 4 4  Hip adduction      Hip internal rotation      Hip external rotation      Knee flexion      Knee extension 4 4  Ankle dorsiflexion      Ankle plantarflexion 4 4  Ankle inversion      Ankle eversion        Goal status: INITIAL   3.  Decrease 5x STS score to 15s arms crossed Baseline: 17s arms crossed Goal status: INITIAL   4.  Decrease worst pain to 6/10 Baseline: 9/10 Goal status: INITIAL       PLAN:   PT FREQUENCY: 2x/week   PT DURATION: 6 weeks   PLANNED INTERVENTIONS: Therapeutic exercises, Therapeutic activity, Neuromuscular re-education, Balance training, Gait training, Patient/Family education, Self Care, Joint mobilization, Dry Needling, Manual therapy, and Re-evaluation.   PLAN FOR NEXT SESSION: HEP review and update, flexibility and ROM, core strengthening, postural training, aerobic work   [employer]  Rondel Baton, PT 01/06/2023, 2:00 PM

## 2023-01-06 ENCOUNTER — Ambulatory Visit: Payer: Medicaid Other | Attending: Internal Medicine

## 2023-01-06 DIAGNOSIS — M6281 Muscle weakness (generalized): Secondary | ICD-10-CM | POA: Insufficient documentation

## 2023-01-06 DIAGNOSIS — R2689 Other abnormalities of gait and mobility: Secondary | ICD-10-CM | POA: Diagnosis present

## 2023-01-06 DIAGNOSIS — M545 Low back pain, unspecified: Secondary | ICD-10-CM | POA: Diagnosis present

## 2023-01-06 DIAGNOSIS — G8929 Other chronic pain: Secondary | ICD-10-CM | POA: Insufficient documentation

## 2023-01-08 ENCOUNTER — Ambulatory Visit: Payer: Medicaid Other

## 2023-01-08 DIAGNOSIS — M6281 Muscle weakness (generalized): Secondary | ICD-10-CM

## 2023-01-08 DIAGNOSIS — M545 Low back pain, unspecified: Secondary | ICD-10-CM | POA: Diagnosis not present

## 2023-01-08 DIAGNOSIS — R2689 Other abnormalities of gait and mobility: Secondary | ICD-10-CM

## 2023-01-08 DIAGNOSIS — G8929 Other chronic pain: Secondary | ICD-10-CM

## 2023-01-08 NOTE — Therapy (Signed)
OUTPATIENT PHYSICAL THERAPY TREATMENT NOTE   Patient Name: Kathryn Andrews MRN: [id number] DOB:1996-02-22, 27 y.o., female Today's Date: 01/08/2023  PCP: Idamae Schuller, MD  REFERRING PROVIDER: Aldine Contes, MD   END OF SESSION:   PT End of Session - 01/08/23 1429     Visit Number 5    Number of Visits 9    Date for PT Re-Evaluation 01/29/23    Authorization Type MCD    PT Start Time 1440    PT Stop Time 1520    PT Time Calculation (min) 40 min    Activity Tolerance Patient tolerated treatment well    Behavior During Therapy [facility] for tasks assessed/performed                Past Medical History:  Diagnosis Date   Abdominal pain, epigastric 03/08/2021   Anxiety    Asthma    Attention deficit hyperactivity disorder    Bloating 03/08/2021   Chest pain    Chronic constipation    Chronic diarrhea 03/08/2021   Chronic lower back pain 04/02/2020   Class 3 severe obesity without serious comorbidity with body mass index (BMI) of 60.0 to 69.9 in adult 12/15/2021   Depression    Functional dyspepsia 02/28/2020   Generalized abdominal pain 12/15/2021   Irritable bowel syndrome with diarrhea 12/15/2021   Morbid obesity 03/02/2020   Urinary frequency 04/02/2020   Past Surgical History:  Procedure Laterality Date   adenoid removal     BIOPSY  08/12/2021   Procedure: BIOPSY;  Surgeon: Irving Copas., MD;  Location: [facility];  Service: Gastroenterology;;  EGD and COLON   COLONOSCOPY WITH PROPOFOL N/A 08/12/2021   Procedure: COLONOSCOPY WITH PROPOFOL;  Surgeon: Irving Copas., MD;  Location: [facility];  Service: Gastroenterology;  Laterality: N/A;   ESOPHAGOGASTRODUODENOSCOPY (EGD) WITH PROPOFOL N/A 08/12/2021   Procedure: ESOPHAGOGASTRODUODENOSCOPY (EGD) WITH PROPOFOL;  Surgeon: Rush Landmark Telford Nab., MD;  Location: [facility];  Service: Gastroenterology;  Laterality: N/A;   EXTERNAL EAR SURGERY     POLYPECTOMY  08/12/2021   Procedure: POLYPECTOMY;  Surgeon:  Mansouraty, Telford Nab., MD;  Location: [facility];  Service: Gastroenterology;;   Winnie EXTRACTION     Patient Active Problem List   Diagnosis Date Noted   Irritable bowel syndrome with both constipation and diarrhea 02/01/2022   Low back strain 11/18/2021   Anxiety and depression 04/02/2020   Healthcare maintenance 03/02/2020   Class 3 severe obesity with body mass index (BMI) of 60.0 to 69.9 in adult 03/02/2020   Allergic rhinitis 03/02/2020    REFERRING DIAG: S39.012A (ICD-10-CM) - Strain of lumbar region, initial encounter   THERAPY DIAG: Lumbar strain Low back pain chronic  Rationale for Evaluation and Treatment Rehabilitation  PERTINENT HISTORY: None  PRECAUTIONS: None  SUBJECTIVE:  SUBJECTIVE STATEMENT:  Pt presents to PT with reports of decreased pain and discomfort. Has been compliant with HEP with no adverse effect.   PAIN:  Are you having pain?  Yes: NPRS scale: 0/10 Pain location: low and mid back  Pain description: ache, cramp Aggravating factors: prolonged standing, bending, lifting Relieving factors: lying down    OBJECTIVE: (objective measures completed at initial evaluation unless otherwise dated)  DIAGNOSTIC FINDINGS:  none   PATIENT SURVEYS:  Modified Oswestry 18/50 36% perceived disability    SCREENING FOR RED FLAGS: negative   MUSCLE LENGTH: Hamstrings: Right 90 deg; Left 90 deg   POSTURE: rounded shoulders, forward head, decreased lumbar lordosis, and increased thoracic kyphosis   PALPATION: Deferred due to body habitus   LUMBAR ROM:    AROM eval  Flexion 90%  Extension 90%  Right lateral flexion 90%P!  Left lateral flexion 90%  Right rotation 90%  Left rotation 90%P!   (Blank rows = not tested)   LOWER EXTREMITY ROM:   WFL limited by body  habitus   Active  Right eval Left eval  Hip flexion      Hip extension      Hip abduction      Hip adduction      Hip internal rotation      Hip external rotation      Knee flexion      Knee extension      Ankle dorsiflexion      Ankle plantarflexion      Ankle inversion      Ankle eversion       (Blank rows = not tested)   LOWER EXTREMITY MMT:     MMT Right eval Left eval  Hip flexion 4 4  Hip extension 4 4  Hip abduction 4 4  Hip adduction      Hip internal rotation      Hip external rotation      Knee flexion      Knee extension 4 4  Ankle dorsiflexion      Ankle plantarflexion 4 4  Ankle inversion      Ankle eversion       (Blank rows = not tested)   LUMBAR SPECIAL TESTS:  Straight leg raise test: Negative, Slump test: Negative, and FABER test: deferred due to body habitus   FUNCTIONAL TESTS:  5 times sit to stand: 17s w/o UE Support   GAIT: Distance walked: 56ft x2 Assistive device utilized: None Level of assistance: Complete Independence   TODAY'S TREATMENT: [facility] Adult PT Treatment:                                                DATE: 01/08/23 Therapeutic Exercise: NuStep lvl 5 x 5 min while taking subjective  Seated sciatic stretch 10x ea Supine march 10/10 w/PPT 90/90 30s x 2  Bridge 2x15 KB DL 2x10 10# - 8in platform Standing chop 2x10 10# Standing hip abd 2x10 37.5#  [facility] Adult PT Treatment:                                                DATE: 01/06/23 Therapeutic Exercise: Treadmill 8 min 0.8-1.0 MPH Seated sciatic stretch 10x B QL stretch 30s  x2 B Supine march 15/15 w/PPT 90/90 30s x3 (holding 1/3 trials) PPT 3s 10x Open book 10/10 Supine hip flexor stretch with bolster 60s x2 Bil   [facility] Adult PT Treatment:                                                DATE: 12/24/22 Therapeutic Exercise: Nustep L2 6 min Seated sciatic stretch 10x B LTR(unable to hold QL stretch) 30s x2 B Supine march 15/15 90/90 30s hold only completing 1/3  trials holding 30s Seated hamstring stretch 30s x2 Bil Open book 10/10 Supine SLR 15/15  [facility] Adult PT Treatment:                                                DATE: 12/22/2022 Therapeutic Exercise: SLR x 10 each Bridge 2x10 STS 2x10 Pallof press 2x10 10# Standing hip abd/ext 2x10 30# KB deadlift to platform x 10 - 10#   PATIENT EDUCATION:  Education details: Discussed eval findings, rehab rationale and POC and patient is in agreement  Person educated: Patient Education method: Explanation Education comprehension: verbalized understanding and needs further education   HOME EXERCISE PROGRAM: Access Code: [email or web address] URL: [email or web address] Date: 12/04/2022 Prepared by: Sharlynn Oliphant   Exercises - Supine Bridge  - 2 x daily - 5 x weekly - 2 sets - 10 reps - Supine 90/90 Abdominal Bracing  - 2 x daily - 5 x weekly - 2 sets - 2 reps - 30s hold   ASSESSMENT:   CLINICAL IMPRESSION: Pt was able to complete all prescribed exercises with no adverse effect or increase in pain. Therapy focused on improving core and proximal hip strength in order to decrease pain and improve comfort. Pt continues to benefit from skilled PT, will continue per POC as prescribed and progress as able.    OBJECTIVE IMPAIRMENTS: decreased activity tolerance, decreased knowledge of condition, decreased mobility, decreased strength, impaired perceived functional ability, obesity, and pain.    ACTIVITY LIMITATIONS: carrying, lifting, bending, and standing   PERSONAL FACTORS: Age, Past/current experiences, Time since onset of injury/illness/exacerbation, and previous PT for same condition  are also affecting patient's functional outcome.    GOALS: Goals reviewed with patient? No   SHORT TERM GOALS: Target date: 12/25/2022   Patient to demonstrate independence in HEP  Baseline: [id number] Goal status: INITIAL   2.  Patient to hold 90/90 position for 30s to demo increased core strength   Baseline: 12s Goal status: INITIAL       LONG TERM GOALS: Target date: 01/15/2023   Decrease ODI score to 14/50 to improve disability from moderate to minimal Baseline: 18/50 Goal status: INITIAL   2.  Increase LE strength to 4+/5 throughout Baseline:  MMT Right eval Left eval  Hip flexion 4 4  Hip extension 4 4  Hip abduction 4 4  Hip adduction      Hip internal rotation      Hip external rotation      Knee flexion      Knee extension 4 4  Ankle dorsiflexion      Ankle plantarflexion 4 4  Ankle inversion      Ankle eversion        Goal status: INITIAL  3.  Decrease 5x STS score to 15s arms crossed Baseline: 17s arms crossed Goal status: INITIAL   4.  Decrease worst pain to 6/10 Baseline: 9/10 Goal status: INITIAL       PLAN:   PT FREQUENCY: 2x/week   PT DURATION: 6 weeks   PLANNED INTERVENTIONS: Therapeutic exercises, Therapeutic activity, Neuromuscular re-education, Balance training, Gait training, Patient/Family education, Self Care, Joint mobilization, Dry Needling, Manual therapy, and Re-evaluation.   PLAN FOR NEXT SESSION: HEP review and update, flexibility and ROM, core strengthening, postural training, aerobic work   Ward Chatters, PT 01/08/2023, 3:21 PM

## 2023-01-13 ENCOUNTER — Ambulatory Visit: Payer: Medicaid Other

## 2023-01-13 NOTE — Therapy (Unsigned)
OUTPATIENT PHYSICAL THERAPY TREATMENT NOTE   Patient Name: Kathryn Andrews MRN: [id number] DOB:1996-08-19, 27 y.o., female Today's Date: 01/13/2023  PCP: Gwenevere Abbot, MD  REFERRING PROVIDER: Earl Lagos, MD   END OF SESSION:        Past Medical History:  Diagnosis Date   Abdominal pain, epigastric 03/08/2021   Anxiety    Asthma    Attention deficit hyperactivity disorder    Bloating 03/08/2021   Chest pain    Chronic constipation    Chronic diarrhea 03/08/2021   Chronic lower back pain 04/02/2020   Class 3 severe obesity without serious comorbidity with body mass index (BMI) of 60.0 to 69.9 in adult 12/15/2021   Depression    Functional dyspepsia 02/28/2020   Generalized abdominal pain 12/15/2021   Irritable bowel syndrome with diarrhea 12/15/2021   Morbid obesity 03/02/2020   Urinary frequency 04/02/2020   Past Surgical History:  Procedure Laterality Date   adenoid removal     BIOPSY  08/12/2021   Procedure: BIOPSY;  Surgeon: Lemar Lofty., MD;  Location: [facility];  Service: Gastroenterology;;  EGD and COLON   COLONOSCOPY WITH PROPOFOL N/A 08/12/2021   Procedure: COLONOSCOPY WITH PROPOFOL;  Surgeon: Lemar Lofty., MD;  Location: [facility];  Service: Gastroenterology;  Laterality: N/A;   ESOPHAGOGASTRODUODENOSCOPY (EGD) WITH PROPOFOL N/A 08/12/2021   Procedure: ESOPHAGOGASTRODUODENOSCOPY (EGD) WITH PROPOFOL;  Surgeon: Meridee Score Netty Starring., MD;  Location: [facility];  Service: Gastroenterology;  Laterality: N/A;   EXTERNAL EAR SURGERY     POLYPECTOMY  08/12/2021   Procedure: POLYPECTOMY;  Surgeon: Mansouraty, Netty Starring., MD;  Location: [facility];  Service: Gastroenterology;;   WISDOM TOOTH EXTRACTION     Patient Active Problem List   Diagnosis Date Noted   Irritable bowel syndrome with both constipation and diarrhea 02/01/2022   Low back strain 11/18/2021   Anxiety and depression 04/02/2020   Healthcare maintenance 03/02/2020   Class 3  severe obesity with body mass index (BMI) of 60.0 to 69.9 in adult 03/02/2020   Allergic rhinitis 03/02/2020    REFERRING DIAG: S39.012A (ICD-10-CM) - Strain of lumbar region, initial encounter   THERAPY DIAG: Lumbar strain Low back pain chronic  Rationale for Evaluation and Treatment Rehabilitation  PERTINENT HISTORY: None  PRECAUTIONS: None  SUBJECTIVE:                                                                                                                                                                                      SUBJECTIVE STATEMENT:  Pt presents to PT with reports of decreased pain and discomfort. Has been compliant with HEP with no adverse effect.   PAIN:  Are you  having pain?  Yes: NPRS scale: 0/10 Pain location: low and mid back  Pain description: ache, cramp Aggravating factors: prolonged standing, bending, lifting Relieving factors: lying down    OBJECTIVE: (objective measures completed at initial evaluation unless otherwise dated)  DIAGNOSTIC FINDINGS:  none   PATIENT SURVEYS:  Modified Oswestry 18/50 36% perceived disability    SCREENING FOR RED FLAGS: negative   MUSCLE LENGTH: Hamstrings: Right 90 deg; Left 90 deg   POSTURE: rounded shoulders, forward head, decreased lumbar lordosis, and increased thoracic kyphosis   PALPATION: Deferred due to body habitus   LUMBAR ROM:    AROM eval  Flexion 90%  Extension 90%  Right lateral flexion 90%P!  Left lateral flexion 90%  Right rotation 90%  Left rotation 90%P!   (Blank rows = not tested)   LOWER EXTREMITY ROM:   WFL limited by body habitus   Active  Right eval Left eval  Hip flexion      Hip extension      Hip abduction      Hip adduction      Hip internal rotation      Hip external rotation      Knee flexion      Knee extension      Ankle dorsiflexion      Ankle plantarflexion      Ankle inversion      Ankle eversion       (Blank rows = not tested)   LOWER EXTREMITY  MMT:     MMT Right eval Left eval  Hip flexion 4 4  Hip extension 4 4  Hip abduction 4 4  Hip adduction      Hip internal rotation      Hip external rotation      Knee flexion      Knee extension 4 4  Ankle dorsiflexion      Ankle plantarflexion 4 4  Ankle inversion      Ankle eversion       (Blank rows = not tested)   LUMBAR SPECIAL TESTS:  Straight leg raise test: Negative, Slump test: Negative, and FABER test: deferred due to body habitus   FUNCTIONAL TESTS:  5 times sit to stand: 17s w/o UE Support   GAIT: Distance walked: 8175ft x2 Assistive device utilized: None Level of assistance: Complete Independence   TODAY'S TREATMENT: [facility] Adult PT Treatment:                                                DATE: 01/08/23 Therapeutic Exercise: NuStep lvl 5 x 5 min while taking subjective  Seated sciatic stretch 10x ea Supine march 10/10 w/PPT 90/90 30s x 2  Bridge 2x15 KB DL [id number] 11#10# - 8in platform Standing chop 2x10 10# Standing hip abd 2x10 37.5#  [facility] Adult PT Treatment:                                                DATE: 01/06/23 Therapeutic Exercise: Treadmill 8 min 0.8-1.0 MPH Seated sciatic stretch 10x B QL stretch 30s x2 B Supine march 15/15 w/PPT 90/90 30s x3 (holding 1/3 trials) PPT 3s 10x Open book 10/10 Supine hip flexor stretch with bolster 60s x2 Bil  [facility] Adult PT Treatment:                                                DATE: 12/24/22 Therapeutic Exercise: Nustep L2 6 min Seated sciatic stretch 10x B LTR(unable to hold QL stretch) 30s x2 B Supine march 15/15 90/90 30s hold only completing 1/3 trials holding 30s Seated hamstring stretch 30s x2 Bil Open book 10/10 Supine SLR 15/15  [facility] Adult PT Treatment:                                                DATE: 12/22/2022 Therapeutic Exercise: SLR x 10 each Bridge 2x10 STS 2x10 Pallof press 2x10 10# Standing hip abd/ext 2x10 30# KB deadlift to platform x 10 - 10#   PATIENT EDUCATION:   Education details: Discussed eval findings, rehab rationale and POC and patient is in agreement  Person educated: Patient Education method: Explanation Education comprehension: verbalized understanding and needs further education   HOME EXERCISE PROGRAM: Access Code: [email or web address] URL: [email or web address] Date: 12/04/2022 Prepared by: Gustavus Bryant   Exercises - Supine Bridge  - 2 x daily - 5 x weekly - 2 sets - 10 reps - Supine 90/90 Abdominal Bracing  - 2 x daily - 5 x weekly - 2 sets - 2 reps - 30s hold   ASSESSMENT:   CLINICAL IMPRESSION: Pt was able to complete all prescribed exercises with no adverse effect or increase in pain. Therapy focused on improving core and proximal hip strength in order to decrease pain and improve comfort. Pt continues to benefit from skilled PT, will continue per POC as prescribed and progress as able.    OBJECTIVE IMPAIRMENTS: decreased activity tolerance, decreased knowledge of condition, decreased mobility, decreased strength, impaired perceived functional ability, obesity, and pain.    ACTIVITY LIMITATIONS: carrying, lifting, bending, and standing   PERSONAL FACTORS: Age, Past/current experiences, Time since onset of injury/illness/exacerbation, and previous PT for same condition  are also affecting patient's functional outcome.    GOALS: Goals reviewed with patient? No   SHORT TERM GOALS: Target date: 12/25/2022   Patient to demonstrate independence in HEP  Baseline: [id number] Goal status: INITIAL   2.  Patient to hold 90/90 position for 30s to demo increased core strength  Baseline: 12s Goal status: INITIAL       LONG TERM GOALS: Target date: 01/15/2023   Decrease ODI score to 14/50 to improve disability from moderate to minimal Baseline: 18/50 Goal status: INITIAL   2.  Increase LE strength to 4+/5 throughout Baseline:  MMT Right eval Left eval  Hip flexion 4 4  Hip extension 4 4  Hip abduction 4 4  Hip  adduction      Hip internal rotation      Hip external rotation      Knee flexion      Knee extension 4 4  Ankle dorsiflexion      Ankle plantarflexion 4 4  Ankle inversion      Ankle eversion        Goal status: INITIAL   3.  Decrease 5x STS score to 15s arms crossed Baseline: 17s arms crossed Goal status: INITIAL   4.  Decrease worst pain to 6/10 Baseline:  9/10 Goal status: INITIAL       PLAN:   PT FREQUENCY: 2x/week   PT DURATION: 6 weeks   PLANNED INTERVENTIONS: Therapeutic exercises, Therapeutic activity, Neuromuscular re-education, Balance training, Gait training, Patient/Family education, Self Care, Joint mobilization, Dry Needling, Manual therapy, and Re-evaluation.   PLAN FOR NEXT SESSION: HEP review and update, flexibility and ROM, core strengthening, postural training, aerobic work   Hildred Laser, PT 01/13/2023, 10:52 AM

## 2023-01-14 ENCOUNTER — Ambulatory Visit: Payer: Medicaid Other

## 2023-01-14 DIAGNOSIS — M6281 Muscle weakness (generalized): Secondary | ICD-10-CM

## 2023-01-14 DIAGNOSIS — M545 Low back pain, unspecified: Secondary | ICD-10-CM | POA: Diagnosis not present

## 2023-01-14 DIAGNOSIS — G8929 Other chronic pain: Secondary | ICD-10-CM

## 2023-01-14 DIAGNOSIS — R2689 Other abnormalities of gait and mobility: Secondary | ICD-10-CM

## 2023-01-15 ENCOUNTER — Ambulatory Visit: Payer: Medicaid Other

## 2023-01-15 DIAGNOSIS — G8929 Other chronic pain: Secondary | ICD-10-CM

## 2023-01-15 DIAGNOSIS — R2689 Other abnormalities of gait and mobility: Secondary | ICD-10-CM

## 2023-01-15 DIAGNOSIS — M6281 Muscle weakness (generalized): Secondary | ICD-10-CM

## 2023-01-15 DIAGNOSIS — M545 Low back pain, unspecified: Secondary | ICD-10-CM | POA: Diagnosis not present

## 2023-01-15 NOTE — Therapy (Addendum)
OUTPATIENT PHYSICAL THERAPY TREATMENT NOTE/DC SUMMARY   Patient Name: Kathryn Andrews MRN: [id number] DOB:[birth year], 27 y.o., female Today's Date: 01/15/2023  PCP: Gwenevere Abbot, MD  REFERRING PROVIDER: Earl Lagos, MD  PHYSICAL THERAPY DISCHARGE SUMMARY  Visits from Start of Care: 7  Current functional level related to goals / functional outcomes: UTA   Remaining deficits: UTA   Education / Equipment: HEP   Patient agrees to discharge. Patient goals were partially met. Patient is being discharged due to not returning since the last visit.  END OF SESSION:   PT End of Session - 01/15/23 1401     Visit Number 7    Number of Visits 9    Date for PT Re-Evaluation 01/29/23    Authorization Type MCD    PT Start Time 1400    PT Stop Time 1440    PT Time Calculation (min) 40 min    Activity Tolerance Patient tolerated treatment well    Behavior During Therapy [facility] for tasks assessed/performed                 Past Medical History:  Diagnosis Date   Abdominal pain, epigastric 03/08/2021   Anxiety    Asthma    Attention deficit hyperactivity disorder    Bloating 03/08/2021   Chest pain    Chronic constipation    Chronic diarrhea 03/08/2021   Chronic lower back pain 04/02/2020   Class 3 severe obesity without serious comorbidity with body mass index (BMI) of 60.0 to 69.9 in adult 12/15/2021   Depression    Functional dyspepsia 02/28/2020   Generalized abdominal pain 12/15/2021   Irritable bowel syndrome with diarrhea 12/15/2021   Morbid obesity 03/02/2020   Urinary frequency 04/02/2020   Past Surgical History:  Procedure Laterality Date   adenoid removal     BIOPSY  08/12/2021   Procedure: BIOPSY;  Surgeon: Lemar Lofty., MD;  Location: [facility];  Service: Gastroenterology;;  EGD and COLON   COLONOSCOPY WITH PROPOFOL N/A 08/12/2021   Procedure: COLONOSCOPY WITH PROPOFOL;  Surgeon: Lemar Lofty., MD;  Location: [facility];  Service:  Gastroenterology;  Laterality: N/A;   ESOPHAGOGASTRODUODENOSCOPY (EGD) WITH PROPOFOL N/A 08/12/2021   Procedure: ESOPHAGOGASTRODUODENOSCOPY (EGD) WITH PROPOFOL;  Surgeon: Meridee Score Netty Starring., MD;  Location: [facility];  Service: Gastroenterology;  Laterality: N/A;   EXTERNAL EAR SURGERY     POLYPECTOMY  08/12/2021   Procedure: POLYPECTOMY;  Surgeon: Mansouraty, Netty Starring., MD;  Location: [facility];  Service: Gastroenterology;;   WISDOM TOOTH EXTRACTION     Patient Active Problem List   Diagnosis Date Noted   Irritable bowel syndrome with both constipation and diarrhea 02/01/2022   Low back strain 11/18/2021   Anxiety and depression 04/02/2020   Healthcare maintenance 03/02/2020   Class 3 severe obesity with body mass index (BMI) of 60.0 to 69.9 in adult 03/02/2020   Allergic rhinitis 03/02/2020    REFERRING DIAG: S39.012A (ICD-10-CM) - Strain of lumbar region, initial encounter   THERAPY DIAG: Lumbar strain Low back pain chronic  Rationale for Evaluation and Treatment Rehabilitation  PERTINENT HISTORY: None  PRECAUTIONS: None  SUBJECTIVE:  SUBJECTIVE STATEMENT:  Low back began hurting a few hours ago w/o any LE radiculopathy.  Deadlifts were difficult last session  PAIN:  Are you having pain?  Yes: NPRS scale: 0/10 Pain location: low and mid back  Pain description: ache, cramp Aggravating factors: prolonged standing, bending, lifting Relieving factors: lying down    OBJECTIVE: (objective measures completed at initial evaluation unless otherwise dated)  DIAGNOSTIC FINDINGS:  none   PATIENT SURVEYS:  Modified Oswestry 18/50 36% perceived disability    SCREENING FOR RED FLAGS: negative   MUSCLE LENGTH: Hamstrings: Right 90 deg; Left 90 deg   POSTURE: rounded shoulders, forward  head, decreased lumbar lordosis, and increased thoracic kyphosis   PALPATION: Deferred due to body habitus   LUMBAR ROM:    AROM eval  Flexion 90%  Extension 90%  Right lateral flexion 90%P!  Left lateral flexion 90%  Right rotation 90%  Left rotation 90%P!   (Blank rows = not tested)   LOWER EXTREMITY ROM:   WFL limited by body habitus   Active  Right eval Left eval  Hip flexion      Hip extension      Hip abduction      Hip adduction      Hip internal rotation      Hip external rotation      Knee flexion      Knee extension      Ankle dorsiflexion      Ankle plantarflexion      Ankle inversion      Ankle eversion       (Blank rows = not tested)   LOWER EXTREMITY MMT:     MMT Right eval Left eval  Hip flexion 4 4  Hip extension 4 4  Hip abduction 4 4  Hip adduction      Hip internal rotation      Hip external rotation      Knee flexion      Knee extension 4 4  Ankle dorsiflexion      Ankle plantarflexion 4 4  Ankle inversion      Ankle eversion       (Blank rows = not tested)   LUMBAR SPECIAL TESTS:  Straight leg raise test: Negative, Slump test: Negative, and FABER test: deferred due to body habitus   FUNCTIONAL TESTS:  5 times sit to stand: 17s w/o UE Support   GAIT: Distance walked: 20ft x2 Assistive device utilized: None Level of assistance: Complete Independence   TODAY'S TREATMENT: [facility] Adult PT Treatment:                                                DATE: 01/15/23 Therapeutic Exercise: TM 8 min 1.1 Standing hip hikes at counter 10/10 Seated hamstring stretch 30s x2 Bil PPT 3s hold 10x Supine march 12/12 w/PPT 90/90 30s x 2  Bridge x15 Open book 10/10 STS with OH reach 10x using physioball KB squat lifts from stool 10#   [facility] Adult PT Treatment:                                                DATE: 01/14/23 Therapeutic Exercise: TM 8 min 1.0 Seated hamstring stretch 30s x2 Bil  PPT 3s hold 10x Supine march 12/12 w/PPT 90/90 30s  x 2  Bridge x15 STS with OH reach 10x KB DL 1O10 96# - footstool platform performed with and without weight as a single rep, advanced to floor lift on second set  [facility] Adult PT Treatment:                                                DATE: 01/08/23 Therapeutic Exercise: NuStep lvl 5 x 5 min while taking subjective  Seated sciatic stretch 10x ea Supine march 10/10 w/PPT 90/90 30s x 2  Bridge 2x15 KB DL 0A54 09# - 8in platform Standing chop 2x10 10# Standing hip abd 2x10 37.5#  [facility] Adult PT Treatment:                                                DATE: 01/06/23 Therapeutic Exercise: Treadmill 8 min 0.8-1.0 MPH Seated sciatic stretch 10x B QL stretch 30s x2 B Supine march 15/15 w/PPT 90/90 30s x3 (holding 1/3 trials) PPT 3s 10x Open book 10/10 Supine hip flexor stretch with bolster 60s x2 Bil   [facility] Adult PT Treatment:                                                DATE: 12/24/22 Therapeutic Exercise: Nustep L2 6 min Seated sciatic stretch 10x B LTR(unable to hold QL stretch) 30s x2 B Supine march 15/15 90/90 30s hold only completing 1/3 trials holding 30s Seated hamstring stretch 30s x2 Bil Open book 10/10 Supine SLR 15/15  [facility] Adult PT Treatment:                                                DATE: 12/22/2022 Therapeutic Exercise: SLR x 10 each Bridge 2x10 STS 2x10 Pallof press 2x10 10# Standing hip abd/ext 2x10 30# KB deadlift to platform x 10 - 10#   PATIENT EDUCATION:  Education details: Discussed eval findings, rehab rationale and POC and patient is in agreement  Person educated: Patient Education method: Explanation Education comprehension: verbalized understanding and needs further education   HOME EXERCISE PROGRAM: Access Code: [email or web address] URL: [email or web address] Date: 01/14/2023 Prepared by: Gustavus Bryant  Exercises - Supine Bridge  - 2 x daily - 5 x weekly - 2 sets - 10 reps - Supine 90/90 Abdominal Bracing  - 2 x daily - 5 x weekly -  1 sets - 2 reps - 30s hold - Sit to Stand Without Arm Support  - 2 x daily - 5 x weekly - 1 sets - 10 reps - Seated Table Hamstring Stretch  - 2 x daily - 5 x weekly - 1 sets - 2 reps - 30s hold   ASSESSMENT:   CLINICAL IMPRESSION: Mild increased soreness after last session confined to lumbosacral region R>L. Added hip hikes to assess pain but no changes reported.  Modified lifting to allow patient to flex knees which lessened symptoms.   OBJECTIVE IMPAIRMENTS:  decreased activity tolerance, decreased knowledge of condition, decreased mobility, decreased strength, impaired perceived functional ability, obesity, and pain.    ACTIVITY LIMITATIONS: carrying, lifting, bending, and standing   PERSONAL FACTORS: Age, Past/current experiences, Time since onset of injury/illness/exacerbation, and previous PT for same condition  are also affecting patient's functional outcome.    GOALS: Goals reviewed with patient? No   SHORT TERM GOALS: Target date: 12/25/2022   Patient to demonstrate independence in HEP  Baseline: [id number] Goal status: INITIAL   2.  Patient to hold 90/90 position for 30s to demo increased core strength  Baseline: 12s Goal status: INITIAL       LONG TERM GOALS: Target date: 01/15/2023   Decrease ODI score to 14/50 to improve disability from moderate to minimal Baseline: 18/50 Goal status: INITIAL   2.  Increase LE strength to 4+/5 throughout Baseline:  MMT Right eval Left eval  Hip flexion 4 4  Hip extension 4 4  Hip abduction 4 4  Hip adduction      Hip internal rotation      Hip external rotation      Knee flexion      Knee extension 4 4  Ankle dorsiflexion      Ankle plantarflexion 4 4  Ankle inversion      Ankle eversion        Goal status: INITIAL   3.  Decrease 5x STS score to 15s arms crossed Baseline: 17s arms crossed Goal status: INITIAL   4.  Decrease worst pain to 6/10 Baseline: 9/10 Goal status: INITIAL       PLAN:   PT FREQUENCY:  2x/week   PT DURATION: 6 weeks   PLANNED INTERVENTIONS: Therapeutic exercises, Therapeutic activity, Neuromuscular re-education, Balance training, Gait training, Patient/Family education, Self Care, Joint mobilization, Dry Needling, Manual therapy, and Re-evaluation.   PLAN FOR NEXT SESSION: HEP review and update, flexibility and ROM, core strengthening, postural training, aerobic work   Hildred Laser, PT 01/15/2023, 3:58 PM

## 2023-01-28 ENCOUNTER — Telehealth: Payer: Self-pay | Admitting: *Deleted

## 2023-01-28 ENCOUNTER — Encounter: Payer: Medicaid Other | Admitting: Student

## 2023-01-28 NOTE — Telephone Encounter (Signed)
Call from patient's grandmother then spoke with patient -was bitten by dog 3 days ago.  Finger is red and swollen no drainage. Patient was asked if the dog has been vaccinated -grandmother said awhile back.  Is basically and inside dog that only goes out to void.   Patient was offered an appointment for today at 2:45 PM.  Refused stating did not have a ride.  Stated would rather come on tomorrow.  Given an appointment for tomorrow at 1:15 PM.  Advised that if pain worsens to go to an Urgent care.

## 2023-01-29 ENCOUNTER — Ambulatory Visit (HOSPITAL_COMMUNITY)
Admission: RE | Admit: 2023-01-29 | Discharge: 2023-01-29 | Disposition: A | Discharge status: Home / Self Care | Payer: Medicaid Other | Source: Ambulatory Visit | Attending: Internal Medicine | Admitting: Internal Medicine

## 2023-01-29 ENCOUNTER — Encounter (HOSPITAL_COMMUNITY): Payer: Self-pay

## 2023-01-29 ENCOUNTER — Ambulatory Visit (INDEPENDENT_AMBULATORY_CARE_PROVIDER_SITE_OTHER): Payer: Medicaid Other | Admitting: Student

## 2023-01-29 ENCOUNTER — Inpatient Hospital Stay (HOSPITAL_COMMUNITY)
Admission: RE | Admit: 2023-01-29 | Discharge: 2023-01-31 | DRG: 558 | Disposition: A | Discharge status: Home / Self Care | Payer: Medicaid Other | Source: Ambulatory Visit | Attending: Student in an Organized Health Care Education/Training Program | Admitting: Student in an Organized Health Care Education/Training Program

## 2023-01-29 VITALS — BP 129/83 | HR 54 | Temp 97.6°F | Ht 68.5 in | Wt >= 6400 oz

## 2023-01-29 DIAGNOSIS — Z23 Encounter for immunization: Secondary | ICD-10-CM | POA: Diagnosis not present

## 2023-01-29 DIAGNOSIS — F419 Anxiety disorder, unspecified: Secondary | ICD-10-CM

## 2023-01-29 DIAGNOSIS — F32A Depression, unspecified: Secondary | ICD-10-CM | POA: Diagnosis present

## 2023-01-29 DIAGNOSIS — R609 Edema, unspecified: Secondary | ICD-10-CM | POA: Insufficient documentation

## 2023-01-29 DIAGNOSIS — S61257A Open bite of left little finger without damage to nail, initial encounter: Secondary | ICD-10-CM | POA: Diagnosis present

## 2023-01-29 DIAGNOSIS — F339 Major depressive disorder, recurrent, unspecified: Secondary | ICD-10-CM

## 2023-01-29 DIAGNOSIS — M659 Synovitis and tenosynovitis, unspecified: Secondary | ICD-10-CM | POA: Insufficient documentation

## 2023-01-29 DIAGNOSIS — D696 Thrombocytopenia, unspecified: Secondary | ICD-10-CM | POA: Diagnosis present

## 2023-01-29 DIAGNOSIS — K580 Irritable bowel syndrome with diarrhea: Secondary | ICD-10-CM

## 2023-01-29 DIAGNOSIS — Y939 Activity, unspecified: Secondary | ICD-10-CM | POA: Insufficient documentation

## 2023-01-29 DIAGNOSIS — Z8249 Family history of ischemic heart disease and other diseases of the circulatory system: Secondary | ICD-10-CM

## 2023-01-29 DIAGNOSIS — W540XXA Bitten by dog, initial encounter: Secondary | ICD-10-CM

## 2023-01-29 DIAGNOSIS — F909 Attention-deficit hyperactivity disorder, unspecified type: Secondary | ICD-10-CM | POA: Diagnosis present

## 2023-01-29 DIAGNOSIS — E669 Obesity, unspecified: Secondary | ICD-10-CM | POA: Diagnosis present

## 2023-01-29 DIAGNOSIS — Z83438 Family history of other disorder of lipoprotein metabolism and other lipidemia: Secondary | ICD-10-CM

## 2023-01-29 DIAGNOSIS — S60222A Contusion of left hand, initial encounter: Secondary | ICD-10-CM | POA: Insufficient documentation

## 2023-01-29 DIAGNOSIS — J45909 Unspecified asthma, uncomplicated: Secondary | ICD-10-CM | POA: Diagnosis present

## 2023-01-29 DIAGNOSIS — M65142 Other infective (teno)synovitis, left hand: Principal | ICD-10-CM | POA: Diagnosis present

## 2023-01-29 DIAGNOSIS — Y929 Unspecified place or not applicable: Secondary | ICD-10-CM | POA: Insufficient documentation

## 2023-01-29 DIAGNOSIS — L089 Local infection of the skin and subcutaneous tissue, unspecified: Secondary | ICD-10-CM | POA: Diagnosis present

## 2023-01-29 DIAGNOSIS — S61341D Puncture wound with foreign body of left index finger with damage to nail, subsequent encounter: Secondary | ICD-10-CM

## 2023-01-29 DIAGNOSIS — Z79899 Other long term (current) drug therapy: Secondary | ICD-10-CM

## 2023-01-29 DIAGNOSIS — F411 Generalized anxiety disorder: Secondary | ICD-10-CM | POA: Diagnosis present

## 2023-01-29 DIAGNOSIS — Z833 Family history of diabetes mellitus: Secondary | ICD-10-CM

## 2023-01-29 DIAGNOSIS — F17210 Nicotine dependence, cigarettes, uncomplicated: Secondary | ICD-10-CM | POA: Diagnosis present

## 2023-01-29 DIAGNOSIS — M65949 Unspecified synovitis and tenosynovitis, unspecified hand: Secondary | ICD-10-CM | POA: Diagnosis present

## 2023-01-29 LAB — COMPREHENSIVE METABOLIC PANEL
ALT: 18 U/L (ref 0–44)
AST: 14 U/L — ABNORMAL LOW (ref 15–41)
Albumin: 3.7 g/dL (ref 3.5–5.0)
Alkaline Phosphatase: 84 U/L (ref 38–126)
Anion gap: 6 (ref 5–15)
BUN: 8 mg/dL (ref 6–20)
CO2: 27 mmol/L (ref 22–32)
Calcium: 9.2 mg/dL (ref 8.9–10.3)
Chloride: 107 mmol/L (ref 98–111)
Creatinine, Ser: 1.05 mg/dL — ABNORMAL HIGH (ref 0.44–1.00)
GFR, Estimated: >60 mL/min (ref >60)
Glucose, Bld: 87 mg/dL (ref 70–99)
Potassium: 3.8 mmol/L (ref 3.5–5.1)
Sodium: 140 mmol/L (ref 135–145)
Total Bilirubin: 0.8 mg/dL (ref 0.3–1.2)
Total Protein: 7.1 g/dL (ref 6.5–8.1)

## 2023-01-29 LAB — SEDIMENTATION RATE: Sed Rate: 27 mm/hr — ABNORMAL HIGH (ref 0–22)

## 2023-01-29 LAB — CBC
HCT: 41.6 % (ref 36.0–46.0)
Hemoglobin: 14.2 g/dL (ref 12.0–15.0)
MCH: 30.9 pg (ref 26.0–34.0)
MCHC: 34.1 g/dL (ref 30.0–36.0)
MCV: 90.4 fL (ref 80.0–100.0)
Platelets: 240 10*3/uL (ref 150–400)
RBC: 4.6 MIL/uL (ref 3.87–5.11)
RDW: 12.7 % (ref 11.5–15.5)
WBC: 8.4 10*3/uL (ref 4.0–10.5)
nRBC: 0 % (ref 0.0–0.2)

## 2023-01-29 LAB — HIV ANTIBODY (ROUTINE TESTING W REFLEX): HIV Screen 4th Generation wRfx: NONREACTIVE

## 2023-01-29 LAB — C-REACTIVE PROTEIN: CRP: 0.6 mg/dL (ref <1.0)

## 2023-01-29 MED ORDER — TETANUS-DIPHTHERIA TOXOIDS TD 5-2 LFU IM INJ: 0.5000 mL | INJECTION | Freq: Once | INTRAMUSCULAR | Status: DC

## 2023-01-29 MED ORDER — SODIUM CHLORIDE 0.9 % IV SOLN
3.0000 g | Freq: Four times a day (QID) | INTRAVENOUS | Status: DC
  Administered 2023-01-29 – 2023-01-31 (×7): 3 g via INTRAVENOUS
  Filled 2023-01-29 (×6): qty 8

## 2023-01-29 MED ORDER — ACETAMINOPHEN 650 MG RE SUPP: 650.0000 mg | Freq: Three times a day (TID) | RECTAL | Status: DC | PRN

## 2023-01-29 MED ORDER — AMOXICILLIN-POT CLAVULANATE 875-125 MG PO TABS: 1.0000 | ORAL_TABLET | Freq: Two times a day (BID) | ORAL | 0 refills | Status: DC

## 2023-01-29 MED ORDER — ENOXAPARIN SODIUM 100 MG/ML IJ SOSY
90.0000 mg | PREFILLED_SYRINGE | INTRAMUSCULAR | Status: DC
  Administered 2023-01-29 – 2023-01-30 (×2): 90 mg via SUBCUTANEOUS
  Filled 2023-01-29 (×2): qty 1

## 2023-01-29 MED ORDER — ACETAMINOPHEN 500 MG PO TABS
1000.0000 mg | ORAL_TABLET | Freq: Three times a day (TID) | ORAL | Status: DC | PRN
  Administered 2023-01-30 – 2023-01-31 (×2): 1000 mg via ORAL
  Filled 2023-01-29 (×2): qty 2

## 2023-01-29 MED ORDER — DULOXETINE HCL 30 MG PO CPEP
30.0000 mg | ORAL_CAPSULE | Freq: Every day | ORAL | Status: DC
  Administered 2023-01-29 – 2023-01-31 (×3): 30 mg via ORAL
  Filled 2023-01-29 (×3): qty 1

## 2023-01-29 MED ORDER — TETANUS-DIPHTH-ACELL PERTUSSIS 5-2.5-18.5 LF-MCG/0.5 IM SUSY
0.5000 mL | PREFILLED_SYRINGE | Freq: Once | INTRAMUSCULAR | Status: AC
  Administered 2023-01-31: 0.5 mL via INTRAMUSCULAR
  Filled 2023-01-29 (×2): qty 0.5

## 2023-01-29 NOTE — Consult Note (Signed)
Orthopaedic Surgery Hand and Upper Extremity History and Physical Examination 01/29/2023  Referring Provider: Earl Lagos, MD [address] [place],  [place] [address]  CC: Dog bite to left hand  HPI: Kathryn Andrews is a 27 y.o. female who sustained a dog bite 3 days ago on the ulnar aspect of the small finger adjacent to the PIP joint.  Her small finger proximal phalanx has become swollen over the past few days so she presented to the ED today. MRI was obtained and there was reportedly concern for flexor tenosynovitis on MRI.  She was subsequently transferred to [facility].  She reports her pain has been improving over the past several hours.   Past Medical History: Past Medical History:  Diagnosis Date   Abdominal pain, epigastric 03/08/2021   Anxiety    Asthma    Attention deficit hyperactivity disorder    Bloating 03/08/2021   Chest pain    Chronic constipation    Chronic diarrhea 03/08/2021   Chronic lower back pain 04/02/2020   Class 3 severe obesity without serious comorbidity with body mass index (BMI) of 60.0 to 69.9 in adult [facility]) 12/15/2021   Depression    Functional dyspepsia 02/28/2020   Generalized abdominal pain 12/15/2021   Irritable bowel syndrome with diarrhea 12/15/2021   Morbid obesity (HCC) 03/02/2020   Urinary frequency 04/02/2020     Medications: Scheduled Meds:  DULoxetine  30 mg Oral Daily   enoxaparin (LOVENOX) injection  90 mg Subcutaneous Q24H   Tdap  0.5 mL Intramuscular Once   Continuous Infusions:  ampicillin-sulbactam (UNASYN) IV     PRN Meds:.acetaminophen **OR** acetaminophen  Allergies: Allergies as of 01/29/2023   (No Known Allergies)    Past Surgical History: Past Surgical History:  Procedure Laterality Date   adenoid removal     BIOPSY  08/12/2021   Procedure: BIOPSY;  Surgeon: Lemar Lofty., MD;  Location: [facility];  Service: Gastroenterology;;  EGD and COLON   COLONOSCOPY WITH PROPOFOL N/A 08/12/2021    Procedure: COLONOSCOPY WITH PROPOFOL;  Surgeon: Lemar Lofty., MD;  Location: [facility];  Service: Gastroenterology;  Laterality: N/A;   ESOPHAGOGASTRODUODENOSCOPY (EGD) WITH PROPOFOL N/A 08/12/2021   Procedure: ESOPHAGOGASTRODUODENOSCOPY (EGD) WITH PROPOFOL;  Surgeon: Meridee Score Netty Starring., MD;  Location: [facility];  Service: Gastroenterology;  Laterality: N/A;   EXTERNAL EAR SURGERY     POLYPECTOMY  08/12/2021   Procedure: POLYPECTOMY;  Surgeon: Mansouraty, Netty Starring., MD;  Location: [facility];  Service: Gastroenterology;;   WISDOM TOOTH EXTRACTION       Social History: Social History   Occupational History   Not on file  Tobacco Use   Smoking status: Every Day    Packs/day: .25    Types: Cigarettes   Smokeless tobacco: Never   Tobacco comments:    Vapes   Vaping Use   Vaping Use: Never used  Substance and Sexual Activity   Alcohol use: Yes    Comment: occasionally   Drug use: Yes    Types: Marijuana   Sexual activity: Not on file     Family History: Family History  Problem Relation Age of Onset   Diabetes Paternal Grandmother    CAD Paternal Grandmother    Hypertension Paternal Grandmother    Hyperlipidemia Paternal Grandmother    Colon cancer Neg Hx    Esophageal cancer Neg Hx    Inflammatory bowel disease Neg Hx    Liver disease Neg Hx    Pancreatic cancer Neg Hx  Rectal cancer Neg Hx    Stomach cancer Neg Hx    Otherwise, no relevant orthopaedic family history  ROS: Review of Systems: All systems reviewed and are negative except that mentioned in HPI  Work/Sport/Hobbies: See HPI  Physical Examination: There were no vitals filed for this visit. Constitutional: Awake, alert.  WN/WD Appearance: healthy, no acute distress, well-groomed Affect: Normal HEENT: EOMI, mucous membranes moist CV: RRR Pulm: breathing comfortably   Left Upper Extremity / Hand There is a healing wound on the ulnar aspect of the left small finger adjacent  to the PIP joint.  Mild swelling along the proximal phalanx.  No drainage.  No obvious erythema.  Minimally tender to palpation along the small finger.  No fluctuance noted.  Able to range the small finger MCP (0-90), PIP (0-80), and DIP (0-70) joints with minimal pain.  Nontender along flexor tendon sheath in palm. No fusiform swelling of the digit.  No significant pain with extension or active flexion of digit.  Sensation: intact to light touch on radial and ulnar aspects of small finger and median, radial, and ulnar distributions Motor: intact in AIN, PIN, and ulnar distributions Vascular: warm and well perfused with brisk cap refill.   Pertinent Labs: n/a  Imaging: MRI reports flexor tenosynovitis but on my review, I only see a small focal area of fluid volar to the tendon and do not see fluid tracking along the 5th tendon sheath.  Additional Studies: n/a  Assessment/Plan: Left small finger infection due to dog bite  Her presentation does not appear to be flexor tenosynovitis or if it is, is so early that it can likely be treated with IV antibiotics alone.  Recommend broad spectrum antibiotics that cover animal bites.  Please contact me should her exam worsen.  If her symptoms continue to improve, I recommend antibiotics per hospitalist but at least 2 weeks of coverage.  If she is discharged, she will need follow up with me next week.  Shaune Pollack, MD Hand and Upper Extremity Surgery [facility] [phone] 01/29/2023 11:08 PM

## 2023-01-29 NOTE — H&P (Addendum)
Date: 01/29/2023               Patient Name:  Kathryn Andrews MRN: [id number]  DOB: 1995/12/19 Age / Sex: 27 y.o., female   PCP: Gwenevere Abbot, MD         Medical Service: Internal Medicine Teaching Service         Attending Physician: Dr. Oswaldo Done, Marquita Palms, *      First Contact: Dr. Morene Crocker, MD Pager [phone]    Second Contact: Dr. Elza Rafter, DO Pager [phone]         After Hours (After 5p/  First Contact Pager: [phone]  weekends / holidays): Second Contact Pager: [phone]   SUBJECTIVE   Chief Complaint: Swelling of left 5th digit after dog bite  History of Present Illness:  Kathryn Andrews is a 27 y/o female with PMH of IBS-D, GAD, and obesity who presents as a direct admission for flexor tenosynovitis of her left 5th finger after a penetrating dog bite 3 days ago. Dog is domesticated, vaccinated as a puppy but not in the last several years, goes inside and outside the home, and had no behavior changes recently. She was picking her dog up off the bed when it bit her on the hand.  Over the past 3 days her left pinky between the MCP and PIP has been swollen and tender without significant progression or acute worsening.  She has pain to palpation and when flexing her pinky without the ability to completely flex her left pinky into a closed fist.  There is some numbness in the area of swelling but otherwise no numbness in her hand or paresthesias/pain at the tip of her fingers.  No issues moving her wrist or her other fingers.  She has taken some ibuprofen for the pain which has helped a little bit.  Denies any other symptoms including fever, chills, fatigue, abdominal pain, shortness of breath, nausea, vomiting, diarrhea, constipation, palpitations.  Past Medical History IBS D Anxiety  Meds:  Currently does not take any meds but has been prescribed the medications below Duloxetine 30 mg daily Hydroxyzine 10 mg 3 times daily as needed Albuterol inhaler  Past  Surgical History Past Surgical History:  Procedure Laterality Date   adenoid removal     BIOPSY  08/12/2021   Procedure: BIOPSY;  Surgeon: Lemar Lofty., MD;  Location: [facility];  Service: Gastroenterology;;  EGD and COLON   COLONOSCOPY WITH PROPOFOL N/A 08/12/2021   Procedure: COLONOSCOPY WITH PROPOFOL;  Surgeon: Lemar Lofty., MD;  Location: [facility];  Service: Gastroenterology;  Laterality: N/A;   ESOPHAGOGASTRODUODENOSCOPY (EGD) WITH PROPOFOL N/A 08/12/2021   Procedure: ESOPHAGOGASTRODUODENOSCOPY (EGD) WITH PROPOFOL;  Surgeon: Meridee Score Netty Starring., MD;  Location: [facility];  Service: Gastroenterology;  Laterality: N/A;   EXTERNAL EAR SURGERY     POLYPECTOMY  08/12/2021   Procedure: POLYPECTOMY;  Surgeon: Mansouraty, Netty Starring., MD;  Location: [facility];  Service: Gastroenterology;;   WISDOM TOOTH EXTRACTION      Social:  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  1/4 pack/day for about 8 years and will use a cartridge per week with her vape for the past 10 years.  Social drinker, roughly once per week.  Denies any drug use.  Family History:  Grandmother and uncle with unknown cancer Grandmother with diabetes  Allergies:  Allergies as of 01/29/2023   (No Known Allergies)    Review of Systems: A complete ROS was negative except as per HPI.   OBJECTIVE:   Physical Exam: There were no vitals taken for this visit.  Constitutional: Obese young female sitting up in bed. In no acute distress. HENT: Normocephalic, atraumatic,  Eyes: Sclera non-icteric, EOM intact Cardio:Regular rate and rhythm. 2+ bilateral radial pulses. Pulm:Clear to auscultation bilaterally. Normal work of breathing on room air. Abdomen: Soft, non-tender, non-distended, positive bowel  sounds. MSK: Edema, erythema, and tenderness on the left fifth finger between the MCP and PIP, more significant on the palmar surface without extension into the palm, fourth finger, or distally past the PIP.  No tenderness to carpal squeeze.  Full active and passive range of motion of the left wrist.  Full passive range of motion of the left fifth finger with pain. Neuro:Alert and oriented x3. No focal deficit noted.  Labs: CBC    Component Value Date/Time   WBC 8.4 01/29/2023 2045   RBC 4.60 01/29/2023 2045   HGB 14.2 01/29/2023 2045   HGB 14.9 02/28/2020 1602   HCT 41.6 01/29/2023 2045   HCT 44.3 02/28/2020 1602   PLT 240 01/29/2023 2045   PLT 292 02/28/2020 1602   MCV 90.4 01/29/2023 2045   MCV 90 02/28/2020 1602   MCH 30.9 01/29/2023 2045   MCHC 34.1 01/29/2023 2045   RDW 12.7 01/29/2023 2045   RDW 12.7 02/28/2020 1602   LYMPHSABS 2.0 03/06/2021 1223   MONOABS 0.5 03/06/2021 1223   EOSABS 0.3 03/06/2021 1223   BASOSABS 0.0 03/06/2021 1223     CMP     Component Value Date/Time   NA 139 03/06/2021 1223   NA 142 01/17/2021 1621   K 3.9 03/06/2021 1223   CL 107 03/06/2021 1223   CO2 23 03/06/2021 1223   GLUCOSE 88 03/06/2021 1223   BUN 15 03/06/2021 1223   BUN 11 01/17/2021 1621   CREATININE 0.82 03/06/2021 1223   CALCIUM 9.5 03/06/2021 1223   PROT 7.6 03/06/2021 1223   PROT 7.3 02/28/2020 1602   ALBUMIN 4.4 03/06/2021 1223   ALBUMIN 4.0 02/28/2020 1602   AST 12 03/06/2021 1223   ALT 10 03/06/2021 1223   ALKPHOS 100 03/06/2021 1223   BILITOT 0.5 03/06/2021 1223   BILITOT 0.5 02/28/2020 1602   GFRNONAA >60 10/11/2020 1521   GFRAA 110 02/28/2020 1602    Imaging: MR HAND LEFT WO CONTRAST Result Date: 01/29/2023 IMPRESSION: 1. Multiple areas of inflammations/edema/contusion involving the palmar and dorsal aspects of the fifth finger. No discrete abscess is identified without contrast. No obvious foreign body. 2. Tenosynovitis involving the flexor tendon of the  fifth finger. No evidence of tendon rupture. 3. No findings suspicious for septic arthritis or osteomyelitis. 4. No MR findings to suggest myofasciitis or pyomyositis.  Electronically Signed   By: Rudie Meyer M.D.   On: 01/29/2023 16:12      ASSESSMENT & PLAN:   Assessment & Plan by Problem: Principal Problem:   Tenosynovitis of finger Active Problems:   Anxiety and depression   Dog bite   Kathryn Andrews is a 27 y.o. female with pertinent PMH of IBS-D, GAD, and obesity who presented with 3 days of pain and swelling in her left pinky after being bit by dog and is admitted for IV antibiotics for suspected tenosynovitis of her left fifth finger.  Tenosynovitis of left fifth finger after dog bite No signs of systemic infection or  rapidly progressive local infection at this time.  Discussed the case with Dr. Kerry Fort (Ortho-Hand) who after reviewing the chart and going to evaluate the patient thinks that this is not tenosynovitis or is very early tenosynovitis without the need for surgery. Agrees with initiation of IV antibiotics with the addition of checking inflammatory markers. Unknown last tetanus booster.  We do not have any lab work at the moment but she is afebrile and does not have signs of systemic infection so we do not think blood cultures are necessary.  If she has significant improvement on IV antibiotics we may be able to transition to Augmentin and discharge in the next 1 to 2 days. - Unasyn per pharmacy - Appreciate Dr. Virgina Jock evaluation and recommendations - Tdap booster - CBC, CMP, ESR, CRP - Tylenol 1000 mg 3 times daily as needed  Anxiety Patient has not taken her duloxetine for the past 2 months.  States that she is forgetful but did not have any significant side effects.  She would like to restart this so we will do that during admission.  She is also prescribed hydroxyzine 10 mg 3 times daily as needed which we will not resume right now but may be added if  requested. - Duloxetine 30 mg daily  IBS-D Symptoms have been stable recently without any severe abdominal cramps or significant diarrhea or constipation.  She states she saw her gastroenterologist around the new year but her last visit available in the chart is from 03/2022 at which time she was prescribed a course of rifaximin which did make her symptoms better at the time.  Next appointment she states is as needed.  We will monitor for any changes in bowel habits during admission and discuss the common side effect of diarrhea with antibiotics and appropriate management with the patient before she leaves.  Diet: Normal VTE: Enoxaparin IVF: None, Code: Full  Dispo: Admit patient to Observation with expected length of stay less than 2 midnights.  Signed: Rocky Morel, DO Internal Medicine Resident PGY-1  01/29/2023, 9:15 PM   Dr. Morene Crocker, MD Pager [phone]

## 2023-01-29 NOTE — Hospital Course (Addendum)
Small dog bit her on the hand. Small dog, goes in and out, has had some vaccines in the past. Not new that he nips at her  Can't squeeze finger because of the swelling. Other fingers work well. No numbness or tingling up the arm. Only painful on the spot. No pain at the tip of finger.   Hand has mostly been the same since initial injury. Swelling the same. No blood or pus. No fevers, chills, fatigue, palpitations, nvdc, abdominal pain, no dysuria.   Ran out of Cymbalta, forgets sometimes. Would be ok re-starting this. Did have some dry mouth with it but otherwise no side effects. No other recent medication changes.   Lives and takes care of grandmother. Completes ADL's and IADL's.   FH - two family members with cancer, another with diabetes  Cigarettes - mostly vape, but at most was smoking 4 cigs/day, now 3/day, smoked for 8y, vaped for 10y   Not much EtOH  No drugs, IVDU   4/26: Patient reports pain has improved. She feels as though she is able to flex her left pinky better. Patient reports that she has had her small dog for 8-9 years. She was bit when attempting to put the dog in its kennel. She has never been bit by her dog before. Denies chest pain, SOB, or chills.

## 2023-01-29 NOTE — Progress Notes (Addendum)
    Subjective:  Kathryn Andrews is a 27 y.o. who presents to clinic for the following:  Animal Bite  Reports her dog, that she knows well, bit her on the fifth digit of her left hand.  This happened when she was trying to get the dog off of her grandmother's bed.  She had a little bit of bleeding at that time which stopped.  However the finger became swollen and painful.  It has not improved in 3 days.  The dog is unvaccinated and she does not know when her last tetanus shot was.  She has been taking ibuprofen to little effect.  She denies fevers, chills, warmth redness or streaking up her arm.  Objective:   Vitals:   01/29/23 1331  BP: 129/83  Pulse: (!) 54  Temp: 97.6 F (36.4 C)  TempSrc: Oral  SpO2: 100%  Weight: (!) 409 lb 3.2 oz (185.6 kg)  Height: 5' 8.5" (1.74 m)    Physical Exam Well-appearing, no apparent distress Heart rate normal, rhythm regular Breathing is regular and unlabored on room air Skin is warm and dry Left upper extremity fifth digit has evidence of a small puncture wound over the lateral aspect of the proximal interphalangeal joint, there is some swelling, tenderness, erythema over the flexor surface of the finger, she has pain on passive extension of the digit, the redness and swelling do not extend distally She has no redness or streaking proximal to the MCP joint of the left fifth digit Alert and oriented Pleasant, affect concordant    Assessment & Plan:   Dog bite Primary concern is for left fifth digit flexor tenosynovitis versus proximal interphalangeal joint septic arthritis.  May also be overlying uncomplicated cellulitis.  She is not systemically ill.  The overlying skin findings are somewhat subtle and not well-appreciated on the photo, but with the pain, swelling, and redness that have not improved in 3 days along with exam findings, index of suspicion for deep space infection is high enough that I will order stat MRI and urgent referral to hand  surgery.  Will start antibiotics today.  Per clinic referral coordinator, MRI appointment available today around 4-4:30 p.m.  They will call regarding hand surgery referral.  Will follow results closely. - Amoxicillin/clavulanate 875/125 mg twice daily x 7 days  Addendum: MRI positive for tenosynovitis of the left fifth digit.  Will admit for IV antibiotics and hand surgery consultation.    Return 1 month, for routine follow-up, mood, and dog bite.  Patient discussed with Dr. Jan Fireman MD 01/29/2023, 5:09 PM  Pager: [phone]

## 2023-01-29 NOTE — Assessment & Plan Note (Addendum)
Primary concern is for left fifth digit flexor tenosynovitis versus proximal interphalangeal joint septic arthritis.  May also be overlying uncomplicated cellulitis.  She is not systemically ill.  The overlying skin findings are somewhat subtle and not well-appreciated on the photo, but with the pain, swelling, and redness that have not improved in 3 days along with exam findings, index of suspicion for deep space infection is high enough that I will order stat MRI and urgent referral to hand surgery.  Will start antibiotics today.  Per clinic referral coordinator, MRI appointment available today around 4-4:30 p.m.  They will call regarding hand surgery referral.  Will follow results closely. - Amoxicillin/clavulanate 875/125 mg twice daily x 7 days  Addendum: MRI positive for tenosynovitis of the left fifth digit.  Will admit for IV antibiotics and hand surgery consultation.

## 2023-01-29 NOTE — Progress Notes (Signed)
Pharmacy Antibiotic Note  Kathryn Andrews is a 27 y.o. female admitted on 01/29/2023 with  L hand infection after dog bit .  Pharmacy has been consulted for Unasyn dosing.  Plan: Unasyn 3gm IV q6h Will f/u renal function, micro data, and pt's clinical condition     Temp (24hrs), Avg:97.6 F (36.4 C), Min:97.6 F (36.4 C), Max:97.6 F (36.4 C)  Recent Labs  Lab 01/29/23 2045  WBC 8.4    CrCl cannot be calculated (Patient's most recent lab result is older than the maximum 21 days allowed.).    No Known Allergies  Antimicrobials this admission: 4/25 Unasyn >>   Thank you for allowing pharmacy to be a part of this patient's care.  Christoper Fabian, PharmD, BCPS Please see amion for complete clinical pharmacist phone list 01/29/2023 9:13 PM

## 2023-01-29 NOTE — Progress Notes (Signed)
Patient arrived to unit from MD office. No orders received at this time. Attending MDs are aware that patient has arrived and will come up to assess and place order shortly. Oncoming RN aware. Patient denied any distress or concerns.

## 2023-01-29 NOTE — Patient Instructions (Signed)
This after visit summary is an important review of tests, referrals, and medication changes that were discussed during your visit. If you have questions or concerns, call [phone]. Outside of clinic business hours, call the main hospital at [phone] and ask the operator for the on-call internal medicine resident.   Ernesta Amble MD 01/29/2023, 2:18 PM

## 2023-01-30 DIAGNOSIS — F32A Depression, unspecified: Secondary | ICD-10-CM | POA: Diagnosis present

## 2023-01-30 DIAGNOSIS — Z83438 Family history of other disorder of lipoprotein metabolism and other lipidemia: Secondary | ICD-10-CM | POA: Diagnosis not present

## 2023-01-30 DIAGNOSIS — Z23 Encounter for immunization: Secondary | ICD-10-CM | POA: Diagnosis not present

## 2023-01-30 DIAGNOSIS — S61257A Open bite of left little finger without damage to nail, initial encounter: Secondary | ICD-10-CM | POA: Diagnosis present

## 2023-01-30 DIAGNOSIS — L089 Local infection of the skin and subcutaneous tissue, unspecified: Secondary | ICD-10-CM | POA: Diagnosis present

## 2023-01-30 DIAGNOSIS — Z79899 Other long term (current) drug therapy: Secondary | ICD-10-CM | POA: Diagnosis not present

## 2023-01-30 DIAGNOSIS — J45909 Unspecified asthma, uncomplicated: Secondary | ICD-10-CM | POA: Diagnosis present

## 2023-01-30 DIAGNOSIS — W540XXA Bitten by dog, initial encounter: Secondary | ICD-10-CM | POA: Diagnosis not present

## 2023-01-30 DIAGNOSIS — M65142 Other infective (teno)synovitis, left hand: Secondary | ICD-10-CM | POA: Diagnosis present

## 2023-01-30 DIAGNOSIS — F17210 Nicotine dependence, cigarettes, uncomplicated: Secondary | ICD-10-CM | POA: Diagnosis present

## 2023-01-30 DIAGNOSIS — E669 Obesity, unspecified: Secondary | ICD-10-CM | POA: Diagnosis present

## 2023-01-30 DIAGNOSIS — D696 Thrombocytopenia, unspecified: Secondary | ICD-10-CM | POA: Diagnosis present

## 2023-01-30 DIAGNOSIS — Z833 Family history of diabetes mellitus: Secondary | ICD-10-CM | POA: Diagnosis not present

## 2023-01-30 DIAGNOSIS — F909 Attention-deficit hyperactivity disorder, unspecified type: Secondary | ICD-10-CM | POA: Diagnosis present

## 2023-01-30 DIAGNOSIS — F411 Generalized anxiety disorder: Secondary | ICD-10-CM | POA: Diagnosis present

## 2023-01-30 DIAGNOSIS — K580 Irritable bowel syndrome with diarrhea: Secondary | ICD-10-CM | POA: Diagnosis present

## 2023-01-30 DIAGNOSIS — Z8249 Family history of ischemic heart disease and other diseases of the circulatory system: Secondary | ICD-10-CM | POA: Diagnosis not present

## 2023-01-30 DIAGNOSIS — M659 Synovitis and tenosynovitis, unspecified: Secondary | ICD-10-CM | POA: Diagnosis present

## 2023-01-30 LAB — CBC
HCT: 42.8 % (ref 36.0–46.0)
Hemoglobin: 14.2 g/dL (ref 12.0–15.0)
MCH: 30 pg (ref 26.0–34.0)
MCHC: 33.2 g/dL (ref 30.0–36.0)
MCV: 90.3 fL (ref 80.0–100.0)
Platelets: 74 10*3/uL — ABNORMAL LOW (ref 150–400)
RBC: 4.74 MIL/uL (ref 3.87–5.11)
RDW: 12.8 % (ref 11.5–15.5)
WBC: 7.2 10*3/uL (ref 4.0–10.5)
nRBC: 0 % (ref 0.0–0.2)

## 2023-01-30 LAB — BASIC METABOLIC PANEL
Anion gap: 7 (ref 5–15)
BUN: 7 mg/dL (ref 6–20)
CO2: 21 mmol/L — ABNORMAL LOW (ref 22–32)
Calcium: 8.5 mg/dL — ABNORMAL LOW (ref 8.9–10.3)
Chloride: 110 mmol/L (ref 98–111)
Creatinine, Ser: 0.9 mg/dL (ref 0.44–1.00)
GFR, Estimated: >60 mL/min (ref >60)
Glucose, Bld: 85 mg/dL (ref 70–99)
Potassium: 3.8 mmol/L (ref 3.5–5.1)
Sodium: 138 mmol/L (ref 135–145)

## 2023-01-30 LAB — GLUCOSE, CAPILLARY: Glucose-Capillary: 98 mg/dL (ref 70–99)

## 2023-01-30 LAB — HEMOGLOBIN A1C
Hgb A1c MFr Bld: 4.9 % (ref 4.8–5.6)
Mean Plasma Glucose: 93.93 mg/dL

## 2023-01-30 NOTE — TOC Initial Note (Signed)
Transition of Care [facility]) - Initial/Assessment Note    Patient Details  Name: Kathryn Andrews MRN: [id number] Date of Birth: 1995/11/08  Transition of Care [facility]) CM/SW Contact:    Durenda Guthrie, RN Phone Number: 01/30/2023, 1:26 PM  Clinical Narrative:                  Transition of Care [facility]) Department has reviewed patient and no TOC needs have been identified at this time. We will continue to monitor patient advancement through Interdisciplinary progressions and if new patient needs arise, please place a consult.  Expected Discharge Plan: Home/Self Care Barriers to Discharge: Continued Medical Work up   Patient Goals and CMS Choice            Expected Discharge Plan and Services                                              Prior Living Arrangements/Services                       Activities of Daily Living      Permission Sought/Granted                  Emotional Assessment              Admission diagnosis:  Tenosynovitis of finger [M65.9] Patient Active Problem List   Diagnosis Date Noted   Dog bite of left hand including fingers with infection 01/30/2023   Dog bite 01/29/2023   Tenosynovitis of finger 01/29/2023   Irritable bowel syndrome with both constipation and diarrhea 02/01/2022   Low back strain 11/18/2021   Anxiety and depression 04/02/2020   Healthcare maintenance 03/02/2020   Class 3 severe obesity with body mass index (BMI) of 60.0 to 69.9 in adult (HCC) 03/02/2020   Allergic rhinitis 03/02/2020   PCP:  Gwenevere Abbot, MD Pharmacy:   [id number] - [place], [place] - [address] [place] [address] Phone: [phone] Fax: [phone]     Social Determinants of Health (SDOH) Social History: SDOH Screenings   Food Insecurity: No Food Insecurity (10/16/2022)  Housing: Low Risk  (10/16/2022)  Transportation Needs: No Transportation Needs  (10/16/2022)  Utilities: Not At Risk (10/16/2022)  Depression (PHQ2-9): High Risk (10/16/2022)  Tobacco Use: High Risk (01/15/2023)   SDOH Interventions:     Readmission Risk Interventions     No data to display

## 2023-01-30 NOTE — Progress Notes (Signed)
Hospital day#0 Subjective:   Summary: Kathryn Andrews is a 27 year old person admitted to the hospital after closed dog bite to the 5th L digit 4 days ago with soft tissue infection, now on IV antibiotics  Overnight Events: Pain controlled with Tylenol and improvement in finger mobility.  Patient reports pain has improved. She feels as though she is able to flex her left pinky better. Patient reports that she has had her small dog for 8-9 years. She was bit when attempting to put the dog in its kennel. She has never been bit by her dog before. Denies chest pain, SOB, or chills.   Objective:  Vital signs in last 24 hours: Vitals:   01/29/23 2355 01/30/23 0434 01/30/23 0811  BP: 125/65 128/67 133/79  Pulse: (!) 57 60 (!) 58  Resp: 18 18 18   Temp: 98 F (36.7 C) 97.9 F (36.6 C) 97.9 F (36.6 C)  TempSrc:  Oral Oral  SpO2: 99% 95% 97%   Supplemental O2: Room Air SpO2: 97 % There were no vitals filed for this visit.  Physical Exam:  Constitutional: NAD HENT: supple, moist MM Neck: supple Cardiovascular: regular rate and rhythm, no m/r/g Pulmonary/Chest: normal work of breathing on room air, lungs clear to auscultation bilaterally. No wheezing Abdominal: Normal BS, soft, non-tender, non-distended.  MSK: normal bulk and tone. No pitting edema L 5th digit with mild erythema, edema, and tenderness confined to the palmar surface around MCP joint. No visible erythema, fluctuance, or tenderness in surrounding structures. Full ROM. Neurological: alert & oriented x 3 Skin: warm and dry Psych: Pleasant mood and affect    Intake/Output Summary (Last 24 hours) at 01/30/2023 1135 Last data filed at 01/30/2023 0600 Gross per 24 hour  Intake 200 ml  Output --  Net 200 ml   Net IO Since Admission: 200 mL [01/30/23 1135]  Pertinent Labs:    Latest Ref Rng & Units 01/30/2023    4:50 AM 01/29/2023    8:45 PM 03/06/2021   12:23 PM  CBC  WBC 4.0 - 10.5 K/uL 7.2  8.4  7.9   Hemoglobin  12.0 - 15.0 g/dL 16.1  09.6  04.5   Hematocrit 36.0 - 46.0 % 42.8  41.6  41.5   Platelets 150 - 400 K/uL 74  240  278.0        Latest Ref Rng & Units 01/30/2023    4:50 AM 01/29/2023    8:45 PM 03/06/2021   12:23 PM  CMP  Glucose 70 - 99 mg/dL 85  87  88   BUN 6 - 20 mg/dL 7  8  15    Creatinine 0.44 - 1.00 mg/dL 4.09  8.11  9.14   Sodium 135 - 145 mmol/L 138  140  139   Potassium 3.5 - 5.1 mmol/L 3.8  3.8  3.9   Chloride 98 - 111 mmol/L 110  107  107   CO2 22 - 32 mmol/L 21  27  23    Calcium 8.9 - 10.3 mg/dL 8.5  9.2  9.5   Total Protein 6.5 - 8.1 g/dL  7.1  7.6   Total Bilirubin 0.3 - 1.2 mg/dL  0.8  0.5   Alkaline Phos 38 - 126 U/L  84  100   AST 15 - 41 U/L  14  12   ALT 0 - 44 U/L  18  10     Imaging: MR HAND LEFT WO CONTRAST  Result Date: 01/29/2023 CLINICAL DATA:  Dog bite.  Right fifth finger injury. EXAM: MRI OF THE LEFT HAND WITHOUT CONTRAST TECHNIQUE: Multiplanar, multisequence MR imaging of the left hand was performed. No intravenous contrast was administered. COMPARISON:  None Available. FINDINGS: Multiple areas of inflammation/edema/contusion involving the palmar and dorsal aspects of the fifth finger. No discrete abscess is identified without contrast. No obvious foreign body. No obvious gas in the soft tissues. Tenosynovitis involving the flexor tendon of the fifth finger. No evidence of tendon rupture. The flexor pulleys are intact. No findings suspicious for septic arthritis or osteomyelitis. No MR findings to suggest myofasciitis or pyomyositis. IMPRESSION: 1. Multiple areas of inflammations/edema/contusion involving the palmar and dorsal aspects of the fifth finger. No discrete abscess is identified without contrast. No obvious foreign body. 2. Tenosynovitis involving the flexor tendon of the fifth finger. No evidence of tendon rupture. 3. No findings suspicious for septic arthritis or osteomyelitis. 4. No MR findings to suggest myofasciitis or pyomyositis. Electronically  Signed   By: Rudie Meyer M.D.   On: 01/29/2023 16:12    Assessment/Plan:   Principal Problem:   Tenosynovitis of finger Active Problems:   Anxiety and depression   Dog bite  Fifth finger soft tissue infection S/p closed dog bite wound Improvement in edema and finger movement today. No signs of systemic infection today. MR hand with evidence of edema without discrete abscess or myofasciitis or pyomyositis. Ortho consulted; no emergent need for surgical debridement. Patient will continue on IV Unasyn for the next two days given high risk for tendon sheath infection and fasciitis.  -Unasyn IV q6 HR ( 01/2022 11PM - -Transition to Augmentin at discharge -Follow up A1C  Thrombocytopenia Normal on admission with decrease count overnight to 74K. No evidence of bleeding or liver abnormalities on admission. The main change during this admission is antibiotic initiation with Unasyn. There is no deficined frequency, but Unasyn has been reported to cause hematologic changes, including thrombocytopenia. Will plan to monitor witl follow up CBC in the AM with low threshold to transitioning her off medication. -CBC in the AM -Monitor for signs or symptoms of bleeding  Diet: Heart Healthy VTE: Enoxaparin Code: Full PT/OT recs: None  Dispo: Anticipated discharge to Home in 1-2 days pending clinical improvement.   Morene Crocker, MD Internal Medicine Resident PGY-1 Please contact the on call pager after 5 pm and on weekends at [phone].

## 2023-01-30 NOTE — Plan of Care (Signed)
  Problem: Activity: Goal: Risk for activity intolerance will decrease Outcome: Progressing   Problem: Coping: Goal: Level of anxiety will decrease Outcome: Progressing   Problem: Pain Managment: Goal: General experience of comfort will improve Outcome: Progressing   

## 2023-01-31 DIAGNOSIS — M659 Synovitis and tenosynovitis, unspecified: Secondary | ICD-10-CM | POA: Diagnosis not present

## 2023-01-31 DIAGNOSIS — W540XXA Bitten by dog, initial encounter: Secondary | ICD-10-CM | POA: Diagnosis not present

## 2023-01-31 LAB — CBC
HCT: 38.6 % (ref 36.0–46.0)
Hemoglobin: 13.3 g/dL (ref 12.0–15.0)
MCH: 31 pg (ref 26.0–34.0)
MCHC: 34.5 g/dL (ref 30.0–36.0)
MCV: 90 fL (ref 80.0–100.0)
Platelets: 197 10*3/uL (ref 150–400)
RBC: 4.29 MIL/uL (ref 3.87–5.11)
RDW: 12.5 % (ref 11.5–15.5)
WBC: 8.7 10*3/uL (ref 4.0–10.5)
nRBC: 0 % (ref 0.0–0.2)

## 2023-01-31 MED ORDER — AMOXICILLIN-POT CLAVULANATE 875-125 MG PO TABS: 1.0000 | ORAL_TABLET | Freq: Two times a day (BID) | ORAL | 0 refills | Status: AC

## 2023-01-31 NOTE — Discharge Summary (Signed)
Name: Kathryn Andrews MRN: [id number] DOB: December 23, 1995 27 y.o. PCP: Gwenevere Abbot, MD  Date of Admission: 01/29/2023  6:50 PM Date of Discharge:  01/31/2023 Attending Physician: Dr. Oswaldo Done  DISCHARGE DIAGNOSIS:  Primary Problem: Tenosynovitis of finger   Hospital Problems: Principal Problem:   Tenosynovitis of finger Active Problems:   Dog bite of left hand including fingers with infection    DISCHARGE MEDICATIONS:   Allergies as of 01/31/2023   No Known Allergies      Medication List     STOP taking these medications    esomeprazole 40 MG capsule Commonly known as: NEXIUM   rifaximin 550 MG Tabs tablet Commonly known as: XIFAXAN       TAKE these medications    amoxicillin-clavulanate 875-125 MG tablet Commonly known as: AUGMENTIN Take 1 tablet by mouth 2 (two) times daily for 5 days.   DULoxetine 30 MG capsule Commonly known as: CYMBALTA Take 1 capsule (30 mg total) by mouth daily.   hydrOXYzine 10 MG tablet Commonly known as: ATARAX Take 1 tablet (10 mg total) by mouth 3 (three) times daily as needed. What changed: reasons to take this   loratadine 10 MG tablet Commonly known as: CLARITIN Take 1 tablet (10 mg total) by mouth daily.   Ventolin HFA 108 (90 Base) MCG/ACT inhaler Generic drug: albuterol TAKE 2 PUFFS BY MOUTH EVERY 6 HOURS AS NEEDED FOR WHEEZE OR SHORTNESS OF BREATH What changed: See the new instructions.        DISPOSITION AND FOLLOW-UP:  Kathryn Andrews was discharged from [facility] in Stable condition. At the hospital follow up visit please address:  Skin and soft tissue infection of left fifth digit with concern for developing flexor tenosynovitis Patient to follow up with ortho in 1 week Discharged with 5 day course of Augmentin to complete 7 days of antibiotics   Follow-up Recommendations: Consults: Orthopedic surgery Labs:  none Studies: none New Medications: Augmentin 875-125 mg BID x 5  days  Follow-up Appointments:  Follow-up Information     Marrianne Mood, MD. Go on 02/05/2023.   Why: 2:15 PM appointment Contact information: [address] [place] [address] [phone]         Ramon Dredge, MD. Call.   Specialties: Orthopedic Surgery, Hand Surgery Why: Make appt to be seen in 1 week for a follow up of your finger infeciton Contact information: [address] [place] [address] [phone]                 HOSPITAL COURSE:  Patient Summary: Skin/soft tissue infection of left fifth digit Concern for developing flexor tenosynovitis The patient presented with swelling, redness, and pain of left fifth digit after a penetrating dog bite 3 days prior to arrival. The dog is domesticated and was vaccinated as a puppy, but had not received any vaccinations in the last several years. MRI of the left hand was obtained in the outpatient setting and was concerning for possible tenosynovitis involving the flexor tendon of the fifth digit, without any evidence of tendon rupture. The patient did not have any signs of systemic infection or rapidly progressive local infection at this time. Orthopedic surgery was consulted, however, they noted that this does not appear to be consistent with flexor tenosynovitis, or if it is, it is so early that it can be treated with IV antibiotics. She received 2 days of IV unasyn and was transitioned to PO Augmentin to complete a 7 day course, as her  finger was healing well and her range of motion was significantly improved. The patient will follow up with ortho in 1 week and will also follow up with the [facility] on 5/2. If the infection is still present at hospital follow up, could consider an additional 7 day course of augmentin to complete 14 days of therapy, but I suspect 7 days will be sufficient as she is already clinically improved.   Anxiety Patient has not taken her duloxetine for the past 2 months.  States that she is  forgetful but did not have any significant side effects. Restarted duloxetine 30 mg during this admission, as per patient preference.   IBS-D Symptoms have been stable recently without any severe abdominal cramps or significant diarrhea or constipation.    DISCHARGE INSTRUCTIONS:   Discharge Instructions     Call MD for:  redness, tenderness, or signs of infection (pain, swelling, redness, odor or green/yellow discharge around incision site)   Complete by: As directed    Call MD for:  severe uncontrolled pain   Complete by: As directed    Call MD for:  temperature >100.4   Complete by: As directed    Diet - low sodium heart healthy   Complete by: As directed    Discharge instructions   Complete by: As directed    Dear Kathryn Andrews,  You were hospitalized for an infection of your left pinky, which was caused by the dog bite. You were treated with two days of IV antibiotics, and you will need to take the rest of your antibiotics by mouth (pill). Please take Augmentin twice a day, for the next 5 days, to completely treat the infection.  The orthopedic surgeon, Dr. Kerry Fort, wants to see you in his office in about 1 week. Please call to make this appointment:  [phone].   You have a follow up appointment in our clinic on 5/2 at 2:15 PM with Dr. Benito Mccreedy. This will be to re-assess your infection, and to make sure you are healing well. If you have any questions or concerns, call our clinic at [phone] or after hours call [phone] and ask for the internal medicine resident on call.  Take care, Dr. Ned Card   Increase activity slowly   Complete by: As directed        SUBJECTIVE:  Kathryn Andrews was seen on the day of discharge. Her finger is less red and swollen, and her range of motion is improved. She denies any fevers, chills, n/v/d. She fells well overall.   Discharge Vitals:   BP 108/67 (BP Location: Right Arm)   Pulse (!) 55   Temp 98.3 F (36.8 C) (Oral)   Resp 16    SpO2 100%   OBJECTIVE:  General: Pleasant, well-appearing, obese female laying in bed. No acute distress. CV: RRR. No murmurs appreciated.  Pulmonary: Lungs CTAB. Normal effort.  Skin: L 5th digit with mild edema around MCP joint with minimal erythema. Full ROM and no tenderness to palpation.  Neuro: A&Ox3. No focal deficit. Psych: Normal mood and affect    Pertinent Labs, Studies, and Procedures:     Latest Ref Rng & Units 01/31/2023    2:09 AM 01/30/2023    4:50 AM 01/29/2023    8:45 PM  CBC  WBC 4.0 - 10.5 K/uL 8.7  7.2  8.4   Hemoglobin 12.0 - 15.0 g/dL 44.0  10.2  72.5   Hematocrit 36.0 - 46.0 % 38.6  42.8  41.6   Platelets 150 -  400 K/uL 197  74  240        Latest Ref Rng & Units 01/30/2023    4:50 AM 01/29/2023    8:45 PM 03/06/2021   12:23 PM  CMP  Glucose 70 - 99 mg/dL 85  87  88   BUN 6 - 20 mg/dL 7  8  15    Creatinine 0.44 - 1.00 mg/dL 1.61  0.96  0.45   Sodium 135 - 145 mmol/L 138  140  139   Potassium 3.5 - 5.1 mmol/L 3.8  3.8  3.9   Chloride 98 - 111 mmol/L 110  107  107   CO2 22 - 32 mmol/L 21  27  23    Calcium 8.9 - 10.3 mg/dL 8.5  9.2  9.5   Total Protein 6.5 - 8.1 g/dL  7.1  7.6   Total Bilirubin 0.3 - 1.2 mg/dL  0.8  0.5   Alkaline Phos 38 - 126 U/L  84  100   AST 15 - 41 U/L  14  12   ALT 0 - 44 U/L  18  10     MR HAND LEFT WO CONTRAST  Result Date: 01/29/2023 CLINICAL DATA:  Dog bite.  Right fifth finger injury. EXAM: MRI OF THE LEFT HAND WITHOUT CONTRAST TECHNIQUE: Multiplanar, multisequence MR imaging of the left hand was performed. No intravenous contrast was administered. COMPARISON:  None Available. FINDINGS: Multiple areas of inflammation/edema/contusion involving the palmar and dorsal aspects of the fifth finger. No discrete abscess is identified without contrast. No obvious foreign body. No obvious gas in the soft tissues. Tenosynovitis involving the flexor tendon of the fifth finger. No evidence of tendon rupture. The flexor pulleys are intact.  No findings suspicious for septic arthritis or osteomyelitis. No MR findings to suggest myofasciitis or pyomyositis. IMPRESSION: 1. Multiple areas of inflammations/edema/contusion involving the palmar and dorsal aspects of the fifth finger. No discrete abscess is identified without contrast. No obvious foreign body. 2. Tenosynovitis involving the flexor tendon of the fifth finger. No evidence of tendon rupture. 3. No findings suspicious for septic arthritis or osteomyelitis. 4. No MR findings to suggest myofasciitis or pyomyositis. Electronically Signed   By: Rudie Meyer M.D.   On: 01/29/2023 16:12     Signed: Elza Rafter, D.O.  Internal Medicine Resident, PGY-2 [facility]  Pager: [phone] 10:29 AM, 01/31/2023

## 2023-01-31 NOTE — Progress Notes (Signed)
Patient provided with taxi voucher home -approved by Ernestine Mcmurray, supervisor on call. Patient had no other means of transportation home.   Kathryn Vandemark, LCSW Transition of Care

## 2023-01-31 NOTE — TOC Transition Note (Addendum)
Transition of Care [facility]) - CM/SW Discharge Note   Patient Details  Name: Kathryn Andrews MRN: [id number] Date of Birth: 13-Jun-1996  Transition of Care [facility]) CM/SW Contact:  Tom-Johnson, Hershal Coria, RN Phone Number: 01/31/2023, 2:25 PM   Clinical Narrative:     Patient is scheduled for discharge today.  Readmission Prevention Assessment done.  Hospital f/u and discharge instructions on AVS.  Patient called cab for transportation.  No TOC needs or recommendations noted. No further TOC needs noted.    Final next level of care: Home/Self Care Barriers to Discharge: Barriers Resolved   Patient Goals and CMS Choice CMS Medicare.gov Compare Post Acute Care list provided to:: Patient Choice offered to / list presented to : NA  Discharge Placement                  Patient to be transferred to facility by: [facility]      Discharge Plan and Services Additional resources added to the After Visit Summary for                  DME Arranged: N/A DME Agency: NA       HH Arranged: NA HH Agency: NA        Social Determinants of Health (SDOH) Interventions SDOH Screenings   Food Insecurity: No Food Insecurity (10/16/2022)  Housing: Low Risk  (10/16/2022)  Transportation Needs: No Transportation Needs (10/16/2022)  Utilities: Not At Risk (10/16/2022)  Depression (PHQ2-9): High Risk (10/16/2022)  Tobacco Use: High Risk (01/15/2023)     Readmission Risk Interventions    01/31/2023    2:24 PM  Readmission Risk Prevention Plan  Post Dischage Appt Complete  Medication Screening Complete  Transportation Screening Complete

## 2023-02-02 ENCOUNTER — Telehealth: Payer: Self-pay

## 2023-02-02 NOTE — Addendum Note (Signed)
Addended by: Dickie La on: 02/02/2023 03:22 PM   Modules accepted: Level of Service

## 2023-02-02 NOTE — Progress Notes (Signed)
Internal Medicine Clinic Attending  Case discussed with Dr. McLendon  At the time of the visit.  We reviewed the resident's history and exam and pertinent patient test results.  I agree with the assessment, diagnosis, and plan of care documented in the resident's note.  

## 2023-02-02 NOTE — Transitions of Care (Post Inpatient/ED Visit) (Signed)
   02/02/2023  Name: Kathryn Andrews MRN: [id number] DOB: 1996-09-17  Today's TOC FU Call Status: Today's TOC FU Call Status:: Unsuccessul Call (1st Attempt) Unsuccessful Call (1st Attempt) Date: 02/02/23  Attempted to reach the patient regarding the most recent Inpatient/ED visit.  Follow Up Plan: Additional outreach attempts will be made to reach the patient to complete the Transitions of Care (Post Inpatient/ED visit) call.   Signature Karena Addison, LPN [facility] Nurse Health Advisor Direct Dial [phone]

## 2023-02-03 NOTE — Transitions of Care (Post Inpatient/ED Visit) (Signed)
   02/03/2023  Name: Kathryn Andrews MRN: [id number] DOB: April 26, 1996  Today's TOC FU Call Status: Today's TOC FU Call Status:: Successful TOC FU Call Competed Unsuccessful Call (1st Attempt) Date: 02/02/23 [facility] FU Call Complete Date: 02/03/23  Transition Care Management Follow-up Telephone Call Date of Discharge: 01/31/23 Discharge Facility: [facility]) Type of Discharge: Inpatient Admission Primary Inpatient Discharge Diagnosis:: sinovitis How have you been since you were released from the hospital?: Better Any questions or concerns?: No  Items Reviewed: Did you receive and understand the discharge instructions provided?: Yes Medications obtained and verified?: Yes (Medications Reviewed) Any new allergies since your discharge?: No Dietary orders reviewed?: Yes Do you have support at home?: No  Home Care and Equipment/Supplies: Were Home Health Services Ordered?: NA Any new equipment or medical supplies ordered?: NA  Functional Questionnaire: Do you need assistance with bathing/showering or dressing?: No Do you need assistance with meal preparation?: No Do you need assistance with eating?: No Do you have difficulty maintaining continence: No Do you need assistance with getting out of bed/getting out of a chair/moving?: No Do you have difficulty managing or taking your medications?: No  Follow up appointments reviewed: PCP Follow-up appointment confirmed?: Yes Date of PCP follow-up appointment?: 02/05/23 Follow-up Provider: [facility] Follow-up appointment confirmed?: NA Do you need transportation to your follow-up appointment?: No Do you understand care options if your condition(s) worsen?: Yes-patient verbalized understanding    SIGNATURE Karena Addison, LPN [facility] Nurse Health Advisor Direct Dial [phone]

## 2023-02-05 ENCOUNTER — Ambulatory Visit (INDEPENDENT_AMBULATORY_CARE_PROVIDER_SITE_OTHER): Payer: Medicaid Other | Admitting: Student

## 2023-02-05 ENCOUNTER — Encounter: Payer: Self-pay | Admitting: Student

## 2023-02-05 VITALS — BP 129/83 | HR 73 | Temp 98.1°F | Ht 68.5 in | Wt >= 6400 oz

## 2023-02-05 DIAGNOSIS — M65842 Other synovitis and tenosynovitis, left hand: Secondary | ICD-10-CM | POA: Diagnosis present

## 2023-02-05 DIAGNOSIS — Z0000 Encounter for general adult medical examination without abnormal findings: Secondary | ICD-10-CM

## 2023-02-05 DIAGNOSIS — M659 Synovitis and tenosynovitis, unspecified: Secondary | ICD-10-CM

## 2023-02-05 NOTE — Assessment & Plan Note (Signed)
Resolved tenosynovitis.  Will finish a 5-day course of Augmentin.  Instructed to follow-up with Ortho hand surgery.  Contact information placed in AVS.

## 2023-02-05 NOTE — Patient Instructions (Addendum)
Finish your antibiotics and call the hand surgeon for follow-up appointment.  Shaune Pollack, MD Hand and Upper Extremity Surgery [facility] [phone]  This after visit summary is an important review of tests, referrals, and medication changes that were discussed during your visit. If you have questions or concerns, call [phone]. Outside of clinic business hours, call the main hospital at [phone] and ask the operator for the on-call internal medicine resident.   Ernesta Amble MD 02/05/2023, 3:21 PM

## 2023-02-05 NOTE — Assessment & Plan Note (Signed)
>>  ASSESSMENT AND PLAN FOR DOG BITE OF LEFT HAND INCLUDING FINGERS WITH INFECTION WRITTEN ON 02/05/2023  3:43 PM BY Marrianne Mood, MD  Resolved tenosynovitis.  Will finish a 5-day course of Augmentin.  Instructed to follow-up with Ortho hand surgery.  Contact information placed in AVS.

## 2023-02-05 NOTE — Progress Notes (Signed)
    Subjective:  Kathryn Andrews is a 27 y.o. who presents to clinic for the following:  Follow-up for hospitalization after left fifth finger flexor tenosynovitis.  Treated with IV ampicillin-sulbactam.  No surgery.  Objective:   Vitals:   02/05/23 1430  BP: 129/83  Pulse: 73  Temp: 98.1 F (36.7 C)  TempSrc: Oral  SpO2: 97%  Weight: (!) 407 lb (184.6 kg)  Height: 5' 8.5" (1.74 m)    Physical Exam Overall well-appearing Breathing is regular and unlabored on room air Skin is warm and dry Left fifth finger without redness swelling pain or warmth Alert and oriented Pleasant, concordant affect  Assessment & Plan:   Tenosynovitis of finger >>ASSESSMENT AND PLAN FOR DOG BITE OF LEFT HAND INCLUDING FINGERS WITH INFECTION WRITTEN ON 02/05/2023  3:43 PM BY Marrianne Mood, MD  Resolved tenosynovitis.  Will finish a 5-day course of Augmentin.  Instructed to follow-up with Ortho hand surgery.  Contact information placed in AVS.  Healthcare maintenance Discussed Pap smear and importance of cervical cancer screening in people who have been sexually active.  Prefers to defer today.    Return in 4 months, for for routine follow-up.  Patient discussed with Dr. Michel Bickers MD 02/05/2023, 3:47 PM  [phone]

## 2023-02-05 NOTE — Addendum Note (Signed)
Addended by: Derrek Monaco on: 02/05/2023 04:24 PM   Modules accepted: Level of Service

## 2023-02-05 NOTE — Assessment & Plan Note (Signed)
Discussed Pap smear and importance of cervical cancer screening in people who have been sexually active.  Prefers to defer today.

## 2023-02-05 NOTE — Progress Notes (Signed)
Internal Medicine Clinic Attending  Case discussed with Dr. McLendon  At the time of the visit.  We reviewed the resident's history and exam and pertinent patient test results.  I agree with the assessment, diagnosis, and plan of care documented in the resident's note.  

## 2023-03-03 ENCOUNTER — Encounter: Payer: Self-pay | Admitting: Student

## 2023-04-06 ENCOUNTER — Encounter: Payer: Medicaid Other | Admitting: Student

## 2023-04-13 ENCOUNTER — Ambulatory Visit: Payer: MEDICAID | Admitting: Student

## 2023-04-13 VITALS — BP 126/86 | HR 54 | Temp 98.2°F | Ht 68.5 in | Wt >= 6400 oz

## 2023-04-13 DIAGNOSIS — F419 Anxiety disorder, unspecified: Secondary | ICD-10-CM | POA: Diagnosis not present

## 2023-04-13 DIAGNOSIS — F32A Depression, unspecified: Secondary | ICD-10-CM

## 2023-04-13 DIAGNOSIS — S39012D Strain of muscle, fascia and tendon of lower back, subsequent encounter: Secondary | ICD-10-CM | POA: Diagnosis not present

## 2023-04-13 MED ORDER — HYDROXYZINE HCL 10 MG PO TABS
10.0000 mg | ORAL_TABLET | Freq: Three times a day (TID) | ORAL | 0 refills | Status: DC | PRN
Start: 2023-04-13 — End: 2024-02-24

## 2023-04-13 MED ORDER — FLUOXETINE HCL 20 MG PO CAPS
20.0000 mg | ORAL_CAPSULE | Freq: Every day | ORAL | 1 refills | Status: DC
Start: 2023-04-13 — End: 2023-12-30

## 2023-04-13 MED ORDER — BACLOFEN 10 MG PO TABS
10.0000 mg | ORAL_TABLET | Freq: Two times a day (BID) | ORAL | 0 refills | Status: AC
Start: 2023-04-13 — End: 2023-04-20

## 2023-04-13 MED ORDER — HYDROXYZINE HCL 10 MG PO TABS
10.0000 mg | ORAL_TABLET | Freq: Three times a day (TID) | ORAL | 0 refills | Status: DC | PRN
Start: 2023-04-13 — End: 2023-04-13

## 2023-04-13 MED ORDER — BACLOFEN 10 MG PO TABS
10.0000 mg | ORAL_TABLET | Freq: Two times a day (BID) | ORAL | 0 refills | Status: DC
Start: 2023-04-13 — End: 2023-04-13

## 2023-04-13 MED ORDER — FLUOXETINE HCL 20 MG PO CAPS
20.00 mg | ORAL_CAPSULE | Freq: Every day | ORAL | 1 refills | Status: DC
Start: 2023-04-13 — End: 2023-04-13

## 2023-04-13 NOTE — Assessment & Plan Note (Addendum)
Patient with acute on chronic back pain after aggravating her back when moving a heavy item. Cramping type pain of the lower back. No urinary or fecal continonce, no numbness, Endorses some left leg weakness, and heaviness. Endorses sciatic pain down the right leg posterior leg. Pt in the past has helped, but says she is busy at home, and doesn't know if she can make it. On exam, patient has symmetric strength and reflexes of the LE's. Endorses posterior calf pain, with no swelling. Resolves with massage. Symptoms consistent with lumbosacral strain, with some aspect of sciatica. Her BMI is likely contributory. No alarm symptoms for cord involvement to warrant emergent management.   Plan: -7 day course baclofen BID, Naproxen PRN -PT referral -Referral Bariatric sx

## 2023-04-13 NOTE — Addendum Note (Signed)
Addended by: Lucille Passy on: 04/13/2023 05:18 PM   Modules accepted: Orders

## 2023-04-13 NOTE — Patient Instructions (Signed)
Thank you, Ms.[name] for allowing [place] to provide your care today. Today we discussed depression and back pain.    Referrals ordered today:   Referral Orders         Amb Referral to Bariatric Surgery         Ambulatory referral to Physical Therapy         Ambulatory referral to Integrated Behavioral Health      I have ordered the following medication/changed the following medications:   Stop the following medications: Medications Discontinued During This Encounter  Medication Reason   hydrOXYzine (ATARAX) 10 MG tablet Reorder     Start the following medications: Meds ordered this encounter  Medications   FLUoxetine (PROZAC) 20 MG capsule    Sig: Take 1 capsule (20 mg total) by mouth daily.    Dispense:  30 capsule    Refill:  1   hydrOXYzine (ATARAX) 10 MG tablet    Sig: Take 1 tablet (10 mg total) by mouth 3 (three) times daily as needed for anxiety.    Dispense:  30 tablet    Refill:  0   baclofen (LIORESAL) 10 MG tablet    Sig: Take 1 tablet (10 mg total) by mouth 2 (two) times daily for 7 days.    Dispense:  14 tablet    Refill:  0     Follow up:  1 month    We look forward to seeing you next time. Please call our clinic at [phone] if you have any questions or concerns. The best time to call is Monday-Friday from 9am-4pm, but there is someone available 24/7. If after hours or the weekend, call the main hospital number and ask for the Internal Medicine Resident On-Call. If you need medication refills, please notify your pharmacy one week in advance and they will send [place] a request.   Thank you for trusting me with your care. Wishing you the best!  Lovie Macadamia MD [facility]

## 2023-04-13 NOTE — Progress Notes (Signed)
Subjective:  CC: Depression, Back pain   HPI:  [name] is a 27 y.o. female with a past medical history stated below and presents today for help regarding her back pain and mood. Please see problem based assessment and plan for additional details.  Past Medical History:  Diagnosis Date   Abdominal pain, epigastric 03/08/2021   Anxiety    Asthma    Attention deficit hyperactivity disorder    Bloating 03/08/2021   Chest pain    Chronic constipation    Chronic diarrhea 03/08/2021   Chronic lower back pain 04/02/2020   Class 3 severe obesity without serious comorbidity with body mass index (BMI) of 60.0 to 69.9 in adult [facility]) 12/15/2021   Depression    Functional dyspepsia 02/28/2020   Generalized abdominal pain 12/15/2021   Irritable bowel syndrome with diarrhea 12/15/2021   Morbid obesity (HCC) 03/02/2020   Urinary frequency 04/02/2020    Current Outpatient Medications on File Prior to Visit  Medication Sig Dispense Refill   hydrOXYzine (ATARAX) 10 MG tablet Take 1 tablet (10 mg total) by mouth 3 (three) times daily as needed. (Patient taking differently: Take 10 mg by mouth 3 (three) times daily as needed for anxiety.) 30 tablet 3   loratadine (CLARITIN) 10 MG tablet Take 1 tablet (10 mg total) by mouth daily. 90 tablet 0   VENTOLIN HFA 108 (90 Base) MCG/ACT inhaler TAKE 2 PUFFS BY MOUTH EVERY 6 HOURS AS NEEDED FOR WHEEZE OR SHORTNESS OF BREATH (Patient taking differently: Inhale 1 puff into the lungs every 6 (six) hours as needed for shortness of breath.) 18 each 2   No current facility-administered medications on file prior to visit.    Family History  Problem Relation Age of Onset   Diabetes Paternal Grandmother    CAD Paternal Grandmother    Hypertension Paternal Grandmother    Hyperlipidemia Paternal Grandmother    Colon cancer Neg Hx    Esophageal cancer Neg Hx    Inflammatory bowel disease Neg Hx    Liver disease Neg Hx    Pancreatic cancer Neg Hx    Rectal  cancer Neg Hx    Stomach cancer Neg Hx     Social History   Socioeconomic History   Marital status: Single    Spouse name: Not on file   Number of children: Not on file   Years of education: Not on file   Highest education level: Not on file  Occupational History   Not on file  Tobacco Use   Smoking status: Every Day    Packs/day: .25    Types: Cigarettes   Smokeless tobacco: Never   Tobacco comments:    Vapes   Vaping Use   Vaping Use: Never used  Substance and Sexual Activity   Alcohol use: Yes    Comment: occasionally   Drug use: Yes    Types: Marijuana   Sexual activity: Not on file  Other Topics Concern   Not on file  Social History Narrative   Not on file   Social Determinants of Health   Financial Resource Strain: Not on file  Food Insecurity: No Food Insecurity (10/16/2022)   Hunger Vital Sign    Worried About Running Out of Food in the Last Year: Never true    Ran Out of Food in the Last Year: Never true  Transportation Needs: No Transportation Needs (10/16/2022)   PRAPARE - Transportation    Lack of Transportation (Medical): No    Lack  of Transportation (Non-Medical): No  Physical Activity: Not on file  Stress: Not on file  Social Connections: Not on file  Intimate Partner Violence: Not on file    Review of Systems: ROS negative except for what is noted on the assessment and plan.  Objective:   Vitals:   04/13/23 1420 04/13/23 1448  BP: (!) 145/87 126/86  Pulse: 61 (!) 54  Temp: 98.2 F (36.8 C)   TempSrc: Oral   SpO2: 100%   Weight: (!) 404 lb 11.2 oz (183.6 kg)   Height: 5' 8.5" (1.74 m)     Physical Exam: Constitutional: well-appearing in no acute distress HENT: normocephalic atraumatic, mucous membranes moist Eyes: conjunctiva non-erythematous Neck: supple Cardiovascular: regular rate and rhythm, no m/r/g Pulmonary/Chest: normal work of breathing on room air, lungs clear to auscultation bilaterally Abdominal: soft, non-tender,  non-distended MSK: normal bulk and tone Neurological: alert & oriented x 3, 5/5 strength in bilateral lower extremities, normal gait Skin: warm and dry Psych: soft spoken, linear thought, depressed affect   Assessment & Plan:  Anxiety and depression PHQ-9 score fo 14 today. Patient has anhedonia, depressed mood, insomnia, low energy, guilt. No manic episodes. Open to trying medication, and meeting with therapist.  Plan: -Referral behavioral health -Start Fluoxetine 20mg  -Refill Atarax 10mg  PRN  Low back strain Patient with acute on chronic back pain after aggravating her back when moving a heavy item. Cramping type pain of the lower back. No urinary or fecal continonce, no numbness, Endorses some left leg weakness, and heaviness. Endorses sciatic pain down the right leg posterior leg. Pt in the past has helped, but says she is busy at home, and doesn't know if she can make it. On exam, patient has symmetric strength and reflexes of the LE's. Endorses posterior calf pain, with no swelling. Resolves with massage. Symptoms consistent with lumbosacral strain, with some aspect of sciatica. Her BMI is likely contributory. No alarm symptoms for cord involvement to warrant emergent management.   Plan: -Referral Bariatric sx -7 day course baclofen BID, Naproxen PRN -PT referal   Patient seen with Dr. Cleda Daub  Lovie Macadamia MD [facility] Internal Medicine  PGY-1 Pager: [phone]  Phone: [phone] Date 04/13/2023  Time 3:24 PM

## 2023-04-13 NOTE — Assessment & Plan Note (Signed)
BMI > 60 today. On exam she has general deconditioning. She says her legs feel heavy and has trouble moving. Feels she has little energy, which may make it difficult for her to get into physical therapy.  Plan: Place referral to bariatric surgery

## 2023-04-13 NOTE — Assessment & Plan Note (Addendum)
PHQ-9 score fo 14 today. Patient has anhedonia, depressed mood, insomnia, low energy, guilt. No manic episodes. Open to trying medication, and meeting with therapist. Denies suicidal ideation. Has had thoughts of hurting herself in the past but has never acted upon them.  Plan: -Referral behavioral health -Start Fluoxetine 20mg  -Refill Atarax 10mg  PRN -F/u 1 month

## 2023-04-21 NOTE — Addendum Note (Signed)
Addended by: Carlynn Purl C on: 04/21/2023 09:04 AM   Modules accepted: Level of Service

## 2023-04-21 NOTE — Progress Notes (Signed)
Internal Medicine Clinic Attending  I was physically present during the key portions of the resident provided service and participated in the medical decision making of patient's management care. I reviewed pertinent patient test results.  The assessment, diagnosis, and plan were formulated together and I agree with the documentation in the resident's note.  Gust Rung, DO

## 2023-05-11 ENCOUNTER — Encounter: Payer: MEDICAID | Admitting: Student

## 2023-05-19 ENCOUNTER — Ambulatory Visit (INDEPENDENT_AMBULATORY_CARE_PROVIDER_SITE_OTHER): Payer: MEDICAID | Admitting: Licensed Clinical Social Worker

## 2023-05-19 DIAGNOSIS — F419 Anxiety disorder, unspecified: Secondary | ICD-10-CM | POA: Diagnosis not present

## 2023-05-19 DIAGNOSIS — F32A Depression, unspecified: Secondary | ICD-10-CM | POA: Diagnosis not present

## 2023-05-19 NOTE — BH Specialist Note (Signed)
Integrated Behavioral Health via Telemedicine Visit  05/19/2023 Kathryn Andrews [id number]  Number of Integrated Behavioral Health Clinician visits: 1- Initial Visit  Session Start time: 1500   Session End time: 1530  Total time in minutes: 30   Referring Provider: Carlynn Purl, MD Patient/Family location: Home  [facility] Provider location: Office All persons participating in visit: [facility] and Patient  Types of Service: Telephone visit and Introduction only  I connected with Kathryn Andrews and/or Kathryn Andrews  via  Telephone or Video Enabled Telemedicine Application  (Video is Caregility application) and verified that I am speaking with the correct person using two identifiers. Discussed confidentiality: Yes   I discussed the limitations of telemedicine and the availability of in person appointments.  Discussed there is a possibility of technology failure and discussed alternative modes of communication if that failure occurs.  I discussed that engaging in this telemedicine visit, they consent to the provision of behavioral healthcare and the services will be billed under their insurance.  Patient and/or legal guardian expressed understanding and consented to Telemedicine visit: Yes   Presenting Concerns: Patient and/or family reports the following symptoms/concerns: Kathryn Andrews introduced self on today and explained role and benefits of therapy. Patient agreed to services.  [facility] educated patient on counseling. Patient stated due to work she prefers video visits. Patient will contact office to schedule her upcoming visit.   Duration of problem: N/A; Severity of problem: moderate  Patient and/or Family's Strengths/Protective Factors: Sense of purpose  Goals Addressed: Patient will:  Reduce symptoms of: anxiety and depression   Increase knowledge and/or ability of: coping skills   Demonstrate ability to: Increase healthy adjustment to current life circumstances  Progress towards  Goals: Ongoing  Interventions: Interventions utilized:  Solution-Focused Strategies and Mindfulness or Relaxation Training Standardized Assessments completed: PHQ-SADS     04/13/2023    4:25 PM 02/05/2023    2:38 PM 10/16/2022    5:14 PM  PHQ-SADS Last 3 Score only  PHQ Adolescent Score 18 14 16      Patient and/or Family Response: Patient agreed to services.   Assessment: Patient currently experiencing Anxiety and Depression.   Patient may benefit from Ongoing therapy.  Plan: Follow up with behavioral health clinician on : Within the next 30 days   I discussed the assessment and treatment plan with the patient and/or parent/guardian. They were provided an opportunity to ask questions and all were answered. They agreed with the plan and demonstrated an understanding of the instructions.   They were advised to call back or seek an in-person evaluation if the symptoms worsen or if the condition fails to improve as anticipated.  Christen Butter, MSW, LCSW-A She/Her Behavioral Health Clinician [facility]  Internal Medicine Center Direct [phone]  Fax [phone] Main Office Phone: [phone] [address] [place] [place], [place] [address] Website: [facility] Internal Medicine [facility]  [place], [place]  [place]

## 2023-05-25 ENCOUNTER — Encounter: Payer: MEDICAID | Admitting: Student

## 2023-05-25 ENCOUNTER — Encounter: Payer: MEDICAID | Admitting: Licensed Clinical Social Worker

## 2023-06-24 ENCOUNTER — Ambulatory Visit: Payer: MEDICAID | Admitting: Gastroenterology

## 2023-07-02 ENCOUNTER — Telehealth: Payer: Self-pay

## 2023-07-02 NOTE — Telephone Encounter (Signed)
Pt  grand mother is requesting a call back ... 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

## 2023-07-06 ENCOUNTER — Encounter: Payer: Self-pay | Admitting: Internal Medicine

## 2023-07-08 ENCOUNTER — Ambulatory Visit: Payer: MEDICAID | Admitting: Licensed Clinical Social Worker

## 2023-07-08 DIAGNOSIS — F419 Anxiety disorder, unspecified: Secondary | ICD-10-CM | POA: Diagnosis not present

## 2023-07-08 NOTE — BH Specialist Note (Signed)
Integrated Behavioral Health via Telemedicine Visit  07/08/2023 Diksha Feo [id number]  Number of Integrated Behavioral Health Clinician visits: 2- Second Visit  Session Start time: 1430   Session End time: 1500  Total time in minutes: 30   Referring Provider: Gwenevere Abbot, MD  Patient/Family location: Home  [facility] Provider location: Office All persons participating in visit: [facility] and Patient Types of Service: Individual psychotherapy  I connected with [name]  via  Video Enabled Telemedicine Application  (Video is Caregility application) and verified that I am speaking with the correct person using two identifiers. Discussed confidentiality: Yes   I discussed the limitations of telemedicine and the availability of in person appointments.  Discussed there is a possibility of technology failure and discussed alternative modes of communication if that failure occurs.  I discussed that engaging in this telemedicine visit, they consent to the provision of behavioral healthcare and the services will be billed under their insurance.  Patient and/or legal guardian expressed understanding and consented to Telemedicine visit: Yes   Presenting Concerns: Patient and/or family reports the following symptoms/concerns: Patient reported receiving her disability check today, providing some financial relief. She disclosed the recent loss of a maternal figure, having already attended the funeral. The patient is experiencing crying spells but has support from her best friend. This loss appears to be significantly impacting her emotional state. The patient provided insight into her family dynamics. She has no relationship with her biological mother, last speaking 8 years ago. Her father is deceased. She currently lives with her grandmother, describing their relationship as occasionally positive. The patient has two siblings with whom she previously lived but no longer communicates. Social support  comes primarily from her best friend Johnny Bridge, with whom she grew up and speaks daily. Her goddaughter also calls every day, indicating a strong support system outside of biological family. The patient expressed plans to celebrate upcoming holidays. She intends to observe Halloween and will spend Thanksgiving with either her grandmother or best friend's family. Christmas celebrations are also planned. The patient mentioned a dislike of pain, which may warrant further exploration in future sessions to understand if this relates to physical or emotional pain. For the next session, the patient agreed to afternoon video visits, which may allow for a more comprehensive assessment of her emotional state and coping mechanisms. Moving forward, it will be important to monitor the patient's grief process, support system utilization, and overall emotional well-being, particularly in light of the recent loss and complex family dynamics.   Patient and/or Family's Strengths/Protective Factors: Social connections  Goals Addressed: Patient will:  Reduce symptoms of: anxiety   Increase knowledge and/or ability of: coping skills   Demonstrate ability to: Increase healthy adjustment to current life circumstances  Progress towards Goals: Ongoing  Interventions: Interventions utilized:  CBT Cognitive Behavioral Therapy Standardized Assessments completed: PHQ-SADS     04/13/2023    4:25 PM 02/05/2023    2:38 PM 10/16/2022    5:14 PM  PHQ-SADS Last 3 Score only  PHQ Adolescent Score 18 14 16      Patient and/or Family Response: Patient agreed to continue therapy   Assessment: Patient currently experiencing Anxiety.   Patient may benefit from CBT.  Plan: Follow up with behavioral health clinician on : Within the next 30 days  I discussed the assessment and treatment plan with the patient and/or parent/guardian. They were provided an opportunity to ask questions and all were answered. They agreed with the  plan and demonstrated an understanding of  the instructions.   They were advised to call back or seek an in-person evaluation if the symptoms worsen or if the condition fails to improve as anticipated.  Christen Butter, MSW, LCSW-A She/Her Behavioral Health Clinician [facility]  Internal Medicine Center Direct Dial:[phone]  Fax [phone] Main Office Phone: [phone] [address] [place] [place], [place] [address] Website: [facility] Internal Medicine [facility]  [place], [place]  [place]

## 2023-07-30 ENCOUNTER — Ambulatory Visit (INDEPENDENT_AMBULATORY_CARE_PROVIDER_SITE_OTHER): Payer: MEDICAID | Admitting: Licensed Clinical Social Worker

## 2023-07-30 DIAGNOSIS — F419 Anxiety disorder, unspecified: Secondary | ICD-10-CM

## 2023-07-30 NOTE — BH Specialist Note (Signed)
Integrated Behavioral Health via Telemedicine Visit  07/30/2023 Kathryn Andrews [id number]  Number of Integrated Behavioral Health Clinician visits: 2- Second Visit  Session Start time: 1430   Session End time: 1500  Total time in minutes: 30   Referring Provider: Gwenevere Abbot, MD   Patient/Family location: Home         [facility] Provider location: Office All persons participating in visit: [facility] and Patient Types of Service: Individual psychotherapy   I connected with Kathryn Andrews  via  Video Enabled Telemedicine Application  (Video is Caregility application) and verified that I am speaking with the correct person using two identifiers. Discussed confidentiality: Yes    I discussed the limitations of telemedicine and the availability of in person appointments.  Discussed there is a possibility of technology failure and discussed alternative modes of communication if that failure occurs.   I discussed that engaging in this telemedicine visit, they consent to the provision of behavioral healthcare and the services will be billed under their insurance.   Patient and/or legal guardian expressed understanding and consented to Telemedicine visit: Yes    Presenting Concerns: Patient and/or family reports the following symptoms/concerns: On 10/24, LCSW-A, [facility] Kathryn Andrews conducted a telehealth follow-up appointment via telephone with the patient. During the call, Kathryn Andrews reviewed the patient's progress since the last session and addressed any new concerns or developments. The patient's current mental health status, coping strategies, and any changes in their daily functioning were discussed. Kathryn Andrews provided supportive counseling and reinforced previously taught therapeutic techniques. Goals set in previous sessions were evaluated, and adjustments were made as needed. The patient was given the opportunity to ask questions and express any worries or challenges they've been facing. Kathryn Andrews offered  guidance and resources tailored to the patient's current needs. The session concluded with a brief summary of the discussion and a plan for the next steps in the patient's treatment. A follow-up appointment was scheduled, and the patient was reminded of available support resources between sessions.     Patient and/or Family's Strengths/Protective Factors: Social connections   Goals Addressed: Patient will:  Reduce symptoms of: anxiety   Increase knowledge and/or ability of: coping skills   Demonstrate ability to: Increase healthy adjustment to current life circumstances   Progress towards Goals: Ongoing   Interventions: Interventions utilized:  CBT Cognitive Behavioral Therapy Standardized Assessments completed: PHQ-SADS       04/13/2023    4:25 PM 02/05/2023    2:38 PM 10/16/2022    5:14 PM  PHQ-SADS Last 3 Score only  PHQ Adolescent Score 18 14 16       Patient and/or Family Response: Patient agreed to continue therapy    Assessment: Patient currently experiencing Anxiety.    Patient may benefit from CBT.   Plan: Follow up with behavioral health clinician on : Within the next 30 days   I discussed the assessment and treatment plan with the patient and/or parent/guardian. They were provided an opportunity to ask questions and all were answered. They agreed with the plan and demonstrated an understanding of the instructions.   They were advised to call back or seek an in-person evaluation if the symptoms worsen or if the condition fails to improve as anticipated.   Kathryn Andrews, MSW, LCSW-A She/Her Behavioral Health Clinician [facility]  Internal Medicine Center Direct Dial:[phone]  Fax [phone] Main Office Phone: [phone] [address] [place] [place], [place] [address] Website: [facility] Internal Medicine [facility]  [place], [place]  [place]

## 2023-09-19 ENCOUNTER — Emergency Department (HOSPITAL_COMMUNITY)
Admission: EM | Admit: 2023-09-19 | Discharge: 2023-09-20 | Disposition: A | Discharge status: Home / Self Care | Payer: MEDICAID | Attending: Emergency Medicine | Admitting: Emergency Medicine

## 2023-09-19 ENCOUNTER — Emergency Department (HOSPITAL_COMMUNITY): Payer: MEDICAID

## 2023-09-19 ENCOUNTER — Encounter (HOSPITAL_COMMUNITY): Payer: Self-pay | Admitting: *Deleted

## 2023-09-19 ENCOUNTER — Other Ambulatory Visit: Payer: Self-pay

## 2023-09-19 DIAGNOSIS — J101 Influenza due to other identified influenza virus with other respiratory manifestations: Secondary | ICD-10-CM | POA: Insufficient documentation

## 2023-09-19 DIAGNOSIS — E669 Obesity, unspecified: Secondary | ICD-10-CM | POA: Diagnosis not present

## 2023-09-19 DIAGNOSIS — J189 Pneumonia, unspecified organism: Secondary | ICD-10-CM | POA: Insufficient documentation

## 2023-09-19 DIAGNOSIS — R112 Nausea with vomiting, unspecified: Secondary | ICD-10-CM | POA: Diagnosis present

## 2023-09-19 DIAGNOSIS — Z1152 Encounter for screening for COVID-19: Secondary | ICD-10-CM | POA: Insufficient documentation

## 2023-09-19 DIAGNOSIS — Z6841 Body Mass Index (BMI) 40.0 and over, adult: Secondary | ICD-10-CM | POA: Diagnosis not present

## 2023-09-19 LAB — COMPREHENSIVE METABOLIC PANEL
ALT: 45 U/L — ABNORMAL HIGH (ref 0–44)
AST: 47 U/L — ABNORMAL HIGH (ref 15–41)
Albumin: 3.6 g/dL (ref 3.5–5.0)
Alkaline Phosphatase: 84 U/L (ref 38–126)
Anion gap: 14 (ref 5–15)
BUN: 8 mg/dL (ref 6–20)
CO2: 21 mmol/L — ABNORMAL LOW (ref 22–32)
Calcium: 9.1 mg/dL (ref 8.9–10.3)
Chloride: 104 mmol/L (ref 98–111)
Creatinine, Ser: 1.31 mg/dL — ABNORMAL HIGH (ref 0.44–1.00)
GFR, Estimated: 57 mL/min — ABNORMAL LOW (ref >60)
Glucose, Bld: 114 mg/dL — ABNORMAL HIGH (ref 70–99)
Potassium: 3.3 mmol/L — ABNORMAL LOW (ref 3.5–5.1)
Sodium: 139 mmol/L (ref 135–145)
Total Bilirubin: 1.5 mg/dL — ABNORMAL HIGH (ref <1.2)
Total Protein: 7.4 g/dL (ref 6.5–8.1)

## 2023-09-19 LAB — CBC WITH DIFFERENTIAL/PLATELET
Abs Immature Granulocytes: 0.13 10*3/uL — ABNORMAL HIGH (ref 0.00–0.07)
Basophils Absolute: 0 10*3/uL (ref 0.0–0.1)
Basophils Relative: 0 %
Eosinophils Absolute: 0.1 10*3/uL (ref 0.0–0.5)
Eosinophils Relative: 0 %
HCT: 42.1 % (ref 36.0–46.0)
Hemoglobin: 14 g/dL (ref 12.0–15.0)
Immature Granulocytes: 1 %
Lymphocytes Relative: 3 %
Lymphs Abs: 0.4 10*3/uL — ABNORMAL LOW (ref 0.7–4.0)
MCH: 30.2 pg (ref 26.0–34.0)
MCHC: 33.3 g/dL (ref 30.0–36.0)
MCV: 90.9 fL (ref 80.0–100.0)
Monocytes Absolute: 0.7 10*3/uL (ref 0.1–1.0)
Monocytes Relative: 5 %
Neutro Abs: 12.9 10*3/uL — ABNORMAL HIGH (ref 1.7–7.7)
Neutrophils Relative %: 91 %
Platelets: 169 10*3/uL (ref 150–400)
RBC: 4.63 MIL/uL (ref 3.87–5.11)
RDW: 12.6 % (ref 11.5–15.5)
WBC: 14.2 10*3/uL — ABNORMAL HIGH (ref 4.0–10.5)
nRBC: 0 % (ref 0.0–0.2)

## 2023-09-19 LAB — PROTIME-INR
INR: 1.2 (ref 0.8–1.2)
Prothrombin Time: 15.8 s — ABNORMAL HIGH (ref 11.4–15.2)

## 2023-09-19 LAB — HCG, SERUM, QUALITATIVE: Preg, Serum: NEGATIVE

## 2023-09-19 LAB — RESP PANEL BY RT-PCR (RSV, FLU A&B, COVID)  RVPGX2
Influenza A by PCR: POSITIVE — AB
Influenza B by PCR: NEGATIVE
Resp Syncytial Virus by PCR: NEGATIVE
SARS Coronavirus 2 by RT PCR: NEGATIVE

## 2023-09-19 LAB — I-STAT CG4 LACTIC ACID, ED: Lactic Acid, Venous: 1.2 mmol/L (ref 0.5–1.9)

## 2023-09-19 MED ORDER — VANCOMYCIN HCL 500 MG/100ML IV SOLN
500.0000 mg | Freq: Once | INTRAVENOUS | Status: DC
  Filled 2023-09-19: qty 100

## 2023-09-19 MED ORDER — ACETAMINOPHEN 325 MG PO TABS
650.0000 mg | ORAL_TABLET | Freq: Once | ORAL | Status: AC
  Administered 2023-09-19: 650 mg via ORAL
  Filled 2023-09-19: qty 2

## 2023-09-19 MED ORDER — VANCOMYCIN HCL IN DEXTROSE 1-5 GM/200ML-% IV SOLN
1000.0000 mg | Freq: Once | INTRAVENOUS | Status: DC
Start: 2023-09-19 — End: 2023-09-19

## 2023-09-19 MED ORDER — IBUPROFEN 400 MG PO TABS
600.0000 mg | ORAL_TABLET | Freq: Once | ORAL | Status: AC
  Administered 2023-09-19: 600 mg via ORAL
  Filled 2023-09-19: qty 1

## 2023-09-19 MED ORDER — ONDANSETRON 4 MG PO TBDP
8.0000 mg | ORAL_TABLET | Freq: Once | ORAL | Status: AC
  Administered 2023-09-19: 8 mg via ORAL
  Filled 2023-09-19: qty 2

## 2023-09-19 MED ORDER — IOHEXOL 350 MG/ML SOLN
100.0000 mL | Freq: Once | INTRAVENOUS | Status: AC | PRN
  Administered 2023-09-19: 100 mL via INTRAVENOUS

## 2023-09-19 MED ORDER — SODIUM CHLORIDE 0.9 % IV SOLN
500.0000 mg | Freq: Once | INTRAVENOUS | Status: AC
  Administered 2023-09-19: 500 mg via INTRAVENOUS
  Filled 2023-09-19: qty 5

## 2023-09-19 MED ORDER — VANCOMYCIN HCL IN DEXTROSE 1-5 GM/200ML-% IV SOLN: 1000.0000 mg | Freq: Once | INTRAVENOUS | Status: DC

## 2023-09-19 MED ORDER — SODIUM CHLORIDE 0.9 % IV SOLN
1.0000 g | Freq: Once | INTRAVENOUS | Status: AC
  Administered 2023-09-19: 1 g via INTRAVENOUS
  Filled 2023-09-19: qty 10

## 2023-09-19 MED ORDER — METRONIDAZOLE 500 MG/100ML IV SOLN: 500.0000 mg | Freq: Once | INTRAVENOUS | Status: DC

## 2023-09-19 MED ORDER — SODIUM CHLORIDE 0.9% FLUSH
10.0000 mL | Freq: Two times a day (BID) | INTRAVENOUS | Status: DC
  Administered 2023-09-19: 10 mL via INTRAVENOUS

## 2023-09-19 MED ORDER — SODIUM CHLORIDE 0.9 % IV SOLN: 2.0000 g | Freq: Once | INTRAVENOUS | Status: DC

## 2023-09-19 NOTE — ED Notes (Signed)
1st lactic in normal range, 2nd can be discontinued unless Dr says otherwise

## 2023-09-19 NOTE — ED Triage Notes (Signed)
The pt is not answering questions asked  she reports that she has had vomiting a week no tylenol    lmp unknown

## 2023-09-19 NOTE — Progress Notes (Signed)
ED Pharmacy Antibiotic Sign Off An antibiotic consult was received from an ED provider for Cefepime and Vancomycin per pharmacy dosing for Sepsis. A chart review was completed to assess appropriateness.   The following one time order(s) were placed:  Cefepime 2g IV x1 Vancomycin 2500mg  loading dose (given as 1000mg  x2, then 500mg  x1)   Further antibiotic and/or antibiotic pharmacy consults should be ordered by the admitting provider if indicated.   Thank you for allowing pharmacy to be a part of this patient's care.   Kathryn Andrews, PharmD, BCPS Clinical Pharmacist 09/19/2023 10:20 PM   Please refer to [facility] for pharmacy phone number

## 2023-09-19 NOTE — ED Provider Triage Note (Signed)
Emergency Medicine Provider Triage Evaluation Note  Kathryn Andrews , a 27 y.o. female  was evaluated in triage.  Pt complains of fever, vomiting.  Has some generalized abdominal pain.  Not answering many questions in the room.  Review of Systems  Positive: Fever, emesis Negative:   Physical Exam  BP 129/84 (BP Location: Right Arm)   Pulse (!) 105   Temp (!) 101.9 F (38.8 C)   Resp 18   Ht 5\' 8"  (1.727 m)   Wt (!) 183.6 kg   SpO2 93%   BMI 61.54 kg/m  Gen:   Awake, no distress   Resp:  Normal effort  MSK:   Moves extremities without difficulty  Other:    Medical Decision Making  Medically screening exam initiated at 8:11 PM.  Appropriate orders placed.  [name] was informed that the remainder of the evaluation will be completed by another provider, this initial triage assessment does not replace that evaluation, and the importance of remaining in the ED until their evaluation is complete.  Fever, emesis   Lachae Hohler A, PA-C 09/19/23 2016

## 2023-09-19 NOTE — ED Notes (Signed)
Ambulated and sats remained at 95% and above.

## 2023-09-19 NOTE — Sepsis Progress Note (Signed)
Elink following code sepsis °

## 2023-09-20 MED ORDER — AMOXICILLIN 500 MG PO CAPS: 1000.0000 mg | ORAL_CAPSULE | Freq: Three times a day (TID) | ORAL | 0 refills | Status: AC

## 2023-09-20 MED ORDER — OSELTAMIVIR PHOSPHATE 75 MG PO CAPS: 75.0000 mg | ORAL_CAPSULE | Freq: Two times a day (BID) | ORAL | 0 refills | Status: DC

## 2023-09-20 NOTE — ED Provider Notes (Signed)
[place] EMERGENCY DEPARTMENT AT [facility] Provider Note   CSN: [id number] Arrival date & time: 09/19/23  1911     History  Chief Complaint  Patient presents with   Fever    Kathryn Andrews is a 27 y.o. female.  Patient presents to the emergency department reporting some nausea and vomiting with mild abdominal pain which occurred earlier in the day.  Patient also reports feeling tired and "sick".  Patient is very sleepy during my assessment and is not very forthcoming with answers.  She does deny shortness of breath, chest pain, urinary symptoms.  Past medical history significant for class III severe obesity, anxiety and depression, IBS with both constipation and diarrhea, ADHD   Fever      Home Medications Prior to Admission medications   Medication Sig Start Date End Date Taking? Authorizing Provider  amoxicillin (AMOXIL) 500 MG capsule Take 2 capsules (1,000 mg total) by mouth 3 (three) times daily for 7 days. 09/20/23 09/27/23 Yes Darrick Grinder, PA-C  oseltamivir (TAMIFLU) 75 MG capsule Take 1 capsule (75 mg total) by mouth every 12 (twelve) hours. 09/20/23  Yes Darrick Grinder, PA-C  FLUoxetine (PROZAC) 20 MG capsule Take 1 capsule (20 mg total) by mouth daily. 04/13/23   Masters, Florentina Addison, DO  hydrOXYzine (ATARAX) 10 MG tablet Take 1 tablet (10 mg total) by mouth 3 (three) times daily as needed for anxiety. 04/13/23   Masters, Katie, DO  loratadine (CLARITIN) 10 MG tablet Take 1 tablet (10 mg total) by mouth daily. 06/13/22 01/29/23  Gwenevere Abbot, MD  VENTOLIN HFA 108 (90 Base) MCG/ACT inhaler TAKE 2 PUFFS BY MOUTH EVERY 6 HOURS AS NEEDED FOR WHEEZE OR SHORTNESS OF BREATH Patient taking differently: Inhale 1 puff into the lungs every 6 (six) hours as needed for shortness of breath. 07/04/22   Gwenevere Abbot, MD      Allergies    Patient has no known allergies.    Review of Systems   Review of Systems  Constitutional:  Positive for fever.    Physical  Exam Updated Vital Signs BP 120/77   Pulse 85   Temp (!) 101.8 F (38.8 C) (Oral)   Resp 19   Ht 5\' 8"  (1.727 m)   Wt (!) 183.6 kg   SpO2 95%   BMI 61.54 kg/m  Physical Exam Vitals and nursing note reviewed.  Constitutional:      General: She is not in acute distress.    Appearance: She is well-developed. She is obese.  HENT:     Head: Normocephalic and atraumatic.  Eyes:     Conjunctiva/sclera: Conjunctivae normal.  Cardiovascular:     Rate and Rhythm: Normal rate and regular rhythm.     Comments: Sounds are distant due to body habitus Pulmonary:     Effort: Pulmonary effort is normal. No respiratory distress.     Breath sounds: Normal breath sounds.     Comments: Sounds are distant due to body habitus Abdominal:     Palpations: Abdomen is soft.     Tenderness: There is abdominal tenderness (Generalized).  Musculoskeletal:        General: No swelling.     Cervical back: Neck supple.     Right lower leg: No edema.     Left lower leg: No edema.  Skin:    General: Skin is warm and dry.     Capillary Refill: Capillary refill takes less than 2 seconds.  Neurological:     Mental Status:  She is alert and oriented to person, place, and time.  Psychiatric:        Mood and Affect: Mood normal.     ED Results / Procedures / Treatments   Labs (all labs ordered are listed, but only abnormal results are displayed) Labs Reviewed  RESP PANEL BY RT-PCR (RSV, FLU A&B, COVID)  RVPGX2 - Abnormal; Notable for the following components:      Result Value   Influenza A by PCR POSITIVE (*)    All other components within normal limits  COMPREHENSIVE METABOLIC PANEL - Abnormal; Notable for the following components:   Potassium 3.3 (*)    CO2 21 (*)    Glucose, Bld 114 (*)    Creatinine, Ser 1.31 (*)    AST 47 (*)    ALT 45 (*)    Total Bilirubin 1.5 (*)    GFR, Estimated 57 (*)    All other components within normal limits  CBC WITH DIFFERENTIAL/PLATELET - Abnormal; Notable for  the following components:   WBC 14.2 (*)    Neutro Abs 12.9 (*)    Lymphs Abs 0.4 (*)    Abs Immature Granulocytes 0.13 (*)    All other components within normal limits  PROTIME-INR - Abnormal; Notable for the following components:   Prothrombin Time 15.8 (*)    All other components within normal limits  CULTURE, BLOOD (ROUTINE X 2)  CULTURE, BLOOD (ROUTINE X 2)  HCG, SERUM, QUALITATIVE  URINALYSIS, W/ REFLEX TO CULTURE (INFECTION SUSPECTED)  I-STAT CG4 LACTIC ACID, ED  I-STAT CG4 LACTIC ACID, ED    EKG None  Radiology CT ABDOMEN PELVIS W CONTRAST Result Date: 09/19/2023 CLINICAL DATA:  Fever, acute nonlocalized abdominal pain. EXAM: CT ABDOMEN AND PELVIS WITH CONTRAST TECHNIQUE: Multidetector CT imaging of the abdomen and pelvis was performed using the standard protocol following bolus administration of intravenous contrast. RADIATION DOSE REDUCTION: This exam was performed according to the departmental dose-optimization program which includes automated exposure control, adjustment of the mA and/or kV according to patient size and/or use of iterative reconstruction technique. CONTRAST:  OMNIPAQUE IOHEXOL 350 MG/ML SOLN COMPARISON:  None Available. FINDINGS: Lower chest: Multifocal asymmetric pulmonary infiltrates are seen within the visualized lung bases, more severe within the visualized left lower lobe, in keeping with multifocal infection or aspiration. No pleural effusion. Cardiac size within normal limits. Hepatobiliary: Cholelithiasis without pericholecystic inflammatory change noted liver unremarkable. No intra or extrahepatic biliary ductal dilation. Pancreas: Unremarkable Spleen: Unremarkable Adrenals/Urinary Tract: Adrenal glands are unremarkable. Kidneys are normal, without renal calculi, focal lesion, or hydronephrosis. Bladder is unremarkable. Stomach/Bowel: Stomach is within normal limits. Appendix appears normal. Fluid within the distal colon may present clinically as  diarrhea. The small and large bowel are otherwise unremarkable. No free intraperitoneal gas or fluid. Vascular/Lymphatic: No significant vascular findings are present. No enlarged abdominal or pelvic lymph nodes. Reproductive: Uterus and bilateral adnexa are unremarkable. Other: Tiny fat containing umbilical hernia. Musculoskeletal: No acute or significant osseous findings. IMPRESSION: 1. Multifocal asymmetric pulmonary infiltrates within the visualized lung bases, more severe within the visualized left lower lobe, in keeping with multifocal infection or aspiration. 2. Fluid within the distal colon may present clinically as diarrhea. 3. Cholelithiasis. Electronically Signed   By: Helyn Numbers M.D.   On: 09/19/2023 22:42   DG Chest 2 View Result Date: 09/19/2023 CLINICAL DATA:  Suspected sepsis EXAM: CHEST - 2 VIEW COMPARISON:  10/11/2020 FINDINGS: Low lung volumes. Patchy infiltrate suspected at the left base. Normal cardiac  size. No pneumothorax IMPRESSION: Low lung volumes with patchy infiltrate suspected at the left base. Electronically Signed   By: Jasmine Pang M.D.   On: 09/19/2023 21:23    Procedures Procedures    Medications Ordered in ED Medications  sodium chloride flush (NS) 0.9 % injection 10 mL (10 mLs Intravenous Given 09/19/23 2241)  acetaminophen (TYLENOL) tablet 650 mg (650 mg Oral Given 09/19/23 2003)  ondansetron (ZOFRAN-ODT) disintegrating tablet 8 mg (8 mg Oral Given 09/19/23 1958)  iohexol (OMNIPAQUE) 350 MG/ML injection 100 mL (100 mLs Intravenous Contrast Given 09/19/23 2220)  cefTRIAXone (ROCEPHIN) 1 g in sodium chloride 0.9 % 100 mL IVPB (0 g Intravenous Stopped 09/19/23 2345)  azithromycin (ZITHROMAX) 500 mg in sodium chloride 0.9 % 250 mL IVPB (0 mg Intravenous Stopped 09/20/23 0008)  ibuprofen (ADVIL) tablet 600 mg (600 mg Oral Given 09/19/23 2351)    ED Course/ Medical Decision Making/ A&P                                 Medical Decision Making  This  patient presents to the ED for concern of nausea, vomiting, abdominal pain, this involves an extensive number of treatment options, and is a complaint that carries with it a high risk of complications and morbidity.  The differential diagnosis includes appendicitis, cholecystitis, gastroenteritis, gastritis, pneumonia, others   Co morbidities that complicate the patient evaluation  Obesity, IBS   Additional history obtained:   External records from outside source obtained and reviewed including internal medicine notes   Lab Tests:  I Ordered, and personally interpreted labs.  The pertinent results include: Flu a positive, lactic acid 1.2, WBC 14,200, creatinine 1.31   Imaging Studies ordered:  I ordered imaging studies including chest x-ray and CT abdomen pelvis with contrast I independently visualized and interpreted imaging which showed patchy infiltrate at left base on chest x-ray. 1. Multifocal asymmetric pulmonary infiltrates within the visualized  lung bases, more severe within the visualized left lower lobe, in  keeping with multifocal infection or aspiration.  2. Fluid within the distal colon may present clinically as diarrhea.  3. Cholelithiasis.   I agree with the radiologist interpretation   Cardiac Monitoring: / EKG:  The patient was maintained on a cardiac monitor.  I personally viewed and interpreted the cardiac monitored which showed an underlying rhythm of: Sinus rhythm   Problem List / ED Course / Critical interventions / Medication management   I ordered medication including Rocephin and azithromycin for antibiotic coverage, Tylenol and Advil for fever and bodyaches, Zofran for nausea  Reevaluation of the patient after these medicines showed that the patient improved I have reviewed the patients home medicines and have made adjustments as needed   Social Determinants of Health:  Patient is a daily tobacco user   Test / Admission -  Considered:  Patient with flu A and pneumonia on imaging.  Patient ambulated maintaining room air saturations 95+ percent.  Patient does have slightly decreased kidney function just shy of AKI definitions.  No indication at this time for admission. Will discharge home with prescriptions for antibiotics for CAP and tamiflu.          Final Clinical Impression(s) / ED Diagnoses Final diagnoses:  Influenza A  Community acquired pneumonia, unspecified laterality    Rx / DC Orders ED Discharge Orders          Ordered    oseltamivir (TAMIFLU) 75 MG  capsule  Every 12 hours        09/20/23 0103    amoxicillin (AMOXIL) 500 MG capsule  3 times daily        09/20/23 0103              Pamala Duffel 09/20/23 0105    Palumbo, April, MD 09/20/23 0130

## 2023-09-20 NOTE — Discharge Instructions (Signed)
You were diagnosed today with both influenza and pneumonia.  I have prescribed antibiotics along with Tamiflu.  Please take medications as prescribed.  He may take ibuprofen or acetaminophen for fever and pain control.  It is recommended that you follow-up in approximately 4 weeks to check for resolution of pneumonia by chest x-ray.  If you develop any life-threatening symptoms return to the emergency department.

## 2023-09-21 ENCOUNTER — Encounter: Payer: MEDICAID | Admitting: Student

## 2023-09-21 ENCOUNTER — Telehealth: Payer: Self-pay | Admitting: *Deleted

## 2023-09-21 NOTE — Telephone Encounter (Signed)
An appt became open for this afternoon; pt's grandmother was called. Appt scheduled with Dr Annie Paras @ 1415 PM.

## 2023-09-21 NOTE — Telephone Encounter (Signed)
Received a call from pt's grandmother, Kathryn Andrews, who stated pt went to the ER (12/14) dx with PNA and Flu and was prescribed ABX. She stated they should had kept her and put her in the hospital for a day. Stated when pt went home, she c/o h/a, cough,fever,throwing up,trouble breathing and barely moving.  Stated she has been giving the pt liquids,soups. She stated she's 27 yo and too old to take care of her; "she needs an Aide and I need some help". The GM stated pt is currently sleeping. No available appts today not this week at this time. The GM stated she can bring pt today (GM's aide leaves at 1pm today) ; Wed or Friday when she has transportation.

## 2023-09-23 ENCOUNTER — Ambulatory Visit: Payer: MEDICAID | Admitting: Gastroenterology

## 2023-09-24 ENCOUNTER — Ambulatory Visit: Payer: MEDICAID | Admitting: Student

## 2023-09-24 VITALS — BP 127/65 | HR 75 | Temp 98.5°F | Ht 68.0 in | Wt 394.5 lb

## 2023-09-24 DIAGNOSIS — N179 Acute kidney failure, unspecified: Secondary | ICD-10-CM

## 2023-09-24 DIAGNOSIS — J309 Allergic rhinitis, unspecified: Secondary | ICD-10-CM

## 2023-09-24 DIAGNOSIS — N939 Abnormal uterine and vaginal bleeding, unspecified: Secondary | ICD-10-CM

## 2023-09-24 DIAGNOSIS — J101 Influenza due to other identified influenza virus with other respiratory manifestations: Secondary | ICD-10-CM

## 2023-09-24 LAB — CULTURE, BLOOD (ROUTINE X 2)
Culture: NO GROWTH
Culture: NO GROWTH
Special Requests: ADEQUATE
Special Requests: ADEQUATE

## 2023-09-24 MED ORDER — ALBUTEROL SULFATE HFA 108 (90 BASE) MCG/ACT IN AERS: 1.0000 | INHALATION_SPRAY | Freq: Four times a day (QID) | RESPIRATORY_TRACT | 2 refills | Status: DC | PRN

## 2023-09-24 NOTE — Patient Instructions (Signed)
  Thank you, [name], for allowing [place] to provide your care today. Today we discussed . . .  > Flu       -Think your flu illness is causing your persistent cough and shortness of breath as well as your nausea, vomiting, and diarrhea.  I am going to send the lab to make sure that you are staying well-hydrated and that your electrolytes are doing okay.  I have also refilled your albuterol inhaler and I would like you to use this every 6 hours as needed for shortness of breath.  I do not think you need more antibiotics at this time.  Please call our clinic if you have any new or worsening symptoms. > Uterine bleeding       -Due to your abnormal uterine bleeding I would like to refer you to OB/GYN to get further evaluated.  They should call you to schedule this.  I am also checking your blood counts to make sure that you are not losing too much blood.   I have ordered the following labs for you:   Lab Orders         CBC with Diff         CMP14 + Anion Gap       Referrals ordered today:    Referral Orders         Ambulatory referral to Obstetrics / Gynecology       I have ordered the following medication/changed the following medications:   Stop the following medications: Medications Discontinued During This Encounter  Medication Reason   VENTOLIN HFA 108 (90 Base) MCG/ACT inhaler Reorder     Start the following medications: Meds ordered this encounter  Medications   albuterol (VENTOLIN HFA) 108 (90 Base) MCG/ACT inhaler    Sig: Inhale 1-2 puffs into the lungs every 6 (six) hours as needed for wheezing or shortness of breath.    Dispense:  18 each    Refill:  2      Follow up:  6-8 weeks     Remember:     Should you have any questions or concerns please call the internal medicine clinic at [phone].     Rocky Morel, DO [facility]

## 2023-09-24 NOTE — Progress Notes (Signed)
CC: Hospital Follow Up   HPI:  Kathryn Andrews is a 27 y.o. female with pertinent PMH of anxiety/depression and class III obesity presents after being influenza A positive with to have left lower lobe infiltrate on chest x-ray in the emergency department on 09/19/2023. Please see assessment and plan below for further details.  Past Medical History:  Diagnosis Date   Abdominal pain, epigastric 03/08/2021   Anxiety    Asthma    Attention deficit hyperactivity disorder    Bloating 03/08/2021   Chest pain    Chronic constipation    Chronic diarrhea 03/08/2021   Chronic lower back pain 04/02/2020   Class 3 severe obesity without serious comorbidity with body mass index (BMI) of 60.0 to 69.9 in adult [facility]) 12/15/2021   Depression    Functional dyspepsia 02/28/2020   Generalized abdominal pain 12/15/2021   Irritable bowel syndrome with diarrhea 12/15/2021   Morbid obesity (HCC) 03/02/2020   Urinary frequency 04/02/2020    Current Outpatient Medications  Medication Instructions   albuterol (VENTOLIN HFA) 108 (90 Base) MCG/ACT inhaler 1-2 puffs, Inhalation, Every 6 hours PRN   FLUoxetine (PROZAC) 20 mg, Oral, Daily   hydrOXYzine (ATARAX) 10 mg, Oral, 3 times daily PRN   loratadine (CLARITIN) 10 mg, Oral, Daily   oseltamivir (TAMIFLU) 75 mg, Oral, Every 12 hours     Review of Systems:   Pertinent items noted in HPI and/or A&P.  Physical Exam:  Vitals:   09/24/23 1316  BP: 127/65  Pulse: 75  Temp: 98.5 F (36.9 C)  TempSrc: Oral  Weight: (!) 394 lb 8 oz (178.9 kg)  Height: 5\' 8"  (1.727 m)    Constitutional: Obese young adult female. In no acute distress. HEENT: Normocephalic, atraumatic, Sclera non-icteric, PERRL, EOM intact Cardio:Regular rate and rhythm. 2+ bilateral radial pulses. Pulm:Clear to auscultation bilaterally. Normal work of breathing on room air. Abdomen: Soft, non-tender, non-distended, positive bowel sounds. ZOX:[id number] for extremity edema. Skin:Warm and  dry. Neuro:Alert and oriented x3. No focal deficit noted.   Assessment & Plan:   Influenza A Patient presents for an ED follow-up after being found to be influenza A positive and have possible left lower lobe infiltrate on chest x-ray in the emergency department on 09/19/2023.  Since then she has had continued dyspnea on exertion, cough with clear mucus production, mild abdominal pain with nausea and vomiting as well as diarrhea.  She has noticed some small improvements overall in her symptoms and she has been keeping fluids down well.  At the ED she had an AKI with her creatinine at 1.31 from a baseline of 0.90 as well as a mild transaminitis and hypokalemia.  Repeat labs today show mildly worsening transaminitis and improved potassium of 3.4.  I encouraged her to continue to hydrate well and gave return precautions.  Overall consistent with flu infection and low suspicion for hepatitis. - Continue supportive care at home  AKI (acute kidney injury) (HCC) AKI during ED visit on 09/19/2023 with creatinine bump from baseline of 0.90-1.31.  Metabolic panel today shows creatinine back to baseline of 0.89.  Abnormal uterine bleeding Patient presents with abnormally heavy menstrual period over the last 5 days.  Usually her menstrual periods last about 5 days and she uses about 6 pads per day but unfortunately is ran out of pads recently.  She does not know how much she has been using but knows it is definitely heavier than usual.  Usually she has a menstrual period every 1-2 months and  her last one was 4 weeks ago.  She denies any new or worsening pain with her menstruation.  Refused pelvic exam today.  Discussed referral to OB/GYN for further evaluation and management of abnormal uterine bleeding and she agrees to this. - Referral to OB/GYN    Patient discussed with Dr. Kris Mouton, DO [facility] Internal Medicine Resident PGY-2 Clinic Phone: [phone] Pager:  [phone]

## 2023-09-25 LAB — CMP14 + ANION GAP
ALT: 87 [IU]/L — ABNORMAL HIGH (ref 0–32)
AST: 99 [IU]/L — ABNORMAL HIGH (ref 0–40)
Albumin: 3.8 g/dL — ABNORMAL LOW (ref 4.0–5.0)
Alkaline Phosphatase: 103 [IU]/L (ref 44–121)
Anion Gap: 14 mmol/L (ref 10.0–18.0)
BUN/Creatinine Ratio: 4 — ABNORMAL LOW (ref 9–23)
BUN: 4 mg/dL — ABNORMAL LOW (ref 6–20)
Bilirubin Total: 0.6 mg/dL (ref 0.0–1.2)
CO2: 24 mmol/L (ref 20–29)
Calcium: 8.4 mg/dL — ABNORMAL LOW (ref 8.7–10.2)
Chloride: 102 mmol/L (ref 96–106)
Creatinine, Ser: 0.89 mg/dL (ref 0.57–1.00)
Globulin, Total: 3 g/dL (ref 1.5–4.5)
Glucose: 98 mg/dL (ref 70–99)
Potassium: 3.4 mmol/L — ABNORMAL LOW (ref 3.5–5.2)
Sodium: 140 mmol/L (ref 134–144)
Total Protein: 6.8 g/dL (ref 6.0–8.5)
eGFR: 91 mL/min/{1.73_m2} (ref >59)

## 2023-09-25 LAB — CBC WITH DIFFERENTIAL/PLATELET
Basophils Absolute: 0 10*3/uL (ref 0.0–0.2)
Basos: 0 %
EOS (ABSOLUTE): 0 10*3/uL (ref 0.0–0.4)
Eos: 0 %
Hematocrit: 42.9 % (ref 34.0–46.6)
Hemoglobin: 14.1 g/dL (ref 11.1–15.9)
Immature Grans (Abs): 0 10*3/uL (ref 0.0–0.1)
Immature Granulocytes: 1 %
Lymphocytes Absolute: 1.4 10*3/uL (ref 0.7–3.1)
Lymphs: 25 %
MCH: 29.9 pg (ref 26.6–33.0)
MCHC: 32.9 g/dL (ref 31.5–35.7)
MCV: 91 fL (ref 79–97)
Monocytes Absolute: 0.7 10*3/uL (ref 0.1–0.9)
Monocytes: 13 %
Neutrophils Absolute: 3 10*3/uL (ref 1.4–7.0)
Neutrophils: 61 %
Platelets: 123 10*3/uL — ABNORMAL LOW (ref 150–450)
RBC: 4.71 x10E6/uL (ref 3.77–5.28)
RDW: 12.7 % (ref 11.7–15.4)
WBC: 5.1 10*3/uL (ref 3.4–10.8)

## 2023-09-28 ENCOUNTER — Encounter: Payer: MEDICAID | Admitting: Internal Medicine

## 2023-09-28 DIAGNOSIS — N939 Abnormal uterine and vaginal bleeding, unspecified: Secondary | ICD-10-CM | POA: Insufficient documentation

## 2023-09-28 DIAGNOSIS — J101 Influenza due to other identified influenza virus with other respiratory manifestations: Secondary | ICD-10-CM | POA: Insufficient documentation

## 2023-09-28 DIAGNOSIS — N179 Acute kidney failure, unspecified: Secondary | ICD-10-CM | POA: Insufficient documentation

## 2023-09-28 NOTE — Assessment & Plan Note (Signed)
Patient presents for an ED follow-up after being found to be influenza A positive and have possible left lower lobe infiltrate on chest x-ray in the emergency department on 09/19/2023.  Since then she has had continued dyspnea on exertion, cough with clear mucus production, mild abdominal pain with nausea and vomiting as well as diarrhea.  She has noticed some small improvements overall in her symptoms and she has been keeping fluids down well.  At the ED she had an AKI with her creatinine at 1.31 from a baseline of 0.90 as well as a mild transaminitis and hypokalemia.  Repeat labs today show mildly worsening transaminitis and improved potassium of 3.4.  I encouraged her to continue to hydrate well and gave return precautions.  Overall consistent with flu infection and low suspicion for hepatitis. - Continue supportive care at home

## 2023-09-28 NOTE — Assessment & Plan Note (Signed)
Patient presents with abnormally heavy menstrual period over the last 5 days.  Usually her menstrual periods last about 5 days and she uses about 6 pads per day but unfortunately is ran out of pads recently.  She does not know how much she has been using but knows it is definitely heavier than usual.  Usually she has a menstrual period every 1-2 months and her last one was 4 weeks ago.  She denies any new or worsening pain with her menstruation.  Refused pelvic exam today.  Discussed referral to OB/GYN for further evaluation and management of abnormal uterine bleeding and she agrees to this. - Referral to OB/GYN

## 2023-09-28 NOTE — Assessment & Plan Note (Signed)
AKI during ED visit on 09/19/2023 with creatinine bump from baseline of 0.90-1.31.  Metabolic panel today shows creatinine back to baseline of 0.89.

## 2023-10-01 ENCOUNTER — Ambulatory Visit: Payer: MEDICAID | Admitting: Gastroenterology

## 2023-10-05 NOTE — Progress Notes (Signed)
Internal Medicine Clinic Attending  Case discussed with the resident at the time of the visit.  We reviewed the resident's history and exam and pertinent patient test results.  I agree with the assessment, diagnosis, and plan of care documented in the resident's note.  

## 2023-10-13 ENCOUNTER — Encounter: Payer: MEDICAID | Admitting: Internal Medicine

## 2023-11-05 ENCOUNTER — Encounter: Payer: MEDICAID | Admitting: Student

## 2023-12-09 ENCOUNTER — Encounter: Payer: Self-pay | Admitting: Obstetrics and Gynecology

## 2023-12-09 ENCOUNTER — Other Ambulatory Visit: Payer: Self-pay

## 2023-12-09 ENCOUNTER — Ambulatory Visit: Payer: MEDICAID | Admitting: Obstetrics and Gynecology

## 2023-12-09 ENCOUNTER — Other Ambulatory Visit (HOSPITAL_COMMUNITY)
Admission: RE | Admit: 2023-12-09 | Discharge: 2023-12-09 | Disposition: A | Discharge status: Home / Self Care | Payer: MEDICAID | Source: Ambulatory Visit | Attending: Obstetrics and Gynecology | Admitting: Obstetrics and Gynecology

## 2023-12-09 VITALS — BP 117/86 | HR 62 | Wt >= 6400 oz

## 2023-12-09 DIAGNOSIS — N914 Secondary oligomenorrhea: Secondary | ICD-10-CM | POA: Diagnosis not present

## 2023-12-09 DIAGNOSIS — N939 Abnormal uterine and vaginal bleeding, unspecified: Secondary | ICD-10-CM | POA: Diagnosis present

## 2023-12-09 NOTE — Progress Notes (Signed)
 NEW GYNECOLOGY PATIENT Patient name: Kathryn Andrews MRN [id number]  Date of birth: 1996-07-06 Chief Complaint:   Gynecologic Exam     History:  Kathryn Andrews is a 28 y.o. No obstetric history on file. being seen today for irregular menses. Longest stretch of time without a menses for the whole year and then started. No menses for a whole year in 2024 and this has been going on since 2017. No prior workup in to why oligomenorrhea occurs, no new medications started; no new stressors (has a lot of mental health concerns not currently medicated including depression and ADHD and constantly stressed etc per accompanying party). Has had changes in skin (breaking out in arms), change in weight (gaining weight; up and down, 40 lbs more); new hair growth on chin and chest (shaves it). Not particularly heavy when the peirod). No pelvic US done, not currently sexually active .   January mense was normal  Was on birth control pills when younger, no issues, open to birth control [facility] contraindications:   Menses used to be heavy. Menarche at [age] years old. Menses were regular in her teen years     Feel pH balance is off.      Gynecologic History Patient's last menstrual period was 10/21/2023 (approximate). Contraception: abstinence Last Pap: No results found for: "DIAGPAP", "HPVHIGH", "ADEQPAP"  High Risk HPV: Positive  Adequacy:  Satisfactory for evaluation, transformation zone component PRESENT  Diagnosis:  Atypical squamous cells of undetermined significance (ASC-US)  Last Mammogram: n/a Last Colonoscopy: n/a  Obstetric History OB History  No obstetric history on file.    Past Medical History:  Diagnosis Date   Abdominal pain, epigastric 03/08/2021   Anxiety    Asthma    Attention deficit hyperactivity disorder    Bloating 03/08/2021   Chest pain    Chronic constipation    Chronic diarrhea 03/08/2021   Chronic lower back pain 04/02/2020   Class 3 severe obesity without serious  comorbidity with body mass index (BMI) of 60.0 to 69.9 in adult [facility]) 12/15/2021   Depression    Functional dyspepsia 02/28/2020   Generalized abdominal pain 12/15/2021   Irritable bowel syndrome with diarrhea 12/15/2021   Morbid obesity (HCC) 03/02/2020   Urinary frequency 04/02/2020    Past Surgical History:  Procedure Laterality Date   adenoid removal     BIOPSY  08/12/2021   Procedure: BIOPSY;  Surgeon: Lemar Lofty., MD;  Location: [facility];  Service: Gastroenterology;;  EGD and COLON   COLONOSCOPY WITH PROPOFOL N/A 08/12/2021   Procedure: COLONOSCOPY WITH PROPOFOL;  Surgeon: Lemar Lofty., MD;  Location: [facility];  Service: Gastroenterology;  Laterality: N/A;   ESOPHAGOGASTRODUODENOSCOPY (EGD) WITH PROPOFOL N/A 08/12/2021   Procedure: ESOPHAGOGASTRODUODENOSCOPY (EGD) WITH PROPOFOL;  Surgeon: Meridee Score Netty Starring., MD;  Location: [facility];  Service: Gastroenterology;  Laterality: N/A;   EXTERNAL EAR SURGERY     POLYPECTOMY  08/12/2021   Procedure: POLYPECTOMY;  Surgeon: Mansouraty, Netty Starring., MD;  Location: [facility];  Service: Gastroenterology;;   WISDOM TOOTH EXTRACTION      Current Outpatient Medications on File Prior to Visit  Medication Sig Dispense Refill   albuterol (VENTOLIN HFA) 108 (90 Base) MCG/ACT inhaler Inhale 1-2 puffs into the lungs every 6 (six) hours as needed for wheezing or shortness of breath. 18 each 2   FLUoxetine (PROZAC) 20 MG capsule Take 1 capsule (20 mg total) by mouth daily. 30 capsule 1   hydrOXYzine (ATARAX) 10 MG tablet Take 1 tablet (10  mg total) by mouth 3 (three) times daily as needed for anxiety. 30 tablet 0   loratadine (CLARITIN) 10 MG tablet Take 1 tablet (10 mg total) by mouth daily. 90 tablet 0   oseltamivir (TAMIFLU) 75 MG capsule Take 1 capsule (75 mg total) by mouth every 12 (twelve) hours. (Patient not taking: Reported on 12/09/2023) 10 capsule 0   No current facility-administered medications on file prior to  visit.    No Known Allergies  Social History:  reports that she has been smoking cigarettes. She has never used smokeless tobacco. She reports current alcohol use. She reports current drug use. Drug: Marijuana.  Family History  Problem Relation Age of Onset   Diabetes Paternal Grandmother    CAD Paternal Grandmother    Hypertension Paternal Grandmother    Hyperlipidemia Paternal Grandmother    Colon cancer Neg Hx    Esophageal cancer Neg Hx    Inflammatory bowel disease Neg Hx    Liver disease Neg Hx    Pancreatic cancer Neg Hx    Rectal cancer Neg Hx    Stomach cancer Neg Hx     The following portions of the patient's history were reviewed and updated as appropriate: allergies, current medications, past family history, past medical history, past social history, past surgical history and problem list.  Review of Systems Pertinent items noted in HPI and remainder of comprehensive ROS otherwise negative.  Physical Exam:  BP 117/86   Pulse 62   Wt (!) 403 lb 6.4 oz (183 kg)   LMP 10/21/2023 (Approximate)   BMI 61.34 kg/m  Physical Exam Vitals and nursing note reviewed.  Constitutional:      Appearance: Normal appearance.  Cardiovascular:     Rate and Rhythm: Normal rate.  Pulmonary:     Effort: Pulmonary effort is normal.     Breath sounds: Normal breath sounds.  Neurological:     General: No focal deficit present.     Mental Status: She is alert and oriented to person, place, and time.  Psychiatric:        Mood and Affect: Mood normal.        Behavior: Behavior normal.        Thought Content: Thought content normal.        Judgment: Judgment normal.        Assessment and Plan:   1. Abnormal uterine bleeding (AUB) (Primary) 2. Secondary oligomenorrhea AUB workup including pelvic ultrasound to evaluate endometrial thickness. Recommend pap as part of workup, will return to complete pap. Will follow up labs - US PELVIC COMPLETE WITH TRANSVAGINAL; Future - TSH  Rfx on Abnormal to Free T4 - Testosterone,Free and Total - Prolactin - Follicle stimulating hormone - Hemoglobin A1c    Routine preventative health maintenance measures emphasized. Please refer to After Visit Summary for other counseling recommendations.   Follow-up: No follow-ups on file.      Lorriane Shire, MD Obstetrician & Gynecologist, Faculty Practice [facility] for [employer], [facility]

## 2023-12-10 ENCOUNTER — Encounter: Payer: Self-pay | Admitting: Obstetrics and Gynecology

## 2023-12-10 LAB — TSH RFX ON ABNORMAL TO FREE T4: TSH: 1.43 u[IU]/mL (ref 0.450–4.500)

## 2023-12-11 LAB — CERVICOVAGINAL ANCILLARY ONLY
Bacterial Vaginitis (gardnerella): NEGATIVE
Candida Glabrata: NEGATIVE
Candida Vaginitis: NEGATIVE
Comment: NEGATIVE
Comment: NEGATIVE
Comment: NEGATIVE

## 2023-12-14 ENCOUNTER — Encounter: Payer: MEDICAID | Admitting: Student

## 2023-12-14 ENCOUNTER — Other Ambulatory Visit: Payer: Self-pay | Admitting: Obstetrics and Gynecology

## 2023-12-14 DIAGNOSIS — R7989 Other specified abnormal findings of blood chemistry: Secondary | ICD-10-CM

## 2023-12-14 LAB — TESTOSTERONE,FREE AND TOTAL
Testosterone, Free: 2.2 pg/mL (ref 0.0–4.2)
Testosterone: 43 ng/dL (ref 13–71)

## 2023-12-14 LAB — HEMOGLOBIN A1C
Est. average glucose Bld gHb Est-mCnc: 94 mg/dL
Hgb A1c MFr Bld: 4.9 % (ref 4.8–5.6)

## 2023-12-14 LAB — FOLLICLE STIMULATING HORMONE: FSH: <0.3 m[IU]/mL

## 2023-12-14 LAB — PROLACTIN: Prolactin: 126 ng/mL — ABNORMAL HIGH (ref 4.8–33.4)

## 2023-12-17 ENCOUNTER — Telehealth: Payer: Self-pay

## 2023-12-17 NOTE — Telephone Encounter (Signed)
-----   Message from Elizabeth sent at 12/14/2023  1:07 PM EDT ----- Elevated prolactin, needs brain mri

## 2023-12-17 NOTE — Telephone Encounter (Signed)
 Called PT to go over elevated Prolactin level results & that Dr. Briscoe Deutscher recommended for her to get a Brain MRI, which has been scheduled for 12/28/23 @ 4:30p. Pt verbalized understanding.

## 2023-12-18 ENCOUNTER — Encounter: Payer: MEDICAID | Admitting: Student

## 2023-12-22 ENCOUNTER — Ambulatory Visit: Payer: Self-pay | Admitting: Internal Medicine

## 2023-12-22 ENCOUNTER — Other Ambulatory Visit: Payer: Self-pay | Admitting: Student

## 2023-12-22 DIAGNOSIS — J309 Allergic rhinitis, unspecified: Secondary | ICD-10-CM

## 2023-12-22 NOTE — Telephone Encounter (Addendum)
 Chief Complaint: Information only  Disposition: [] ED /[] Urgent Care (no appt availability in office) / [] Appointment(In office/virtual)/ []  [place] [facility]/ [] Home Care/ [] Refused Recommended Disposition /[] [facility]/ [x]  Follow-up with PCP  Additional Notes: Patient's grandmother, [name] on [place], calls with concerns regarding patient's weight and the possibility of a referral to a dietician and weight loss injections. She also states that she would like to have her connected with mental health provider again. Caller states that she feels patient will be more receptive to PCP recommendations than hers. Reports she used to a mental health provider, but has not in some time. States the patient is safe and is not expressing SI/HI, she just feels she needs someone to talk to. She is stating she prefers patient be seen face to face for all appointments to get her out of the house. She expresses concerns about her health and is requesting the provider discuss it with her discreetly tomorrow during OV so the patient is not upset with caller. Alerting PCP for review.   This RN attempted to contact patient's grandmother, [name] on [place], for triage. No answer, voicemail left requesting return call to clinic.  Copied from CRM [id number]. Topic: Clinical - Medical Advice >> Dec 22, 2023  1:27 PM Kathryn Andrews wrote: Reason for CRM: Caller: Patient's grandmother  Reason for call: Patient's grandmother, Kathryn Andrews, called in to request to speak with her granddaughter's PCP due to the patient's current mental state and the grandmother's  concerns.   She stated that she has gained some weight, she has some issues with her standing, her heart and her back. She stated that she is refusing to eat healthy foods such as vegatables. There has been discussion of the patient possibly starting to go to the gym with a close friend of the patient.    The patient's grandmother stated that she is in need of  invention when it comes to her health. She stated that the patient seems to have the mental state of a 30 year old. She stated she feels the patient does need a possible psychiatric evalution. The grandmother also stated she believes the continued recommendation for an in-person visit as opposed to a video visit would be better so that the  PCP can better monitor her health.   The grandmother stated she is experiencing some concerns regarding the patient being verbally abusive. (During the call the patient entered the room and the two began shouting at one another and it was unclear what the patient was stating but patient stated she spoke with someone from the office this morning 12/22/23 toward the end of the conversation.) The grandmother did quickly end the call after she realized her granddaughter was in the room.   Please follow up using the number provided.     Symptoms:  Increased verbal abuse Not consuming any vegatables or healthy foods No excercise Finds it hard to stand indenpendently    Phone number to call:  [phone]

## 2023-12-22 NOTE — Telephone Encounter (Signed)
 Reason for Disposition . [1] Caller requesting NON-URGENT health information AND [2] PCP's office is the best resource  Answer Assessment - Initial Assessment Questions 1. REASON FOR CALL or QUESTION: "What is your reason for calling today?" or "How can I best help you?" or "What question do you have that I can help answer?"     Patient's grandmother, [name] on [place], called with concerns over patient's weight.  Protocols used: Information Only Call - No Triage-A-AH

## 2023-12-22 NOTE — Telephone Encounter (Signed)
 Medication sent to pharmacy

## 2023-12-23 ENCOUNTER — Ambulatory Visit (HOSPITAL_COMMUNITY)
Admission: RE | Admit: 2023-12-23 | Discharge: 2023-12-23 | Disposition: A | Discharge status: Home / Self Care | Payer: MEDICAID | Source: Ambulatory Visit | Attending: Obstetrics and Gynecology | Admitting: Obstetrics and Gynecology

## 2023-12-23 ENCOUNTER — Other Ambulatory Visit: Payer: Self-pay | Admitting: Obstetrics and Gynecology

## 2023-12-23 DIAGNOSIS — N939 Abnormal uterine and vaginal bleeding, unspecified: Secondary | ICD-10-CM

## 2023-12-23 DIAGNOSIS — N914 Secondary oligomenorrhea: Secondary | ICD-10-CM | POA: Diagnosis present

## 2023-12-25 ENCOUNTER — Ambulatory Visit: Payer: MEDICAID | Admitting: Gastroenterology

## 2023-12-25 ENCOUNTER — Encounter: Payer: MEDICAID | Admitting: Student

## 2023-12-28 ENCOUNTER — Ambulatory Visit (HOSPITAL_COMMUNITY): Payer: MEDICAID

## 2023-12-30 ENCOUNTER — Ambulatory Visit (INDEPENDENT_AMBULATORY_CARE_PROVIDER_SITE_OTHER): Payer: MEDICAID | Admitting: Student

## 2023-12-30 DIAGNOSIS — J309 Allergic rhinitis, unspecified: Secondary | ICD-10-CM | POA: Diagnosis not present

## 2023-12-30 DIAGNOSIS — F419 Anxiety disorder, unspecified: Secondary | ICD-10-CM | POA: Diagnosis not present

## 2023-12-30 DIAGNOSIS — F32A Depression, unspecified: Secondary | ICD-10-CM | POA: Diagnosis not present

## 2023-12-30 MED ORDER — OLOPATADINE HCL 0.1 % OP SOLN: 1.0000 [drp] | Freq: Two times a day (BID) | OPHTHALMIC | 12 refills | Status: DC | PRN

## 2023-12-30 MED ORDER — FLUOXETINE HCL 20 MG PO CAPS: 20.0000 mg | ORAL_CAPSULE | Freq: Every day | ORAL | 3 refills | Status: DC

## 2023-12-30 MED ORDER — LORATADINE 10 MG PO TABS: 10.0000 mg | ORAL_TABLET | Freq: Every day | ORAL | 0 refills | Status: AC

## 2023-12-30 MED ORDER — ALBUTEROL SULFATE HFA 108 (90 BASE) MCG/ACT IN AERS
1.0000 | INHALATION_SPRAY | Freq: Four times a day (QID) | RESPIRATORY_TRACT | 2 refills | Status: AC | PRN
Start: 2023-12-30 — End: ?

## 2023-12-30 NOTE — Patient Instructions (Addendum)
 Thank you so much for coming to the clinic today!   I have sent in a refill for your Prozac and allergy medications.   I have also sent in a referral to [facility] for behavioral health counseling.   We will see you in 3 months.   If you have any questions please feel free to the call the clinic at anytime at [phone]. It was a pleasure seeing you!  Best, Dr. Rayvon Char

## 2023-12-30 NOTE — Assessment & Plan Note (Signed)
 Patient is currently prescribed Prozac 20 mg/day.  She lost her bottle and has been unable to take this medication for the past month.  She notes alleviation of her depression and anxiety symptoms while taking medication.  Today, she had a PHQ-9 of 16 and GAD-7 of 19.  She denies any SI or HI.  Patient is amenable to a behavioral health referral, so this was sent today as well. - Continue Prozac 20 mg daily - Referral to integrated behavioral health

## 2023-12-30 NOTE — Progress Notes (Signed)
 CC: Depression and anxiety  HPI: Kathryn Andrews is a 28 y.o. female living with a history stated below and presents today for depression and anxiety. Please see problem based assessment and plan for additional details.  Past Medical History:  Diagnosis Date   Abdominal pain, epigastric 03/08/2021   Anxiety    Asthma    Attention deficit hyperactivity disorder    Bloating 03/08/2021   Chest pain    Chronic constipation    Chronic diarrhea 03/08/2021   Chronic lower back pain 04/02/2020   Class 3 severe obesity without serious comorbidity with body mass index (BMI) of 60.0 to 69.9 in adult [facility]) 12/15/2021   Depression    Functional dyspepsia 02/28/2020   Generalized abdominal pain 12/15/2021   Irritable bowel syndrome with diarrhea 12/15/2021   Morbid obesity (HCC) 03/02/2020   Urinary frequency 04/02/2020    Current Outpatient Medications on File Prior to Visit  Medication Sig Dispense Refill   hydrOXYzine (ATARAX) 10 MG tablet Take 1 tablet (10 mg total) by mouth 3 (three) times daily as needed for anxiety. 30 tablet 0   oseltamivir (TAMIFLU) 75 MG capsule Take 1 capsule (75 mg total) by mouth every 12 (twelve) hours. (Patient not taking: Reported on 12/09/2023) 10 capsule 0   No current facility-administered medications on file prior to visit.    Family History  Problem Relation Age of Onset   Diabetes Paternal Grandmother    CAD Paternal Grandmother    Hypertension Paternal Grandmother    Hyperlipidemia Paternal Grandmother    Colon cancer Neg Hx    Esophageal cancer Neg Hx    Inflammatory bowel disease Neg Hx    Liver disease Neg Hx    Pancreatic cancer Neg Hx    Rectal cancer Neg Hx    Stomach cancer Neg Hx     Social History   Socioeconomic History   Marital status: Single    Spouse name: Not on file   Number of children: Not on file   Years of education: Not on file   Highest education level: Not on file  Occupational History   Not on file  Tobacco Use    Smoking status: Every Day    Current packs/day: 0.25    Types: Cigarettes   Smokeless tobacco: Never   Tobacco comments:    Vapes   Vaping Use   Vaping status: Never Used  Substance and Sexual Activity   Alcohol use: Yes    Comment: occasionally   Drug use: Yes    Types: Marijuana   Sexual activity: Not on file  Other Topics Concern   Not on file  Social History Narrative   Not on file   Social Drivers of Health   Financial Resource Strain: Not on file  Food Insecurity: No Food Insecurity (10/16/2022)   Hunger Vital Sign    Worried About Running Out of Food in the Last Year: Never true    Ran Out of Food in the Last Year: Never true  Transportation Needs: No Transportation Needs (10/16/2022)   PRAPARE - Administrator, Civil Service (Medical): No    Lack of Transportation (Non-Medical): No  Physical Activity: Not on file  Stress: Not on file  Social Connections: Not on file  Intimate Partner Violence: Not on file    Review of Systems: ROS negative except for what is noted on the assessment and plan.  Vitals:   12/30/23 1314  BP: 135/75  Pulse: 60  Temp: Marland Kitchen)  97.4 F (36.3 C)  TempSrc: Oral  SpO2: 100%  Weight: (!) 409 lb (185.5 kg)  Height: 5\' 8"  (1.727 m)    Physical Exam: Constitutional: well-appearing in no acute distress HENT: normocephalic atraumatic, mucous membranes moist Eyes: conjunctiva non-erythematous Neck: supple Cardiovascular: regular rate and rhythm, no m/r/g Pulmonary/Chest: normal work of breathing on room air, lungs clear to auscultation bilaterally Abdominal: soft, non-tender, non-distended MSK: normal bulk and tone Neurological: alert & oriented x 3, 5/5 strength in bilateral upper and lower extremities, normal gait Skin: warm and dry  Assessment & Plan:   Anxiety and depression Patient is currently prescribed Prozac 20 mg/day.  She lost her bottle and has been unable to take this medication for the past month.  She notes  alleviation of her depression and anxiety symptoms while taking medication.  Today, she had a PHQ-9 of 16 and GAD-7 of 19.  She denies any SI or HI.  Patient is amenable to a behavioral health referral, so this was sent today as well. - Continue Prozac 20 mg daily - Referral to integrated behavioral health  Allergic rhinitis Sent in refills for her loratadine 10 mg daily.  Will also resume Pataday 1 drop into her eyes twice daily as needed for relief. - Continue loratadine 10 mg daily - Resume Pataday 1 drop in both eyes twice daily as needed  Patient discussed with Dr. Mariea Stable, MD  [facility] Internal Medicine, PGY-1 Date 12/30/2023 Time 1:42 PM

## 2023-12-30 NOTE — Assessment & Plan Note (Signed)
 Sent in refills for her loratadine 10 mg daily.  Will also resume Pataday 1 drop into her eyes twice daily as needed for relief. - Continue loratadine 10 mg daily - Resume Pataday 1 drop in both eyes twice daily as needed

## 2023-12-31 ENCOUNTER — Ambulatory Visit (HOSPITAL_COMMUNITY): Payer: MEDICAID

## 2024-01-04 NOTE — Progress Notes (Signed)
 Internal Medicine Clinic Attending  Case discussed with the resident at the time of the visit.  We reviewed the resident's history and exam and pertinent patient test results.  I agree with the assessment, diagnosis, and plan of care documented in the resident's note.

## 2024-01-04 NOTE — Addendum Note (Signed)
 Addended by: Dickie La on: 01/04/2024 10:46 AM   Modules accepted: Level of Service

## 2024-01-07 ENCOUNTER — Encounter: Payer: Self-pay | Admitting: Obstetrics and Gynecology

## 2024-01-08 ENCOUNTER — Telehealth: Payer: Self-pay | Admitting: Obstetrics and Gynecology

## 2024-01-08 NOTE — Telephone Encounter (Signed)
 Called peer to peer line, reviewing provider does not have [place] license and so message sent to reviewers with [place] license and notation that there is documented elevated prolactin level and therefore brain MRI should be approved. Will need to await response from new reviewers but ensured it should be approved.

## 2024-01-08 NOTE — Telephone Encounter (Signed)
-----   Message from Raquel James sent at 01/07/2024  1:56 PM EDT ----- Regarding: RE: needs prior auth for MRI 12/28/23 [facility] / [employer] is requesting a Peer-to-Peer for this Pt's MRI procedure (CPT code 16109, Dx code R79.89).  The P2P call number: [phone] Ref.#[id number]  Please advise me of the results. Thank you. ----- Message ----- From: Gerome Apley, RN Sent: 12/23/2023   1:57 PM EDT To: Oleh Genin Subject: needs prior auth for MRI 12/28/23

## 2024-01-13 ENCOUNTER — Institutional Professional Consult (permissible substitution): Payer: MEDICAID | Admitting: Licensed Clinical Social Worker

## 2024-01-13 ENCOUNTER — Telehealth (INDEPENDENT_AMBULATORY_CARE_PROVIDER_SITE_OTHER): Payer: MEDICAID | Admitting: Licensed Clinical Social Worker

## 2024-01-13 DIAGNOSIS — F32A Depression, unspecified: Secondary | ICD-10-CM

## 2024-01-13 DIAGNOSIS — F419 Anxiety disorder, unspecified: Secondary | ICD-10-CM

## 2024-01-13 NOTE — Telephone Encounter (Signed)
 [facility] spoke with patient on today at 1:35 pm and confirmed patient would arrive to in-person appointment at 2:30 Pm.   Christen Butter, MSW, LCSW-A She/Her Behavioral Health Clinician [facility]  Internal Medicine Center Direct Dial:[phone]  Fax [phone] Main Office Phone: [phone] [address] [place] [place], [place] [address] Website: [facility] Internal Medicine [facility]  [place], [place]  [place]

## 2024-01-14 ENCOUNTER — Telehealth: Payer: Self-pay | Admitting: Internal Medicine

## 2024-01-14 NOTE — Telephone Encounter (Signed)
 Called and Spoke with the pt and her Caregiver whose name is not Reuel Boom.  The Aide's name is Duwayne Heck.  Per Duwayne Heck they did come to the right entrance but told them they were to be at [facility].   They both have now voiced understanding to come the the Internal Medicine office for the resch appointment  with Sharolyn Douglas on:     Name: Asaiah, [place] MRN: [id number]  Date: 01/20/2024 Status: Sch  Time: 2:00 PM Length: 60  Visit Type: INTEGRATED BH CONSULT 60 [1647] Copay: $0.00  Provider: Rich Reining         Copied from CRM [id number]. Topic: Appointments - Scheduling Inquiry for Clinic >> Jan 13, 2024  3:15 PM Hamdi H wrote: Reason for CRM: Patient missed her behavioral health appointment that was scheduled for 01/13/24 due to not being able to find the correct entrance. Please give Reuel Boom the patients aide a call back to reschedule this appointment at [phone].

## 2024-01-14 NOTE — Telephone Encounter (Signed)
 Please see previous message:   All Conversations (Newest Message First)Me  CB   01/14/24  9:48 AM Note Called and Spoke with the pt and her Caregiver whose name is not Reuel Boom.  The Aide's name is Duwayne Heck.  Per Duwayne Heck they did come to the right entrance but told them they were to be at [facility].   They both have now voiced understanding to come the the Internal Medicine office for the resch appointment  with Sharolyn Douglas on:       Name: Kathryn, Andrews MRN: [id number]  Date: 01/20/2024 Status: Sch  Time: 2:00 PM Length: 60  Visit Type: INTEGRATED BH CONSULT 60 [1647] Copay: $0.00  Provider: Rich Reining                Copied from CRM [id number]. Topic: Appointments - Scheduling Inquiry for Clinic >> Jan 13, 2024  3:15 PM Hamdi H wrote: Reason for CRM: Patient missed her behavioral health appointment that was scheduled for 01/13/24 due to not being able to find the correct entrance. Please give Reuel Boom the patients aide a call back to reschedule this appointment at [phone].            Copied from CRM [id number]. Topic: General - Running Late >> Jan 13, 2024  2:28 PM Brittney F wrote: Patient/patient representative is calling because they are running late for an appointment.   Patient's aide called in stating that they were at [address] [facility]; Patient's aide was given the correct address of [address] and will be arriving shortly.

## 2024-01-20 ENCOUNTER — Ambulatory Visit: Payer: MEDICAID | Admitting: Licensed Clinical Social Worker

## 2024-01-20 DIAGNOSIS — F419 Anxiety disorder, unspecified: Secondary | ICD-10-CM

## 2024-01-20 DIAGNOSIS — F32A Depression, unspecified: Secondary | ICD-10-CM | POA: Diagnosis not present

## 2024-01-20 NOTE — BH Specialist Note (Signed)
 Integrated Behavioral Health Follow Up In-Person Visit  MRN: [id number] Name: Kathryn Andrews  Number of Integrated Behavioral Health Clinician visits: Additional Visit  Session Start time: 1345   Session End time: 1445  Total time in minutes: 60   Types of Service: Individual psychotherapy and Health & Behavioral Assessment/Intervention  Interpretor:No. Interpretor Name and Language: N/A  Subjective: Kathryn Andrews is a 28 y.o. female  Patient was referred by PCP for Anxiety/ Depression/ ADHD. Patient reports the following symptoms/concerns: The client attended a 60-minute psychotherapy session today. The client presents with a diagnosis of ADHD and anxiety, and they report ongoing symptoms of depression, dissociation, and emotional distress. The client has been receiving SSD benefits of $800.00 per month since the age of 28 or 58. They currently live with their grandmother and report that the relationship is sometimes good, though it often causes stress. Two siblings also live in the home, but the client does not maintain communication with them, contributing to feelings of isolation. The client identified their living situation as a major stressor and expressed a desire to move out and live independently but is currently limited due to a lack of transportation and financial independence.  The client reports long-standing depression and continues to experience frequent crying spells. They are very close to their best friend, Cary Clarks, whom they grew up with and talk to daily. Cary Clarks has been a significant source of emotional support, especially after the death of her mother, who also supported the client. Additionally, the client finds comfort in daily phone calls from Martha's daughter, who is their goddaughter.  The client described symptoms of dissociation and difficulty managing emotional regulation, especially when feeling "stuck" at their grandmother's home. They identified being a  caregiver and having no transportation as major triggers for their anxiety and depressive symptoms. The client also expressed a strong desire to work but is currently unemployed due to transportation barriers.  The client has three cats--Simba, Cow (a black and white cat), and Sunny--and one dog named Harley. These pets appear to be a source of emotional support and comfort. The client reported they recently began a new medication regimen two weeks ago, targeting symptoms of anxiety, ADHD, and depression. They stated that the medication for anxiety is beginning to help and expressed interest in continuing and possibly adjusting medications to better address symptoms of ADHD and depression.  The therapeutic focus of the session was to validate the client's emotional experiences, explore sources of distress and support, and identify practical steps toward the client's goal of reducing symptoms of depression and anxiety. We discussed small steps toward autonomy, such as developing a realistic plan for eventual independent living and exploring options for transportation and employment supports in the future. Coping strategies for dissociation and emotional overwhelm were also introduced, including grounding techniques and mindfulness exercises.  The client was engaged and responsive throughout the session. They identified their primary treatment goal as reducing depression and anxiety. They are motivated to continue therapy and medication management to support their emotional and functional stability.  The next session is scheduled for 05/19 at 1:30 pm, in person. Duration of problem: Several Years; Severity of problem: moderate  Objective: Mood: Anxious and Depressed and Affect: Appropriate Risk of harm to self or others: No plan to harm self or others  Life Context: Family and Social: Has a best friend and lives with Surveyor, minerals. School/Work: Not in school and unemployed Self-Care: Patient enjoys  music Life Changes: Client reported no changes  Patient and/or Family's  Strengths/Protective Factors: Social and Emotional competence and Sense of purpose  Goals Addressed: Patient will:  Reduce symptoms of: anxiety and depression    Progress towards Goals: Ongoing  Interventions: Interventions utilized:  CBT Cognitive Behavioral Therapy Standardized Assessments completed: PHQ-SADS    01/20/2024    1:59 PM 12/09/2023    5:43 PM 12/09/2023    5:42 PM  PHQ-SADS Last 3 Score only  Total GAD-7 Score  20   PHQ Adolescent Score 9  20     Patient and/or Family Response: Patient agrees to ongoing services with [facility].  Patient Centered Plan: Patient is on the following Treatment Plan(s): Ongoing OPT once monthly Assessment: Patient currently experiencing Depression/ Anxiety/ ADHD.   Patient may benefit from OPT.  Plan: Follow up with behavioral health clinician on : 02/22/2024 at 1:30 pm;In person  Amie Bald, MSW, LCSW-A She/Her Behavioral Health Clinician [facility]  Internal Medicine Center Direct [phone]  Fax [phone] Main Office Phone: [phone] [address] [place] [place], [place] [address] Website: [facility] Internal Medicine [facility]  [place], [place]  [place]

## 2024-01-21 ENCOUNTER — Ambulatory Visit: Payer: MEDICAID | Admitting: Obstetrics and Gynecology

## 2024-02-01 ENCOUNTER — Telehealth: Payer: Self-pay | Admitting: Family Medicine

## 2024-02-01 NOTE — Telephone Encounter (Signed)
 Danielle which is the patient aide called in regarding the patient MRI being scheduled. Informed  patient aide I will have someone call her.

## 2024-02-01 NOTE — Telephone Encounter (Signed)
 Called and spoke with Kathryn Andrews per number provided below.    Kathryn Andrews will bring in the jury summons letter completed and leave it at the front desk for the provider to assist with a "excuse from jury duty letter".     Copied from CRM [id number]. Topic: General - Other >> Feb 01, 2024 10:50 AM Kathryn Andrews wrote: Reason for CRM: Kathryn Andrews, aide of patient, called stating that the patient received a letter for jury summons in the mail. She's wanting counselor, Kathryn Andrews, to write a letter stating that the patient is not medically fit to be on trial/jury duty. For further questions or concerns, please give a call to patient's grandmother, Kathryn Andrews at [phone] or aide, Kathryn Andrews, at [phone].

## 2024-02-03 ENCOUNTER — Ambulatory Visit: Payer: MEDICAID | Admitting: Obstetrics and Gynecology

## 2024-02-03 ENCOUNTER — Other Ambulatory Visit (HOSPITAL_COMMUNITY)
Admission: RE | Admit: 2024-02-03 | Discharge: 2024-02-03 | Disposition: A | Discharge status: Home / Self Care | Payer: MEDICAID | Source: Ambulatory Visit | Attending: Obstetrics and Gynecology | Admitting: Obstetrics and Gynecology

## 2024-02-03 ENCOUNTER — Encounter: Payer: Self-pay | Admitting: Obstetrics and Gynecology

## 2024-02-03 ENCOUNTER — Other Ambulatory Visit: Payer: Self-pay

## 2024-02-03 VITALS — BP 139/79 | HR 52

## 2024-02-03 DIAGNOSIS — Z01419 Encounter for gynecological examination (general) (routine) without abnormal findings: Secondary | ICD-10-CM | POA: Diagnosis present

## 2024-02-03 DIAGNOSIS — Z124 Encounter for screening for malignant neoplasm of cervix: Secondary | ICD-10-CM

## 2024-02-03 DIAGNOSIS — N914 Secondary oligomenorrhea: Secondary | ICD-10-CM | POA: Diagnosis not present

## 2024-02-03 NOTE — Progress Notes (Signed)
 ANNUAL EXAM Patient name: Kathryn Andrews MRN [id number]  Date of birth: 1996-06-09 Chief Complaint:   No chief complaint on file.  History of Present Illness:   Kathryn Andrews is a 28 y.o. No obstetric history on file. being seen today for a routine annual exam.  Current complaints: pap smear  Irregular menses. Has not had MRI due to initially not being approved but it has been approved and waiting to schedule.   No LMP recorded. (Menstrual status: Irregular Periods).   The pregnancy intention screening data noted above was reviewed. Potential methods of contraception were discussed. The patient elected to proceed with No data recorded.   Last pap No results found for: "DIAGPAP", "HPVHIGH", "ADEQPAP" Last mammogram: n/a.  Last colonoscopy: n/a.      01/20/2024    1:59 PM 12/09/2023    5:42 PM 04/13/2023    4:25 PM 02/05/2023    2:38 PM 10/16/2022    5:14 PM  Depression screen PHQ 2/9  Decreased Interest 2 3 2 2 1   Down, Depressed, Hopeless 0 2 2 3 2   PHQ - 2 Score 2 5 4 5 3   Altered sleeping 1 3 3  0 3  Tired, decreased energy 2 2 2 1 1   Change in appetite 2 3 3 2 3   Feeling bad or failure about yourself  1 2 3 2 3   Trouble concentrating 1 3 2 3 2   Moving slowly or fidgety/restless 0 2 1 1 1   Suicidal thoughts 0 1 1 0 0  PHQ-9 Score 9 21 19 14 16   Difficult doing work/chores   Very difficult Somewhat difficult Somewhat difficult        12/09/2023    5:43 PM 09/16/2022    4:25 PM 09/16/2022    3:55 PM 06/13/2022   11:48 AM  GAD 7 : Generalized Anxiety Score  Nervous, Anxious, on Edge 3  2 1   Control/stop worrying 3  2 2   Worry too much - different things 3  1 2   Trouble relaxing 3  2 0  Restless 3  2 1   Easily annoyed or irritable 3  1 3   Afraid - awful might happen 2  2 2   Total GAD 7 Score 20  12 11   Anxiety Difficulty  Somewhat difficult  Very difficult     Review of Systems:   Pertinent items are noted in HPI Denies any headaches, blurred vision, fatigue,  shortness of breath, chest pain, abdominal pain, abnormal vaginal discharge/itching/odor/irritation, problems with periods, bowel movements, urination, or intercourse unless otherwise stated above. Pertinent History Reviewed:  Reviewed past medical,surgical, social and family history.  Reviewed problem list, medications and allergies. Physical Assessment:   Vitals:   02/03/24 1102  BP: 139/79  Pulse: (!) 52  There is no height or weight on file to calculate BMI.        Physical Examination:   General appearance - well appearing, and in no distress  Mental status - alert, oriented to person, place, and time  Psych:  She has a normal mood and affect  Skin - warm and dry, normal color, no suspicious lesions noted  Chest - effort normal, all lung fields clear to auscultation bilaterally  Heart - normal rate and regular rhythm  Abdomen - soft, nontender, nondistended, no masses or organomegaly  Pelvic -  VULVA: normal appearing vulva with no masses, tenderness or lesions   VAGINA: normal appearing vagina with normal color and discharge, no lesions   CERVIX: not visualized  well, blind pap collected  Thin prep pap is done with reflex HR HPV cotesting  UTERUS: uterus is felt to be normal size, shape, consistency and nontender   ADNEXA: No adnexal masses or tenderness noted.  Extremities:  No swelling or varicosities noted  Chaperone present for exam  No results found for this or any previous visit (from the past 24 hours).    Assessment & Plan:   1. Well woman exam with routine gynecological exam (Primary) - Cervical cancer screening: Discussed screening Q3 years. Reviewed importance of annual exams and limits of pap smear. Pap with reflex HPV collected - GC/CT: Discussed and recommended. Pt  accepts - Birth Control:  abstinence - Breast Health: Encouraged self breast awareness/exams.  - Follow-up: 12 months and prn  - Cytology - PAP( [place])  2. Screening for cervical  cancer - Cytology - PAP( [place])  3. Secondary oligomenorrhea Elevated prolactin with work up, will complete brain MRI to assess for prolactinoma   No orders of the defined types were placed in this encounter.   Meds: No orders of the defined types were placed in this encounter.   Follow-up: No follow-ups on file.  Kiki Pelton, MD 02/03/2024 10:52 AM

## 2024-02-04 NOTE — Telephone Encounter (Signed)
 Returned call to [place], she was not there. Spoke with Roniqua's grandmother. They wanted to know about when MRI is scheduled informed of date, time and location of appointment.

## 2024-02-05 LAB — CYTOLOGY - PAP
Adequacy: ABSENT
Chlamydia: NEGATIVE
Comment: NEGATIVE
Comment: NEGATIVE
Comment: NORMAL
Diagnosis: NEGATIVE
Neisseria Gonorrhea: NEGATIVE
Trichomonas: NEGATIVE

## 2024-02-08 ENCOUNTER — Encounter: Payer: Self-pay | Admitting: Obstetrics and Gynecology

## 2024-02-10 ENCOUNTER — Telehealth: Payer: Self-pay | Admitting: Family Medicine

## 2024-02-10 ENCOUNTER — Ambulatory Visit (HOSPITAL_COMMUNITY): Payer: MEDICAID

## 2024-02-10 NOTE — Telephone Encounter (Signed)
 Patient is calling because she has been back and forth with her insurance about getting an MRI done requested by Dr.Ajewole. Insurance has denied her getting an MRI done today at her appt out of office because she did not have the appointment scheduled within a certain time. Please call her and keep her updated if she does not answer please reach out to her grandmother so that they know when they can get the MRI rescheduled.

## 2024-02-10 NOTE — Telephone Encounter (Signed)
 Spoke with pt and pt home health aide, Danielle, regarding issues with insurance and getting MRI scheduled. Pt home health aide stated that the prior authorization expired on April 23rd and MRI was scheduled for May 7th so it had to be canceled because it would not be covered. Pt and Pt home health aide informed that we would reach out to get the prior authorization and then would schedule MRI. Pt home health aide requested that the MRI be scheduled for a Wednesday. Home health aide informed that this would take a few days and the appointment would be visible in MyChart once scheduled. Home health aide voiced understanding.   Dino Frank, Charity fundraiser

## 2024-02-22 ENCOUNTER — Ambulatory Visit: Payer: MEDICAID | Admitting: Licensed Clinical Social Worker

## 2024-02-22 DIAGNOSIS — F419 Anxiety disorder, unspecified: Secondary | ICD-10-CM | POA: Diagnosis not present

## 2024-02-22 DIAGNOSIS — F32A Depression, unspecified: Secondary | ICD-10-CM | POA: Diagnosis not present

## 2024-02-22 NOTE — BH Specialist Note (Signed)
 Integrated Behavioral Health via Telemedicine Visit  02/22/2024 Kathryn Andrews [id number]  Number of Integrated Behavioral Health Clinician visits: Additional Visit  Session Start time: 1330   Session End time: 1400  Total time in minutes: 30   Referring Provider: PCP Patient/Family location: Home [facility] Provider location: [facility] All persons participating in visit: [facility] and Patient Types of Service: Individual psychotherapy and General Behavioral Integrated Care (BHI)  I connected with Kathryn Andrews  via Video Enabled Telemedicine Application  (Video is Caregility application) and verified that I am speaking with the correct person using two identifiers. Discussed confidentiality: Yes   I discussed the limitations of telemedicine and the availability of in person appointments.  Discussed there is a possibility of technology failure and discussed alternative modes of communication if that failure occurs.  I discussed that engaging in this telemedicine visit, they consent to the provision of behavioral healthcare and the services will be billed under their insurance.  Patient and/or legal guardian expressed understanding and consented to Telemedicine visit: Yes   Presenting Concerns: Patient and/or family reports the following symptoms/concerns: LCSW-A conducted a scheduled telehealth session with the patient via a HIPAA-compliant video platform on today's date. The patient presented on time and engaged cooperatively, though her demeanor appeared flat. 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 The patient described an improved living situation with her grandmother, noting the support of an in-home nursing aide as a helpful change.  The patient endorsed a decrease in depression and anxiety symptoms, attributing much of  her emotional improvement to spending more time outdoors in her yard. Financially, she reported stability and noted that she has been saving money. The patient disclosed she has a pending CT scan related to concerns about her menstrual cycle. She also shared plans to babysit her godchildren over the weekend.  During the session, the patient acknowledged ongoing struggles with low motivation. She noted that music serves as a motivator and that her dissatisfaction with her physical appearance--specifically when looking in the mirror--pushes her toward self-improvement. The patient declined interest in psychiatric or therapeutic services at this time, expressing a preference for managing her mental health independently.  Together, the patient and [facility] identified three new personal goals to promote wellness and continued progress: (1) increasing social engagement by reaching out to her friend Kathryn Andrews to make plans for bi-monthly outings such as getting nails done or dining out, (2) improving physical health through better eating habits and a focus on fitness and weight loss, and (3) restructuring her sleep pattern to align with nighttime rest and daytime activity.  The patient agreed to a follow-up appointment scheduled for 06/19 at 10:15 AM via video session. Duration of problem: more than one year; Severity of problem: mild  Patient and/or Family's Strengths/Protective Factors: Sense of purpose  Goals Addressed: Patient will:  Reduce symptoms of: anxiety, depression, and decreased motivation    Progress towards Goals: Ongoing  Interventions: Interventions utilized:  Motivational Interviewing and Supportive Counseling Standardized Assessments completed: Patient declined screening  Patient and/or Family Response: Patient agrees to OPT  Assessment: Patient currently experiencing Lack of motivation.   Patient may benefit from Ongoing OPT.  Plan: Follow up with behavioral health clinician on :  03/24/2024 via Video/Telehealth @ 10:15 AM  I discussed the assessment and treatment plan with the patient and/or parent/guardian. They were provided an  opportunity to ask questions and all were answered. They agreed with the plan and demonstrated an understanding of the instructions.   They were advised to call back or seek an in-person evaluation if the symptoms worsen or if the condition fails to improve as anticipated.  Kathryn Andrews

## 2024-02-22 NOTE — Telephone Encounter (Signed)
 Spoke with the patient and verified a Video Visit is needed for today's appt due to transportation  Copied from CRM [id number]. Topic: Appointments - Appointment Info/Confirmation >> Feb 22, 2024 10:27 AM Madelyne Schiff wrote: Diego Foy the aid called, due to patient not having transportation asked for her appt to be changed to virtual/telephone. Telford Feather for her appointment to be changed to virtual/telephone.

## 2024-02-23 NOTE — Progress Notes (Signed)
 CC: psychiatry referral  HPI:  [name] is a 28 y.o. with medical history of class III obesity, MDD, GAD and IBS presenting to [facility] for MDD follow up and referral to psychiatry.   Please see problem-based list for further details, assessments, and plans.  Past Medical History:  Diagnosis Date   Abdominal pain, epigastric 03/08/2021   Anxiety    Asthma    Attention deficit hyperactivity disorder    Bloating 03/08/2021   Chest pain    Chronic constipation    Chronic diarrhea 03/08/2021   Chronic lower back pain 04/02/2020   Class 3 severe obesity without serious comorbidity with body mass index (BMI) of 60.0 to 69.9 in adult 12/15/2021   Depression    Functional dyspepsia 02/28/2020   Generalized abdominal pain 12/15/2021   Irritable bowel syndrome with diarrhea 12/15/2021   Morbid obesity (HCC) 03/02/2020   Urinary frequency 04/02/2020      Current Outpatient Medications (Respiratory):    albuterol  (VENTOLIN  HFA) 108 (90 Base) MCG/ACT inhaler, Inhale 1-2 puffs into the lungs every 6 (six) hours as needed for wheezing or shortness of breath.   loratadine  (CLARITIN ) 10 MG tablet, Take 1 tablet (10 mg total) by mouth daily.    Current Outpatient Medications (Other):    FLUoxetine  (PROZAC ) 20 MG capsule, Take 1 capsule (20 mg total) by mouth daily.   olopatadine  (PATADAY ) 0.1 % ophthalmic solution, Place 1 drop into both eyes 2 (two) times daily as needed for allergies.  Review of Systems:  Review of system negative unless stated in the problem list or HPI.    Physical Exam:  Vitals:   02/24/24 0953  BP: (!) 135/93  Pulse: 66  Temp: 98 F (36.7 C)  TempSrc: Oral  SpO2: 96%  Weight: (!) 407 lb (184.6 kg)  Height: 5\' 8"  (1.727 m)   Physical Exam General: NAD HENT: NCAT Lungs: CTAB, no wheeze, rhonchi or rales.  Cardiovascular: Normal heart sounds, no r/m/g, 2+ pulses in all extremities. No LE edema Abdomen: No TTP, normal bowel sounds MSK: No asymmetry or muscle  atrophy.  Skin: no lesions noted on exposed skin Neuro: Alert and oriented x4. CN grossly intact Psych: Normal mood and normal affect   Assessment & Plan:   Anxiety and depression Patient presents for follow-up on her mood.  She has a caregiver with her in the room.  The caregiver was concerned that patient is quite depressed and does not take her medications due to the depression.  Her PHQ-9 is elevated at 22.  Patient denies any SI or HI.  She endorses low motivation and states does not take the medications as she thinks that did not work.  Advised the patient that these medications take a while to work and she should adhere to them to see the best benefits.  Discussed other interventions that can improve her mood including picking up a hobby, going out for walks, continuing to get therapy.  Patient's care giver would like to get a referral to psychiatry.  I discussed with them that I can place the referral to psychiatry but these interventions must be followed that she is under improvement.  At follow-up visit we can check vitamin D level as low vitamin D level have been linked to depressed mood.  For now continue fluoxetine  20 mg daily, continue therapy, and referral to psychiatry.  ADHD Patient reports she has ADHD and would like to restart her Adderall.  After reviewing her chart and looking at her  medication dispense history, I did not see any dispenses of Adderall.  I discussed with her that this needs to be evaluated by psychiatry and they can make a determination if she has ADHD and prescribe the necessary medications.   See Encounters Tab for problem based charting.  Patient Discussed with Dr. Versa Gore, MD Tommas Fragmin. [facility] Internal Medicine Residency, PGY-3

## 2024-02-24 ENCOUNTER — Telehealth: Payer: Self-pay | Admitting: Family Medicine

## 2024-02-24 ENCOUNTER — Ambulatory Visit (INDEPENDENT_AMBULATORY_CARE_PROVIDER_SITE_OTHER): Payer: MEDICAID | Admitting: Internal Medicine

## 2024-02-24 ENCOUNTER — Ambulatory Visit: Payer: Self-pay | Admitting: Internal Medicine

## 2024-02-24 VITALS — BP 135/93 | HR 66 | Temp 98.0°F | Ht 68.0 in | Wt >= 6400 oz

## 2024-02-24 DIAGNOSIS — J309 Allergic rhinitis, unspecified: Secondary | ICD-10-CM

## 2024-02-24 DIAGNOSIS — F909 Attention-deficit hyperactivity disorder, unspecified type: Secondary | ICD-10-CM

## 2024-02-24 DIAGNOSIS — F32A Depression, unspecified: Secondary | ICD-10-CM | POA: Diagnosis not present

## 2024-02-24 DIAGNOSIS — F419 Anxiety disorder, unspecified: Secondary | ICD-10-CM | POA: Diagnosis not present

## 2024-02-24 MED ORDER — OLOPATADINE HCL 0.1 % OP SOLN: 1.0000 [drp] | Freq: Two times a day (BID) | OPHTHALMIC | 12 refills | Status: AC | PRN

## 2024-02-24 NOTE — Telephone Encounter (Signed)
 [id number] (Dx [id number]) APPROVED thru 03/23/2024, ref. #[id number]; per [employer] Rep. Shanice M pcr #[id number].   02/22/2024 (rym)  [id number] tracking number  Called back to P2P line  [id number] authorization number , had been approved through 6/18 on re-review previously. Should be ok to schedule

## 2024-02-24 NOTE — Telephone Encounter (Signed)
 Good Morning Dr. Elester Grim, This patient needs for you to call the insurance one more time and please inform them that she needs the MRI done one more time. As per patient insurance only gave her a week to get the ultrasound done and was scheduled past the week.

## 2024-02-24 NOTE — Patient Instructions (Signed)
 [name], it was a pleasure seeing you today! You endorsed feeling well today. Below are some of the things we talked about this visit. We look forward to seeing you in the follow up appointment!  Today we discussed: Continue taking your medications. I am referring you to psychiatry.   Try to increase your activity level, and take short walks as they will improve your mood as well.     I have ordered the following labs today:  Lab Orders  No laboratory test(s) ordered today      Referrals ordered today:    Referral Orders         Ambulatory referral to Psychiatry      I have ordered the following medication/changed the following medications:   Stop the following medications: Medications Discontinued During This Encounter  Medication Reason   oseltamivir  (TAMIFLU ) 75 MG capsule Patient has not taken in last 30 days   olopatadine  (PATADAY ) 0.1 % ophthalmic solution Reorder   hydrOXYzine  (ATARAX ) 10 MG tablet Discontinued by provider     Start the following medications: Meds ordered this encounter  Medications   olopatadine  (PATADAY ) 0.1 % ophthalmic solution    Sig: Place 1 drop into both eyes 2 (two) times daily as needed for allergies.    Dispense:  5 mL    Refill:  12     Follow-up: 1 month follow up   Please make sure to arrive 15 minutes prior to your next appointment. If you arrive late, you may be asked to reschedule.   We look forward to seeing you next time. Please call our clinic at [phone] if you have any questions or concerns. The best time to call is Monday-Friday from 9am-4pm, but there is someone available 24/7. If after hours or the weekend, call the main hospital number and ask for the Internal Medicine Resident On-Call. If you need medication refills, please notify your pharmacy one week in advance and they will send us  a request.  Thank you for letting us  take part in your care. Wishing you the best!  Thank you, Jackolyn Masker, MD

## 2024-02-25 NOTE — Progress Notes (Signed)
 Internal Medicine Clinic Attending  Case discussed with the resident at the time of the visit.  We reviewed the resident's history and exam and pertinent patient test results.  I agree with the assessment, diagnosis, and plan of care documented in the resident's note.

## 2024-02-26 DIAGNOSIS — F909 Attention-deficit hyperactivity disorder, unspecified type: Secondary | ICD-10-CM | POA: Insufficient documentation

## 2024-02-26 NOTE — Assessment & Plan Note (Addendum)
 Patient reports she has ADHD and would like to restart her Adderall.  After reviewing her chart and looking at her medication dispense history, I did not see any dispenses of Adderall.  I discussed with her that this needs to be evaluated by psychiatry and they can make a determination if she has ADHD and prescribe the necessary medications.

## 2024-02-26 NOTE — Assessment & Plan Note (Addendum)
 Patient presents for follow-up on her mood.  She has a caregiver with her in the room.  The caregiver was concerned that patient is quite depressed and does not take her medications due to the depression.  Her PHQ-9 is elevated at 22.  Patient denies any SI or HI.  She endorses low motivation and states does not take the medications as she thinks that did not work.  Advised the patient that these medications take a while to work and she should adhere to them to see the best benefits.  Discussed other interventions that can improve her mood including picking up a hobby, going out for walks, continuing to get therapy.  Patient's care giver would like to get a referral to psychiatry.  I discussed with them that I can place the referral to psychiatry but these interventions must be followed that she is under improvement.  At follow-up visit we can check vitamin D level as low vitamin D level have been linked to depressed mood.  For now continue fluoxetine  20 mg daily, continue therapy, and referral to psychiatry.

## 2024-03-09 ENCOUNTER — Telehealth: Payer: Self-pay | Admitting: Family Medicine

## 2024-03-09 NOTE — Telephone Encounter (Signed)
 Patient is requesting a prior Auth for an MRI ,  her care giver Kathryn Andrews she spoke to maybe you or someone today about this matter   Kathryn Andrews [phone])

## 2024-03-11 ENCOUNTER — Encounter: Payer: Self-pay | Admitting: Obstetrics and Gynecology

## 2024-03-14 ENCOUNTER — Telehealth: Payer: Self-pay | Admitting: Lactation Services

## 2024-03-14 NOTE — Telephone Encounter (Signed)
 Called Radiology to schedule MRI, spoke with Peggy. Scheduled for June 12 at 2 pm at [facility]. Need to arrive at 1:30 at [facility].   PA confirmation in chart. Will respond to patient via Mychart.

## 2024-03-17 ENCOUNTER — Other Ambulatory Visit (HOSPITAL_COMMUNITY): Payer: MEDICAID

## 2024-03-18 ENCOUNTER — Ambulatory Visit (HOSPITAL_COMMUNITY)
Admission: RE | Admit: 2024-03-18 | Discharge: 2024-03-18 | Disposition: A | Discharge status: Home / Self Care | Payer: MEDICAID | Source: Ambulatory Visit | Attending: Obstetrics and Gynecology | Admitting: Obstetrics and Gynecology

## 2024-03-18 DIAGNOSIS — R7989 Other specified abnormal findings of blood chemistry: Secondary | ICD-10-CM | POA: Diagnosis present

## 2024-03-18 MED ORDER — GADOBUTROL 1 MMOL/ML IV SOLN
10.0000 mL | Freq: Once | INTRAVENOUS | Status: AC | PRN
  Administered 2024-03-18: 10 mL via INTRAVENOUS

## 2024-03-21 ENCOUNTER — Encounter: Payer: Self-pay | Admitting: *Deleted

## 2024-03-23 ENCOUNTER — Encounter: Payer: Self-pay | Admitting: Student

## 2024-03-24 ENCOUNTER — Ambulatory Visit: Payer: MEDICAID | Admitting: Licensed Clinical Social Worker

## 2024-03-24 DIAGNOSIS — F419 Anxiety disorder, unspecified: Secondary | ICD-10-CM

## 2024-03-24 DIAGNOSIS — F32A Depression, unspecified: Secondary | ICD-10-CM

## 2024-03-24 NOTE — BH Specialist Note (Signed)
 Integrated Behavioral Health via Telemedicine Visit  03/24/2024 Glada Wickstrom [id number]  Number of Integrated Behavioral Health Clinician visits: Additional Visit  Session Start time: 1000   Session End time: 1038  Total time in minutes: 38    Referring Provider: PCP Patient/Family location: Home [facility] Provider location: Office All persons participating in visit: [facility] and Patient Types of Service: Individual psychotherapy and Telephone visit  I connected with Charlotte Weintraub  Video Enabled Telemedicine Application  (Video is Caregility application) and verified that I am speaking with the correct person using two identifiers. Discussed confidentiality: Yes   I discussed the limitations of telemedicine and the availability of in person appointments.  Discussed there is a possibility of technology failure and discussed alternative modes of communication if that failure occurs.  I discussed that engaging in this telemedicine visit, they consent to the provision of behavioral healthcare and the services will be billed under their insurance.  Patient and/or legal guardian expressed understanding and consented to Telemedicine visit: Yes   Presenting Concerns: Patient and/or family reports the following symptoms/concerns:   LCSW A conducted a telehealth session today via audio only using a HIPAA compliant platform. Patient presented in good spirits and shared recent updates regarding both her physical health and personal life. She reported ongoing struggles with severe allergies and expressed the need to follow up with her doctor for stronger medication or treatment options.  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 She reported that the children were a handful but overall described the time spent as fun and rewarding. The 30 year old was described as highly independent, capable of cooking simple meals  such as noodles and eggs, and both children are able to shower and care for themselves with minimal guidance. Patient engaged them in indoor and outdoor play, including games and Roblox, and noted how much the children enjoyed her attention and energy.  She also shared that a friend recently returned from [place] and brought her back sea shells, which was a thoughtful and meaningful gesture. Patient appeared emotionally uplifted when discussing these interactions and activities. LCSW A and patient reflected on the emotional benefits of meaningful connection, routine, and joy in caregiving moments.  During the session, LCSW A completed and updated the PHQ9 and GAD7 assessments. A noticeable decrease in both depression and anxiety symptoms was recorded, and the patient expressed agreement that she has made significant progress in her mental health journey.  The session concluded with the scheduling of the next visit. Patient shared her ongoing plan to continue engaging in self care practices and implementing coping skills, such as maintaining social connections, doing activities that bring joy, and staying physically active. Duration of problem: More than six months; Severity of problem: mild  Patient and/or Family's Strengths/Protective Factors: Sense of purpose  Goals Addressed: Patient will:  Reduce symptoms of: anxiety and depression   Increase knowledge and/or ability of: coping skills, healthy habits, and self-management skills   Demonstrate ability to: Increase healthy adjustment to current life circumstances  Progress towards Goals: Ongoing    Interventions: Interventions utilized:  Solution-Focused Strategies, CBT Cognitive Behavioral Therapy, and Supportive Counseling Standardized Assessments completed: PHQ-SADS    03/24/2024   10:29 AM 03/24/2024   10:28 AM 02/24/2024    9:59 AM  PHQ-SADS Last 3 Score only  Total GAD-7 Score  8 19  PHQ Adolescent Score 14  Patient and/or Family Response: Patient agrees counseling sessions have been beneficial.  Clinical Assessment/Diagnosis  Anxiety and depression    Assessment: Patient currently experiencing Anxiety and Depression.   Patient may benefit from Ongoing Individual Counseling.  Plan: Follow up with behavioral health clinician on : 07/08; Video Visit at 10 am  I discussed the assessment and treatment plan with the patient and/or parent/guardian. They were provided an opportunity to ask questions and all were answered. They agreed with the plan and demonstrated an understanding of the instructions.   They were advised to call back or seek an in-person evaluation if the symptoms worsen or if the condition fails to improve as anticipated.  Amie Bald, MSW, LCSW-A She/Her Behavioral Health Clinician [facility]

## 2024-03-31 ENCOUNTER — Ambulatory Visit: Payer: MEDICAID | Admitting: Student

## 2024-03-31 ENCOUNTER — Encounter: Payer: Self-pay | Admitting: Student

## 2024-03-31 VITALS — BP 126/85 | HR 67 | Temp 97.7°F | Ht 68.0 in | Wt >= 6400 oz

## 2024-03-31 DIAGNOSIS — F909 Attention-deficit hyperactivity disorder, unspecified type: Secondary | ICD-10-CM

## 2024-03-31 DIAGNOSIS — R109 Unspecified abdominal pain: Secondary | ICD-10-CM

## 2024-03-31 MED ORDER — LIDOCAINE 5 % EX OINT: 1.0000 | TOPICAL_OINTMENT | CUTANEOUS | 0 refills | Status: AC | PRN

## 2024-03-31 NOTE — Patient Instructions (Addendum)
 Thank you, [name], for allowing us  to provide your care today. Today we discussed . . .  > Abdominal pain       - I would like you to start using lidocaine  gel or cream over this area as needed.  If you develop worsening pain, associated fevers, nausea, vomiting, diarrhea, constipation, bloody stools, dark black stools, or other worrisome symptoms please let us  know right away. > ADHD       - Please contact the number below to schedule a visit to start therapy for ADHD.  They may have to do retesting before prescribing this medication.  [facility] Phone [phone] Address [address] [place], [place] [address]    Follow up: 3 months    Remember:  Should you have any questions or concerns please call the internal medicine clinic at [phone].     Fairy Pool, DO [facility]

## 2024-03-31 NOTE — Progress Notes (Signed)
   CC: Acute Concern of abdominal pain  HPI:  Kathryn Andrews is a 28 y.o. female with pertinent PMH of anxiety/depression, ADHD, class III obesity, and IBS who presents as above. Please see assessment and plan below for further details.  Medications: Current Outpatient Medications  Medication Instructions   albuterol  (VENTOLIN  HFA) 108 (90 Base) MCG/ACT inhaler 1-2 puffs, Inhalation, Every 6 hours PRN   FLUoxetine  (PROZAC ) 20 mg, Oral, Daily   lidocaine  (XYLOCAINE ) 5 % ointment 1 Application, Topical, As needed   loratadine  (CLARITIN ) 10 mg, Oral, Daily   olopatadine  (PATADAY ) 0.1 % ophthalmic solution 1 drop, Both Eyes, 2 times daily PRN     Review of Systems:   Pertinent items noted in HPI and/or A&P.  Physical Exam:  Vitals:   03/31/24 1541  BP: 126/85  Pulse: 67  Temp: 97.7 F (36.5 C)  TempSrc: Oral  SpO2: 93%  Weight: (!) 401 lb 9.6 oz (182.2 kg)  Height: 5' 8 (1.727 m)    Constitutional: Well-appearing adult female. In no acute distress. HEENT: Normocephalic, atraumatic, Sclera non-icteric, PERRL, EOM intact Cardio:Regular rate and rhythm. 2+ bilateral radial pulses. Pulm:Clear to auscultation bilaterally. Normal work of breathing on room air. Abdomen: Soft, mild right lower quadrant tenderness with overlying mild warmth, no rebound tenderness non-distended, positive bowel sounds. FDX:Wzhjupcz for extremity edema. Skin:Warm and dry. Neuro:Alert and oriented x3. No focal deficit noted. Psych:Pleasant mood and affect.   Assessment & Plan:   Abdominal wall pain Presents with 1 week of stabbing right lower quadrant abdominal pain without any triggering event.  There has been some increase in the hardness of her stool but otherwise she denies melena, hematochezia, pain similar to prior IBS, fevers, nausea, vomiting, or other worrisome symptoms.  The pain did briefly moved to her right leg and when it was went outside she fell down and landed on her right side.  On  exam there is some mild tenderness to palpation over the right lower quadrant with some increased warmth but no underlying pain to deep palpation, rebound tenderness, palpable mass or collection.  On my assessment she is having abdominal wall muscular pain and we discussed signs and symptoms to watch for and strict return precautions but will treat symptomatically at this point. - Lidocaine  gel as needed  ADHD Patient given the information for the psychiatry office that picked up a referral and they will call to get her appointments scheduled.    Patient discussed with Dr. Mliss Foot  Fairy Pool, DO Internal Medicine Center Internal Medicine Resident PGY-2 Clinic Phone: [phone] Please contact the on call pager at [phone] for any urgent or emergent needs.

## 2024-04-01 DIAGNOSIS — R109 Unspecified abdominal pain: Secondary | ICD-10-CM | POA: Insufficient documentation

## 2024-04-01 NOTE — Assessment & Plan Note (Signed)
 Presents with 1 week of stabbing right lower quadrant abdominal pain without any triggering event.  There has been some increase in the hardness of her stool but otherwise she denies melena, hematochezia, pain similar to prior IBS, fevers, nausea, vomiting, or other worrisome symptoms.  The pain did briefly moved to her right leg and when it was went outside she fell down and landed on her right side.  On exam there is some mild tenderness to palpation over the right lower quadrant with some increased warmth but no underlying pain to deep palpation, rebound tenderness, palpable mass or collection.  On my assessment she is having abdominal wall muscular pain and we discussed signs and symptoms to watch for and strict return precautions but will treat symptomatically at this point. - Lidocaine  gel as needed

## 2024-04-01 NOTE — Assessment & Plan Note (Signed)
 Patient given the information for the psychiatry office that picked up a referral and they will call to get her appointments scheduled.

## 2024-04-04 NOTE — Progress Notes (Signed)
 Internal Medicine Clinic Attending  Case discussed with the resident at the time of the visit.  We reviewed the resident's history and exam and pertinent patient test results.  I agree with the assessment, diagnosis, and plan of care documented in the resident's note.

## 2024-04-12 ENCOUNTER — Ambulatory Visit: Payer: MEDICAID | Admitting: Licensed Clinical Social Worker

## 2024-04-12 DIAGNOSIS — F32A Depression, unspecified: Secondary | ICD-10-CM

## 2024-04-12 DIAGNOSIS — F419 Anxiety disorder, unspecified: Secondary | ICD-10-CM

## 2024-04-12 NOTE — BH Specialist Note (Signed)
 Patient no-showed today's appointment; appointment was for Telehealth video visit at 10:00 am  Patient will need to reschedule appointment by calling Internal medicine center [phone].  Kathryn Bennett HEDWIG VADA, LCSW-A She/Her Behavioral Health Clinician [facility]  Internal Medicine Center Direct [phone]  Fax [phone] Main Office Phone: [phone]  [address], [place], [place] [address] Website: [facility] Internal Medicine [facility]  [place], [place]  [place]

## 2024-05-11 ENCOUNTER — Ambulatory Visit (INDEPENDENT_AMBULATORY_CARE_PROVIDER_SITE_OTHER): Payer: MEDICAID | Admitting: Mental Health

## 2024-05-11 DIAGNOSIS — F321 Major depressive disorder, single episode, moderate: Secondary | ICD-10-CM | POA: Diagnosis not present
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
## 2024-05-18 ENCOUNTER — Ambulatory Visit (HOSPITAL_COMMUNITY): Payer: MEDICAID | Admitting: Psychiatry

## 2024-05-27 ENCOUNTER — Ambulatory Visit: Payer: Self-pay | Admitting: Obstetrics and Gynecology

## 2024-05-27 DIAGNOSIS — R7989 Other specified abnormal findings of blood chemistry: Secondary | ICD-10-CM

## 2024-05-30 NOTE — Telephone Encounter (Signed)
-----   Message from Carter Quarry sent at 05/27/2024  4:02 PM EDT ----- Referral placed to endocrine for high prolactin. Brain Mri does not show pituitary growth ----- Message ----- From: Interface, Rad Results In Sent: 03/30/2024  12:50 PM EDT To: Carter Quarry, MD

## 2024-05-30 NOTE — Telephone Encounter (Signed)
 Called patient with result and caregiver picked up due  to patient being unavailable. DPR no signed to release information to caregiver. Informed caregiver I was unable to give results to her and asked to have patient reach back out regarding non urgent results. Caregiver verified understanding .   B'Aisha, RMA
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
## 2024-06-01 ENCOUNTER — Ambulatory Visit (INDEPENDENT_AMBULATORY_CARE_PROVIDER_SITE_OTHER): Payer: MEDICAID | Admitting: Psychiatry

## 2024-06-01 ENCOUNTER — Encounter (HOSPITAL_COMMUNITY): Payer: Self-pay | Admitting: Psychiatry

## 2024-06-01 DIAGNOSIS — F419 Anxiety disorder, unspecified: Secondary | ICD-10-CM | POA: Diagnosis not present

## 2024-06-01 DIAGNOSIS — F32A Depression, unspecified: Secondary | ICD-10-CM | POA: Diagnosis not present

## 2024-06-01 MED ORDER — HYDROXYZINE HCL 25 MG PO TABS: 25.0000 mg | ORAL_TABLET | Freq: Two times a day (BID) | ORAL | 1 refills | Status: AC | PRN

## 2024-06-01 MED ORDER — FLUOXETINE HCL 20 MG PO CAPS: 20.0000 mg | ORAL_CAPSULE | Freq: Every day | ORAL | 0 refills | Status: DC

## 2024-06-01 NOTE — Patient Instructions (Signed)
 Thank you for attending your appointment today.  -- Continue Prozac  20 mg daily; set an alarm to remind you to take this medication each day -- START Atarax  25 mg up to twice daily as needed for anxiety -- Continue other medications as prescribed. -- Reduce and stop use of THC as this may be contributing to inattention and will interfere with ADHD evaluation  Please do not make any changes to medications without first discussing with your provider. If you are experiencing a psychiatric emergency, please call 911 or present to your nearest emergency department. Additional crisis, medication management, and therapy resources are included below.  [facility]  [address] [place], [place] [address] [phone] WALK-IN URGENT CARE 24/7 FOR ANYONE [address], [place], [place]  [phone] Fax: [phone] [email or web address] *Interpreters available *Accepts all insurance and uninsured for Urgent Care needs *Accepts Medicaid and uninsured for outpatient treatment (below)      ONLY FOR [facility]  Below:    Outpatient New Patient Assessment/Therapy Walk-ins:        Monday, Wednesday, and Thursday 8am until slots are full (first come, first served)                   New Patient Psychiatry/Medication Management        Monday-Friday 8am-11am (first come, first served)               For all walk-ins we ask that you arrive by 7:15am, because patients will be seen in the order of arrival.

## 2024-06-02 NOTE — Telephone Encounter (Signed)
-----   Message from Irene ONEIDA Lee, CMA sent at 05/30/2024  3:25 PM EDT -----   ----- Message ----- From: Jeralyn Crutch, MD Sent: 05/27/2024   4:02 PM EDT To: [facility] Clinical Pool  Referral placed to endocrine for high prolactin. Brain Mri does not show pituitary growth ----- Message ----- From: Interface, Rad Results In Sent: 03/30/2024  12:50 PM EDT To: Crutch Jeralyn, MD

## 2024-06-02 NOTE — Telephone Encounter (Signed)
 Called and spoke with caregiver, she reports patient does not answer her phone. She plans to see client today and will have her call the office for results.

## 2024-06-13 ENCOUNTER — Telehealth: Payer: Self-pay | Admitting: Obstetrics and Gynecology

## 2024-06-13 NOTE — Telephone Encounter (Signed)
 Received a call from grandmother stating someone has been trying to reach Kathryn Andrews for results. She is there, and grandmother would like you to give her a call while she is there to give her the results.

## 2024-06-14 NOTE — Telephone Encounter (Signed)
 Called pt and spoke with her care giver and informed her that the pt will receive a call from [facility] for f/u.  I confirmed with Ajewole, MD that pt's MRI results are negative and providers note for was a mistype.  Caregiver verbalized understanding with no further questions.   Pernell Dikes,RN  06/14/24

## 2024-06-22 ENCOUNTER — Telehealth (HOSPITAL_COMMUNITY): Payer: Self-pay

## 2024-06-22 NOTE — Telephone Encounter (Signed)
 Per grandmother, patient hasn't  been forthcoming during appointments. Pt also has not taken her medications. Pt grandmother she is the caregiver. Documents in the chart to support this claim. A call was returned to grandmother, no answer. A voicemail was left.

## 2024-06-24 ENCOUNTER — Telehealth (HOSPITAL_COMMUNITY): Payer: Self-pay | Admitting: Psychiatry
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
## 2024-06-30 ENCOUNTER — Other Ambulatory Visit (HOSPITAL_COMMUNITY): Payer: Self-pay | Admitting: Psychiatry

## 2024-07-01 ENCOUNTER — Ambulatory Visit (INDEPENDENT_AMBULATORY_CARE_PROVIDER_SITE_OTHER): Payer: MEDICAID | Admitting: Mental Health

## 2024-07-01 ENCOUNTER — Encounter (HOSPITAL_COMMUNITY): Payer: Self-pay

## 2024-07-01 DIAGNOSIS — F321 Major depressive disorder, single episode, moderate: Secondary | ICD-10-CM

## 2024-07-01 DIAGNOSIS — F32A Depression, unspecified: Secondary | ICD-10-CM

## 2024-07-01 NOTE — Progress Notes (Signed)
   THERAPIST PROGRESS NOTE Virtual Visit via Video Note  I connected with Kathryn Andrews on 07/01/24 at  9:00 AM EDT by a video enabled telemedicine application and verified that I am speaking with the correct person using two identifiers.  Location: Patient: [facility]  Provider: remote office   I discussed the limitations of evaluation and management by telemedicine and the availability of in person appointments. The patient expressed understanding and agreed to proceed.  I discussed the assessment and treatment plan with the patient. The patient was provided an opportunity to ask questions and all were answered. The patient agreed with the plan and demonstrated an understanding of the instructions.   The patient was advised to call back or seek an in-person evaluation if the symptoms worsen or if the condition fails to improve as anticipated.  I provided 53 minutes of non-face-to-face time during this encounter.   Kathryn Andrews, [facility]   Session Time: 9:03 am   Participation Level: Minimal  Behavioral Response: CasualAlertBlunted  Type of Therapy: Individual Therapy  Treatment Goals addressed: STG: Overthinking. Kathryn Andrews will decrease sxs of anxiety AEB  use of x 3 coping skills for anxiety with ability to reframe anxious thoughts as needed within the next 90 days.   ProgressTowards Goals: Initial  Interventions: CBT and Supportive  Summary: Kathryn Andrews is a 28 y.o. female who presents with dx of major depression and anxiety. Presents for virtual session alert and orineted; mood and affect low but stable. Speech clear and cohernet at low rate and tone. Engaged and coopeartive to session. Shares chief complaint of overhtinking. Shares for depression to be minimal here and there and medications are supportive of this. Notes thoughts on lackign confidence with anxiety in social situations in which she does not engage to a high degree with  others outside of the home. Notes to live with grand-mother and helps with her caretaking. Shares in social situations can feel as iothers are looking at her or judging her with thoughts to frequently be negative. Engages with therapist  in exploring distortionsin thoughts and identifies with presence of distorted thoughts. Engages in treatment plan and agrees. Denies safety concerns.   Suicidal/Homicidal: Nowithout intent/plan  Therapist Response: Therapist engaged Kathryn Andrews in tele-therapy session. Completed check in and assessed for current level of functioning, sxs management and level of stressors. Reviewed intake session, bounds of confidentiality and informed consent. Provided safe space for Kathryn Andrews to share concerns and explored desire to engage in therapy and explores areas of support. Engaged in psycho-education on distorted thoughts and connection of thoughts, feelings and behaviors with anxiety as well as role avoidance plays in anxiety. Engaged in treatment planning and reviewed session. Provided follow up.   Plan: Return again in  x 5 weeks.  Diagnosis: Moderate major depression (HCC)  Anxiety and depression  Collaboration of Care: Other None  Patient/Guardian was advised Release of Information must be obtained prior to any record release in order to collaborate their care with an outside provider. Patient/Guardian was advised if they have not already done so to contact the registration department to sign all necessary forms in order for us  to release information regarding their care.   Consent: Patient/Guardian gives verbal consent for treatment and assignment of benefits for services provided during this visit. Patient/Guardian expressed understanding and agreed to proceed.   Kathryn Bernice [place], [facility] 07/01/2024

## 2024-07-04 ENCOUNTER — Ambulatory Visit (HOSPITAL_COMMUNITY): Payer: Self-pay | Admitting: Mental Health

## 2024-07-21 ENCOUNTER — Encounter: Payer: Self-pay | Admitting: Student

## 2024-07-21 ENCOUNTER — Ambulatory Visit: Payer: MEDICAID | Admitting: Student

## 2024-07-21 ENCOUNTER — Other Ambulatory Visit: Payer: Self-pay

## 2024-07-21 VITALS — BP 129/79 | HR 66 | Temp 97.4°F | Ht 68.0 in | Wt 396.6 lb

## 2024-07-21 DIAGNOSIS — M25551 Pain in right hip: Secondary | ICD-10-CM

## 2024-07-21 DIAGNOSIS — Z6841 Body Mass Index (BMI) 40.0 and over, adult: Secondary | ICD-10-CM

## 2024-07-21 DIAGNOSIS — E66813 Obesity, class 3: Secondary | ICD-10-CM | POA: Diagnosis not present

## 2024-07-21 DIAGNOSIS — M545 Low back pain, unspecified: Secondary | ICD-10-CM | POA: Diagnosis not present

## 2024-07-21 DIAGNOSIS — Z72821 Inadequate sleep hygiene: Secondary | ICD-10-CM

## 2024-07-21 MED ORDER — ZEPBOUND 2.5 MG/0.5ML ~~LOC~~ SOAJ: 2.5000 mg | SUBCUTANEOUS | 0 refills | Status: DC

## 2024-07-21 NOTE — Progress Notes (Signed)
 CC: Acute Concern of acute on chronic right hip pain  HPI:  Kathryn Andrews is a 28 y.o. female with pertinent PMH of MDD, PTSD, reported ADHD diagnosed in childhood, THC use, class III obesity, secondary oligomenorrhea with hyperprolactinemia, IBS, asthma without PFTs who presents as above. Please see assessment and plan below for further details.  Medications: Current Outpatient Medications  Medication Instructions   albuterol  (VENTOLIN  HFA) 108 (90 Base) MCG/ACT inhaler 1-2 puffs, Inhalation, Every 6 hours PRN   FLUoxetine  (PROZAC ) 20 mg, Oral, Daily   hydrOXYzine  (ATARAX ) 25 mg, Oral, 2 times daily PRN   lidocaine  (XYLOCAINE ) 5 % ointment 1 Application, Topical, As needed   loratadine  (CLARITIN ) 10 mg, Oral, Daily   olopatadine  (PATADAY ) 0.1 % ophthalmic solution 1 drop, Both Eyes, 2 times daily PRN   Zepbound 2.5 mg, Subcutaneous, Weekly     Review of Systems:   Pertinent items noted in HPI and/or A&P.  Physical Exam:  Vitals:   07/21/24 1406 07/21/24 1417  BP: 90/68 129/79  Pulse: 66 66  Temp: (!) 97.4 F (36.3 C)   TempSrc: Oral   SpO2: 97%   Weight: (!) 396 lb 9.6 oz (179.9 kg)   Height: 5' 8 (1.727 m)     Constitutional: Obese young adult female. In no acute distress. Pulm: Normal work of breathing on room air. FDX:Wzhjupcz for extremity edema.  Mild tenderness to palpation in the lateral hip over the greater trochanter Skin:Warm and dry. Neuro:Alert and oriented x3. No focal deficit noted. Psych:Pleasant mood and affect.   Assessment & Plan:   Assessment & Plan Right hip pain Patient comes in to discuss chronic right hip pain it has been a little bit worse recently.  Pain is worse with walking and improves with rest.  She denies any weakness, sciatic pain, or other more concerning symptoms.  She does have some mild intermittent low back pain.  No recent injury or trauma.  Discussed the likelihood of her chronic obesity playing a role and she is interested  in addressing this. - Right hip x-ray - Referral to nutritionist, exercise program, and start tirzepatide 2.5 mg weekly Class 3 severe obesity with serious comorbidity and body mass index (BMI) of 60.0 to 69.9 in adult, unspecified obesity type (HCC) As above she has right hip pain that is concerning for arthritis due to chronic obesity.  She also snores at night and has daytime fatigue.  Her sleep hygiene is poor. - Discussed sleep hygiene improvements and other lifestyle modifications - Referral for sleep study  Orders Placed This Encounter  Procedures   XR HIP UNILAT W OR W/O PELVIS 2-3 VIEWS RIGHT    Reason for Exam (SYMPTOM  OR DIAGNOSIS REQUIRED):   right hip pain    Is the patient pregnant?:   No    Preferred imaging location?:   [facility]   Ambulatory referral to Physical Therapy    Referral Priority:   Routine    Referral Type:   Physical Medicine    Referral Reason:   Specialty Services Required    Requested Specialty:   Physical Therapy    Number of Visits Requested:   1   Amb Referral To Provider Referral Exercise Program (P.R.E.P)    Referral Priority:   Routine    Referral Type:   Consultation    Number of Visits Requested:   1   Referral to Nutrition and Diabetes Services    Referral Priority:   Routine  Referral Type:   Consultation    Referral Reason:   Specialty Services Required    Referred to Provider:   Plyler, Arland HERO, RD    Number of Visits Requested:   1   Ambulatory referral to Sleep Studies    Referral Priority:   Routine    Referral Type:   Consultation    Referral Reason:   Specialty Services Required    Number of Visits Requested:   1     Return in about 3 months (around 10/21/2024) for Follow up exercise program, nutrition, and hip pain.   Patient discussed with Dr. MICAEL Riis Winfrey  Fairy Pool, DO [facility] Internal Medicine Resident PGY-3 Clinic Phone: [phone] Please contact the on call pager at  [phone] for any urgent or emergent needs.

## 2024-07-21 NOTE — Patient Instructions (Addendum)
 Thank you, [name], for allowing us  to provide your care today.  We are going to send you for a few tests and referrals for an exercise program and physical therapy.  I have sent an order for a hip x-ray to see if there is any reason we need to think about sending you to an orthopedic doctor.  I have sent a referral for sleep study, physical therapy, exercise program, follow-up with our nutritionist here in the clinic, and I have ordered Zepbound at the starting dose of 2.5 mg weekly.  Please let me know if you have difficulty picking up this medication and we can try to send in [facility] or you can also think about the self-pay option through [facility] which is $350 a month for the 2.5 mg dose and then $500 a month for the 5 mg dose and above.  I have attached some information about the Mediterranean diet which a lot of people have had success using to healthily lose weight.  We will plan to see you back in 3 months and hopefully you will have had the sleep study, seen our nutritionist, been able to start on the medicine, started the exercise program, and seen physical therapy by that time.   Referrals ordered today:   Referral Orders         Ambulatory referral to Physical Therapy         Amb Referral To Provider Referral Exercise Program (P.R.E.P)         Referral to Nutrition and Diabetes Services         Ambulatory referral to Sleep Studies        Follow up: 3 months    Remember:  Should you have any questions or concerns please call the internal medicine clinic at [phone].     Fairy Pool, DO [facility]

## 2024-07-24 NOTE — Assessment & Plan Note (Signed)
 As above she has right hip pain that is concerning for arthritis due to chronic obesity.  She also snores at night and has daytime fatigue.  Her sleep hygiene is poor. - Discussed sleep hygiene improvements and other lifestyle modifications - Referral for sleep study

## 2024-07-25 NOTE — Progress Notes (Signed)
 Internal Medicine Clinic Attending  Case discussed with the resident at the time of the visit.  We reviewed the resident's history and exam and pertinent patient test results.  I agree with the assessment, diagnosis, and plan of care documented in the resident's note.

## 2024-07-28 ENCOUNTER — Other Ambulatory Visit: Payer: Self-pay | Admitting: Student

## 2024-07-28 DIAGNOSIS — Z6841 Body Mass Index (BMI) 40.0 and over, adult: Secondary | ICD-10-CM

## 2024-08-02 ENCOUNTER — Telehealth: Payer: Self-pay

## 2024-08-02 DIAGNOSIS — Z6841 Body Mass Index (BMI) 40.0 and over, adult: Secondary | ICD-10-CM

## 2024-08-02 DIAGNOSIS — M25551 Pain in right hip: Secondary | ICD-10-CM
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
## 2024-08-02 NOTE — Telephone Encounter (Signed)
 Copied from CRM [id number]. Topic: Clinical - Medication Question >> Aug 02, 2024  1:10 PM Suzette B wrote: Reason for CRM: Naileah Karg [phone] grandmother of patient is wanting to know exactly how to give the patient the medication tirzepatide (ZEPBOUND) 2.5 MG/0.5ML Pen, please call to advise. Ms. Katrisha Segall stated if she doesn't answer right away please leave a message and she will call back. She is partially blind so she does ask permission to record the information so she can set it as a reminder

## 2024-08-03 ENCOUNTER — Encounter: Payer: Self-pay | Admitting: Student

## 2024-08-03 ENCOUNTER — Telehealth (INDEPENDENT_AMBULATORY_CARE_PROVIDER_SITE_OTHER): Payer: MEDICAID | Admitting: Psychiatry

## 2024-08-03 ENCOUNTER — Encounter (HOSPITAL_COMMUNITY): Payer: Self-pay | Admitting: Psychiatry

## 2024-08-03 DIAGNOSIS — Z8659 Personal history of other mental and behavioral disorders: Secondary | ICD-10-CM | POA: Diagnosis not present

## 2024-08-03 DIAGNOSIS — F321 Major depressive disorder, single episode, moderate: Secondary | ICD-10-CM | POA: Diagnosis not present

## 2024-08-03 DIAGNOSIS — F4310 Post-traumatic stress disorder, unspecified: Secondary | ICD-10-CM | POA: Diagnosis not present

## 2024-08-03 DIAGNOSIS — F4010 Social phobia, unspecified: Secondary | ICD-10-CM | POA: Diagnosis not present

## 2024-08-03 MED ORDER — FLUOXETINE HCL 20 MG PO CAPS: 20.0000 mg | ORAL_CAPSULE | Freq: Every day | ORAL | 0 refills | Status: AC

## 2024-08-03 NOTE — Patient Instructions (Signed)

## 2024-08-04 ENCOUNTER — Telehealth: Payer: Self-pay

## 2024-08-04 ENCOUNTER — Other Ambulatory Visit: Payer: Self-pay | Admitting: Student

## 2024-08-04 DIAGNOSIS — Z6841 Body Mass Index (BMI) 40.0 and over, adult: Secondary | ICD-10-CM

## 2024-08-04 DIAGNOSIS — E66813 Obesity, class 3: Secondary | ICD-10-CM

## 2024-08-04 MED ORDER — ZEPBOUND 2.5 MG/0.5ML ~~LOC~~ SOAJ
2.5000 mg | SUBCUTANEOUS | 0 refills | Status: DC
Start: 2024-08-04 — End: 2024-08-04

## 2024-08-04 MED ORDER — ZEPBOUND 2.5 MG/0.5ML ~~LOC~~ SOAJ: 2.5000 mg | SUBCUTANEOUS | 1 refills | Status: AC

## 2024-08-04 NOTE — Telephone Encounter (Signed)
 Sanah Kraska (Key: [id number]) Zepbound 2.5MG /0.5ML pen-injectors Form [employer] Electronic Prior Authorization Form Created 4 hours ago Sent to Plan 4 hours ago Plan Response 3 hours ago Submit Clinical Questions 3 hours ago Determination Unfavorable 22 minutes ago Message from [employer]. [place]  Medicaid does not cover the following service(s) in the [facility] Plan: [id number] ZEPBOUND 2.5MG /0.5ML Soln Auto-inj This drug when used for weight loss is not a covered pharmacy benefit.  I called and spoke to [facility], she is aware that the request has been denied. Edsel stated that the patient is going to try to go to healthy weight and management.

## 2024-08-04 NOTE — Addendum Note (Signed)
 Addended by: ELICIA SHARPER on: 08/04/2024 11:37 AM   Modules accepted: Orders

## 2024-08-04 NOTE — Telephone Encounter (Signed)
 Prior Authorization for patient (Zepbound 2.5MG /0.5ML pen-injectors) came through on cover my meds was submitted with last office notes awaiting approval or denial.  KEY:[id number]

## 2024-08-04 NOTE — Telephone Encounter (Signed)
 I called and spoke to the patients caregiver Kathryn Andrews stated that the patient has not received the medication yet. The pharmacy told Kathryn that they will have to see if her insurance will cover the medication- the patient may need a prior authorization. A appointment would not be beneficial right now  since she does not have the medication yet.   The rx was sent with a diagnosis of right hip pain. I will need a new rx with the appropriate diagnosis. Once a new rx is sent to the pharmacy I can submit a prior authorization. I will call the patient/ caregiver with a update.

## 2024-08-05 ENCOUNTER — Ambulatory Visit (HOSPITAL_COMMUNITY)
Admission: RE | Admit: 2024-08-05 | Discharge: 2024-08-05 | Disposition: A | Discharge status: Home / Self Care | Payer: MEDICAID | Source: Ambulatory Visit | Attending: Family Medicine | Admitting: Family Medicine

## 2024-08-05 DIAGNOSIS — M25551 Pain in right hip: Secondary | ICD-10-CM | POA: Diagnosis present

## 2024-08-05 NOTE — Telephone Encounter (Signed)
 A referral has already been sent to [facility]. We need to place a referral to healthy weight and wellness.

## 2024-08-05 NOTE — Addendum Note (Signed)
 Addended by: ELICIA SHARPER on: 08/05/2024 08:22 AM   Modules accepted: Orders

## 2024-08-08 ENCOUNTER — Ambulatory Visit (INDEPENDENT_AMBULATORY_CARE_PROVIDER_SITE_OTHER): Payer: MEDICAID | Admitting: Mental Health

## 2024-08-08 DIAGNOSIS — F321 Major depressive disorder, single episode, moderate: Secondary | ICD-10-CM | POA: Diagnosis not present

## 2024-08-08 NOTE — Progress Notes (Unsigned)
   THERAPIST PROGRESS NOTE Virtual Visit via Video Note  I connected with Kathryn Andrews on 08/08/24 at  2:00 PM EST by a video enabled telemedicine application and verified that I am speaking with the correct person using two identifiers.  Location: Patient: home address on file Provider: remote office   I discussed the limitations of evaluation and management by telemedicine and the availability of in person appointments. The patient expressed understanding and agreed to proceed.   I discussed the assessment and treatment plan with the patient. The patient was provided an opportunity to ask questions and all were answered. The patient agreed with the plan and demonstrated an understanding of the instructions.   The patient was advised to call back or seek an in-person evaluation if the symptoms worsen or if the condition fails to improve as anticipated.  I provided 26 minutes of non-face-to-face time during this encounter.   Ty Bernice Sav[place], [facility]   Session Time: 2:03 pm ( 26 minutes)  Participation Level: Minimal  Behavioral Response: Fairly GroomedAlertneutral  Type of Therapy: Individual Therapy  Treatment Goals addressed:  STG: Overthinking. Kathryn Andrews will decrease sxs of anxiety AEB  use of x 3 coping skills for anxiety with ability to reframe anxious thoughts as needed within the next 90 days.   ProgressTowards Goals: Not Progressing  Interventions: CBT and Supportive  Summary: Kathryn Andrews is a 28 y.o. female who presents with dx of major depression and anxiety. Presents for virtual session alert and orineted; mood and affect low but stable. Speech clear and cohernet at low rate and tone. Engaged and coopeartive to session. Shares chief    Suicidal/Homicidal: Nature Conservation Officer Response:  Therapist engaged Kathryn Andrews in tele-therapy session. Completed check in and assessed for current level of functioning, sxs management and level of stressors. Reviewed  intake session, bounds of confidentiality and informed consent. Provided safe space for April to share concerns and explored desire to engage in therapy and explores areas of   Plan: Return again in *** weeks.  Diagnosis: Moderate major depression (HCC)  Collaboration of Care: Other None  Patient/Guardian was advised Release of Information must be obtained prior to any record release in order to collaborate their care with an outside provider. Patient/Guardian was advised if they have not already done so to contact the registration department to sign all necessary forms in order for us  to release information regarding their care.   Consent: Patient/Guardian gives verbal consent for treatment and assignment of benefits for services provided during this visit. Patient/Guardian expressed understanding and agreed to proceed.   Ty Bernice [place], [facility] 08/08/2024

## 2024-08-09 ENCOUNTER — Telehealth: Payer: Self-pay

## 2024-08-09 NOTE — Telephone Encounter (Signed)
 Spoke with Kathryn Andrews about the PREP program and is interested in next class at [place].

## 2024-08-10 NOTE — Telephone Encounter (Signed)
 Completed  Copied from CRM #[id number]. Topic: General - Other >> Aug 05, 2024  9:04 AM Cherylann RAMAN wrote: Reason for CRM: Rock states that NT called the Nurse Aide instead of calling herself, Ms. Kathryn Andrews. Ms. Halfhill states that the aide should not be called unless it is an emergency. She states to leave a detailed msg if she does not answer the phone.

## 2024-08-15 ENCOUNTER — Ambulatory Visit: Payer: Self-pay | Admitting: Student

## 2024-08-15 NOTE — Progress Notes (Signed)
 Negative right hip x-ray, no changes to current management.

## 2024-08-17 ENCOUNTER — Encounter (INDEPENDENT_AMBULATORY_CARE_PROVIDER_SITE_OTHER): Payer: Self-pay
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
## 2024-08-29 ENCOUNTER — Telehealth: Payer: Self-pay

## 2024-08-29 NOTE — Telephone Encounter (Signed)
 Called mz:EMZE program schedule in December at [facility]; left voicemail requesting return call.

## 2024-08-30 ENCOUNTER — Encounter (HOSPITAL_COMMUNITY): Payer: MEDICAID | Admitting: Psychiatry

## 2024-09-05 ENCOUNTER — Telehealth: Payer: Self-pay

## 2024-09-05 NOTE — Telephone Encounter (Signed)
 She returned my call on her cell phone. She wants to attend next PREP class at [facility] on 1/6, every T/Th at 10am. Will contact end of December to confirm and set up assessment visit.

## 2024-09-05 NOTE — Telephone Encounter (Signed)
 Returned her call about PREP program left another voicemail asking her to call me back.

## 2024-09-27 ENCOUNTER — Telehealth: Payer: Self-pay

## 2024-09-27 NOTE — Telephone Encounter (Signed)
 Called to confirm participation in next PREP class at [facility] on 10/11/24 and set up assessment visit; left voicemail requesting return call.

## 2024-10-03 ENCOUNTER — Telehealth: Payer: Self-pay

## 2024-10-03 NOTE — Telephone Encounter (Signed)
 Called to confirm participation in next PREP class at [facility] on 10/11/24; assessment visit scheduled for 12/31 at 12 noon.

## 2024-10-10 NOTE — Progress Notes (Signed)
 [facility] PREP Evaluation  Patient Details  Name: Kathryn Andrews MRN: [id number] Date of Birth: [birth year] Age: 29 y.o. PCP: Kathryn Pac, DO  Vitals:   10/10/24 1353  BP: 120/78  Pulse: 73  SpO2: 98%  Weight: (!) 389 lb 6.4 oz (176.6 kg)     [facility] Eval - 10/10/24 1300       [facility] PREP Location   [facility] PREP Location [facility]      Referral    Referring Provider Kathryn Andrews    Reason for referral Inactivity;Obesitity/Overweight;Current Smoker    Program Start Date 10/11/24      Measurement   Waist Circumference 53.5 inches    Hip Circumference 66 inches    Body fat --   E3     Information for Trainer   Goals --   Lose 10-15 pounds by the end of the program; decrease back pain   Current Exercise --   walking, stretching   Orthopedic Concerns --   chronic back pain   Pertinent Medical History --   obesity, depressions, PTSD   Current Barriers --   transportation     Timed Up and Go (TUGS)   Timed Up and Go Low risk <9 seconds      Mobility and Daily Activities   I find it easy to walk up or down two or more flights of stairs. 4    I have no trouble taking out the trash. 4    I do housework such as vacuuming and dusting on my own without difficulty. 2    I can easily lift a gallon of milk (8lbs). 4    I can easily walk a mile. 2    I have no trouble reaching into high cupboards or reaching down to pick up something from the floor. 2    I do not have trouble doing out-door work such as loss adjuster, chartered, raking leaves, or gardening. 4      Mobility and Daily Activities   I feel younger than my age. 2    I feel independent. 4    I feel energetic. 2    I live an active life.  1    I feel strong. 1    I feel healthy. 2    I feel active as other people my age. 2      How fit and strong are you.   Fit and Strong Total Score 36         Past Medical History:  Diagnosis Date   Abdominal pain, epigastric 03/08/2021   Anxiety    Asthma    Attention deficit  hyperactivity disorder    Bloating 03/08/2021   Chest pain    Chronic constipation    Chronic diarrhea 03/08/2021   Chronic lower back pain 04/02/2020   Class 3 severe obesity without serious comorbidity with body mass index (BMI) of 60.0 to 69.9 in adult [facility]) 12/15/2021   Depression    Functional dyspepsia 02/28/2020   Generalized abdominal pain 12/15/2021   Irritable bowel syndrome with diarrhea 12/15/2021   Morbid obesity (HCC) 03/02/2020   PTSD (post-traumatic stress disorder)    Urinary frequency 04/02/2020   Past Surgical History:  Procedure Laterality Date   adenoid removal     BIOPSY  08/12/2021   Procedure: BIOPSY;  Surgeon: Wilhelmenia Aloha Raddle., MD;  [facility];  Service: Gastroenterology;;  EGD and COLON   COLONOSCOPY WITH PROPOFOL  N/A 08/12/2021   Procedure: COLONOSCOPY WITH PROPOFOL ;  Surgeon:  Mansouraty, Aloha Raddle., MD;  Location: [facility];  Service: Gastroenterology;  Laterality: N/A;   ESOPHAGOGASTRODUODENOSCOPY (EGD) WITH PROPOFOL  N/A 08/12/2021   Procedure: ESOPHAGOGASTRODUODENOSCOPY (EGD) WITH PROPOFOL ;  Surgeon: Wilhelmenia Aloha Raddle., MD;  [facility];  Service: Gastroenterology;  Laterality: N/A;   EXTERNAL EAR SURGERY     POLYPECTOMY  08/12/2021   Procedure: POLYPECTOMY;  Surgeon: Mansouraty, Aloha Raddle., MD;  [facility];  Service: Gastroenterology;;   WISDOM TOOTH EXTRACTION     Tobacco Use History[1]  To begin PREP class at [facility] on 10/11/24, every T/Th at 10 Kathryn Andrews 10/10/2024, 2:05 PM      [1]  Social History Tobacco Use  Smoking Status Former   Current packs/day: 0.25   Types: Cigarettes  Smokeless Tobacco Never  Tobacco Comments   Vapes

## 2024-10-11 NOTE — Progress Notes (Signed)
 [facility] PREP Weekly Session  Patient Details  Name: Kathryn Andrews MRN: [id number] Date of Birth: [birth year] Age: 29 y.o. PCP: Jolaine Pac, DO  There were no vitals filed for this visit.   [facility] Weekly seesion - 10/11/24 1100       [facility] PREP Location   [facility] PREP Location [facility]      Weekly Session   Topic Discussed Goal setting and welcome to the program   Introductions; program overview and review of notebook; tour of facility; offered option to work out on cardio machines.   Classes attended to date 1          Kathryn Andrews 10/11/2024, 11:37 AM

## 2024-10-12 ENCOUNTER — Ambulatory Visit (HOSPITAL_COMMUNITY): Payer: MEDICAID | Admitting: Mental Health

## 2024-10-25 NOTE — Progress Notes (Signed)
 [facility] PREP Weekly Session  Patient Details  Name: Kathryn Andrews MRN: [id number] Date of Birth: 25-Sep-1996 Age: 28 y.o. PCP: Jolaine Pac, DO  Vitals:   10/25/24 1146  Weight: (!) 383 lb 6.4 oz (173.9 kg)     [facility] Weekly seesion - 10/25/24 1100       [facility] PREP Location   [facility] PREP Location [facility]      Weekly Session   Topic Discussed Healthy eating tips   foods to decrease, foods to increase; introduced Yuka app; eat the rainbow of colors   Classes attended to date 4          Jerricka Carvey B Jaianna Nicoll 10/25/2024, 11:47 AM

## 2024-11-17 ENCOUNTER — Ambulatory Visit: Payer: Self-pay | Admitting: Dietician

## 2024-11-23 ENCOUNTER — Ambulatory Visit: Payer: MEDICAID | Admitting: "Endocrinology
